# Patient Record
Sex: Female | Born: 1955 | Race: White | Hispanic: No | Marital: Married | Smoking: Current some day smoker
Health system: Southern US, Community
[De-identification: ages and names within clinical notes are randomized; demographics above are authoritative.]

## PROBLEM LIST (undated history)

## (undated) DIAGNOSIS — I10 Essential (primary) hypertension: Secondary | ICD-10-CM

## (undated) DIAGNOSIS — F32A Depression, unspecified: Secondary | ICD-10-CM

## (undated) DIAGNOSIS — E785 Hyperlipidemia, unspecified: Secondary | ICD-10-CM

## (undated) DIAGNOSIS — F419 Anxiety disorder, unspecified: Secondary | ICD-10-CM

## (undated) DIAGNOSIS — E079 Disorder of thyroid, unspecified: Secondary | ICD-10-CM

## (undated) DIAGNOSIS — N6092 Unspecified benign mammary dysplasia of left breast: Secondary | ICD-10-CM

## (undated) DIAGNOSIS — M8580 Other specified disorders of bone density and structure, unspecified site: Secondary | ICD-10-CM

## (undated) DIAGNOSIS — F329 Major depressive disorder, single episode, unspecified: Secondary | ICD-10-CM

## (undated) DIAGNOSIS — C4491 Basal cell carcinoma of skin, unspecified: Secondary | ICD-10-CM

## (undated) DIAGNOSIS — K219 Gastro-esophageal reflux disease without esophagitis: Secondary | ICD-10-CM

## (undated) HISTORY — DX: Essential (primary) hypertension: I10

## (undated) HISTORY — PX: COLONOSCOPY: SHX174

## (undated) HISTORY — DX: Other specified disorders of bone density and structure, unspecified site: M85.80

## (undated) HISTORY — DX: Anxiety disorder, unspecified: F41.9

## (undated) HISTORY — DX: Unspecified benign mammary dysplasia of left breast: N60.92

## (undated) HISTORY — DX: Disorder of thyroid, unspecified: E07.9

## (undated) HISTORY — DX: Hyperlipidemia, unspecified: E78.5

## (undated) HISTORY — DX: Basal cell carcinoma of skin, unspecified: C44.91

## (undated) HISTORY — DX: Depression, unspecified: F32.A

## (undated) HISTORY — DX: Gastro-esophageal reflux disease without esophagitis: K21.9

## (undated) HISTORY — PX: BUNIONECTOMY: SHX129

## (undated) HISTORY — DX: Major depressive disorder, single episode, unspecified: F32.9

---

## 1980-09-23 HISTORY — PX: NASAL SEPTUM SURGERY: SHX37

## 1998-01-31 ENCOUNTER — Other Ambulatory Visit: Admission: RE | Admit: 1998-01-31 | Discharge: 1998-01-31 | Payer: Self-pay | Admitting: *Deleted

## 1999-09-14 ENCOUNTER — Other Ambulatory Visit: Admission: RE | Admit: 1999-09-14 | Discharge: 1999-09-14 | Payer: Self-pay | Admitting: Gynecology

## 2000-11-10 ENCOUNTER — Other Ambulatory Visit: Admission: RE | Admit: 2000-11-10 | Discharge: 2000-11-10 | Payer: Self-pay | Admitting: Gynecology

## 2002-04-20 ENCOUNTER — Encounter: Admission: RE | Admit: 2002-04-20 | Discharge: 2002-04-20 | Payer: Self-pay | Admitting: Orthopedic Surgery

## 2002-04-20 ENCOUNTER — Encounter: Payer: Self-pay | Admitting: Orthopedic Surgery

## 2002-08-09 ENCOUNTER — Other Ambulatory Visit: Admission: RE | Admit: 2002-08-09 | Discharge: 2002-08-09 | Payer: Self-pay | Admitting: Obstetrics and Gynecology

## 2002-11-14 ENCOUNTER — Emergency Department (HOSPITAL_COMMUNITY): Admission: EM | Admit: 2002-11-14 | Discharge: 2002-11-14 | Payer: Self-pay | Admitting: Emergency Medicine

## 2003-08-11 ENCOUNTER — Ambulatory Visit (HOSPITAL_COMMUNITY): Admission: RE | Admit: 2003-08-11 | Discharge: 2003-08-11 | Payer: Self-pay | Admitting: Family Medicine

## 2003-08-22 ENCOUNTER — Encounter: Admission: RE | Admit: 2003-08-22 | Discharge: 2003-08-22 | Payer: Self-pay | Admitting: Family Medicine

## 2003-09-08 ENCOUNTER — Ambulatory Visit (HOSPITAL_COMMUNITY): Admission: RE | Admit: 2003-09-08 | Discharge: 2003-09-08 | Payer: Self-pay | Admitting: Family Medicine

## 2003-09-08 ENCOUNTER — Encounter (INDEPENDENT_AMBULATORY_CARE_PROVIDER_SITE_OTHER): Payer: Self-pay | Admitting: Specialist

## 2004-07-02 ENCOUNTER — Other Ambulatory Visit: Admission: RE | Admit: 2004-07-02 | Discharge: 2004-07-02 | Payer: Self-pay | Admitting: Obstetrics and Gynecology

## 2004-08-02 ENCOUNTER — Encounter: Admission: RE | Admit: 2004-08-02 | Discharge: 2004-08-02 | Payer: Self-pay | Admitting: Otolaryngology

## 2004-12-28 ENCOUNTER — Encounter: Admission: RE | Admit: 2004-12-28 | Discharge: 2004-12-28 | Payer: Self-pay | Admitting: Family Medicine

## 2005-09-04 ENCOUNTER — Other Ambulatory Visit: Admission: RE | Admit: 2005-09-04 | Discharge: 2005-09-04 | Payer: Self-pay | Admitting: Obstetrics and Gynecology

## 2005-09-19 ENCOUNTER — Ambulatory Visit: Payer: Self-pay | Admitting: Internal Medicine

## 2005-09-30 ENCOUNTER — Ambulatory Visit: Payer: Self-pay | Admitting: Internal Medicine

## 2008-09-23 DIAGNOSIS — E079 Disorder of thyroid, unspecified: Secondary | ICD-10-CM

## 2008-09-23 HISTORY — DX: Disorder of thyroid, unspecified: E07.9

## 2009-05-04 ENCOUNTER — Ambulatory Visit: Payer: Self-pay | Admitting: Internal Medicine

## 2009-10-16 ENCOUNTER — Ambulatory Visit: Payer: Self-pay | Admitting: Internal Medicine

## 2009-11-02 ENCOUNTER — Ambulatory Visit: Payer: Self-pay | Admitting: Internal Medicine

## 2010-02-23 ENCOUNTER — Telehealth: Payer: Self-pay | Admitting: Internal Medicine

## 2010-04-02 ENCOUNTER — Ambulatory Visit: Payer: Self-pay | Admitting: Internal Medicine

## 2010-09-19 ENCOUNTER — Encounter (INDEPENDENT_AMBULATORY_CARE_PROVIDER_SITE_OTHER): Payer: Self-pay | Admitting: *Deleted

## 2010-09-28 ENCOUNTER — Encounter (INDEPENDENT_AMBULATORY_CARE_PROVIDER_SITE_OTHER): Payer: Self-pay | Admitting: *Deleted

## 2010-09-28 ENCOUNTER — Ambulatory Visit
Admission: RE | Admit: 2010-09-28 | Discharge: 2010-09-28 | Payer: Self-pay | Source: Home / Self Care | Attending: Internal Medicine | Admitting: Internal Medicine

## 2010-10-04 ENCOUNTER — Ambulatory Visit
Admission: RE | Admit: 2010-10-04 | Discharge: 2010-10-04 | Payer: Self-pay | Source: Home / Self Care | Attending: Internal Medicine | Admitting: Internal Medicine

## 2010-10-11 ENCOUNTER — Encounter: Payer: Self-pay | Admitting: Internal Medicine

## 2010-10-11 ENCOUNTER — Ambulatory Visit
Admission: RE | Admit: 2010-10-11 | Discharge: 2010-10-11 | Payer: Self-pay | Source: Home / Self Care | Attending: Internal Medicine | Admitting: Internal Medicine

## 2010-10-12 LAB — HM COLONOSCOPY

## 2010-10-23 NOTE — Progress Notes (Signed)
Summary: change recall col?  Phone Note Call from Patient Call back at [phone]   Caller: Patient Call For: Dr. Juanda Chance Reason for Call: Talk to Nurse Summary of Call: mother passed from colon cancer in 2009 and pt wants to know if her COL recall date should be moved up from q 5 yrs to q 1-3 yrs Initial call taken by: Vallarie Mare,  February 23, 2010 4:14 PM  Follow-up for Phone Call        Last Colon 09/2005.  Pt. denies any GI c/o at this time.   DR. PLEASE ADVISE  Follow-up by: Laureen Ochs LPN,  February 24, 3243 4:27 PM  Additional Follow-up for Phone Call Additional follow up Details #1::        Pt's last colonoscopy was in 09/2005, if her mother was diagnosed before age 38 then O.K. to do colonoscpy now, but if she was over 60 at the time of diagnosis then pt. needs to wait for 5 years as per guidelines ( the insurance would not cover that) DB    Additional Follow-up for Phone Call Additional follow up Details #2::    Above MD orders reviewed with patient. Pt's mother was in her 81's at time of diagnosis. Pt. will wait until 09/2010 for her recall Colonoscopy. Pt. instructed to call back as needed. (Recall is in IDX for  09/2010)  Follow-up by: Laureen Ochs LPN,  February 26, 101 8:30 AM

## 2010-10-23 NOTE — Procedures (Signed)
Summary: COLONOSCOPY   Colonoscopy  Procedure date:  09/30/2005  Findings:      Location:  [facility].    Patient Name: Robin, Barton MRN:  Procedure Procedures: Colonoscopy CPT: [phone].  Personnel: Endoscopist:  L. Juanda Chance, MD.  Referred By: Mosetta Putt, M.D.  Exam Location: Exam performed in Outpatient Clinic. Outpatient  Patient Consent: Procedure, Alternatives, Risks and Benefits discussed, consent obtained, from patient. Consent was obtained by the RN.  Indications Symptoms: Constipation Patient's stools are infrequent. Change in bowel habits.  Comments: s/p hemorrhoidectomy History  Current Medications: Patient is not currently taking Coumadin.  Pre-Exam Physical: Performed Sep 30, 2005. Entire physical exam was normal.  Comments: Pt. history reviewed/updated, physical exam performed prior to initiation of sedation? Exam Exam: Extent of exam reached: Cecum, extent intended: Cecum.  The cecum was identified by appendiceal orifice and IC valve. Colon retroflexion performed. Images taken. ASA Classification: I. Tolerance: good.  Monitoring: Pulse and BP monitoring, Oximetry used. Supplemental O2 given.  Colon Prep Used Miralax for colon prep. Prep results: good.  Sedation Meds: Patient assessed and found to be appropriate for moderate (conscious) sedation. Fentanyl 75 mcg. given IV. Versed 8 mg. given IV.  Findings - NORMAL EXAM: Cecum.  - NORMAL EXAM: Rectum. Comments: s/p hemorrhoidectomy.   Assessment Normal examination.  Comments: no polyps Events  Unplanned Interventions: No intervention was required.  Unplanned Events: There were no complications. Plans Patient Education: Patient given standard instructions for: Yearly hemoccult testing recommended. Patient instructed to get routine colonoscopy every 10 years.  Disposition: After procedure patient sent to recovery. After recovery patient sent home.    CC: Robin Barton  This report was created from the original endoscopy report, which was reviewed and signed by the above listed endoscopist.

## 2010-10-25 NOTE — Procedures (Signed)
Summary: Colonoscopy  Patient: Robin Barton Note: All result statuses are Final unless otherwise noted.  Tests: (1) Colonoscopy (COL)   COL Colonoscopy           DONE     [facility]     [address]     [place], [place]  [address]           COLONOSCOPY PROCEDURE REPORT           PATIENT:  Robin Barton, Robin Barton  MR#:  [id number]     BIRTHDATE:  [birth year], 54 yrs. old  GENDER:  female     ENDOSCOPIST:  Hedwig Morton. Juanda Chance, MD     REF. BY:  Sharlet Salina, M.D.     PROCEDURE DATE:  10/11/2010     PROCEDURE:  Colonoscopy 19147     ASA CLASS:  Class I     INDICATIONS:  family history of colon cancer mother with colon     cancer     last colon 09/2005     MEDICATIONS:   Versed 10 mg, Fentanyl 100 mcg           DESCRIPTION OF PROCEDURE:   After the risks benefits and     alternatives of the procedure were thoroughly explained, informed     consent was obtained.  Digital rectal exam was performed and     revealed no rectal masses.   The LB [id number]-[id number] endoscope     was introduced through the anus and advanced to the cecum, which     was identified by both the appendix and ileocecal valve, without     limitations.  The quality of the prep was good, using MiraLax.     The instrument was then slowly withdrawn as the colon was fully     examined.     <<PROCEDUREIMAGES>>           FINDINGS:  No polyps or cancers were seen (see image1, image2,     image3, image4, and image5).   Retroflexed views in the rectum     revealed no abnormalities.    The scope was then withdrawn from     the patient and the procedure completed.           COMPLICATIONS:  None     ENDOSCOPIC IMPRESSION:     1) No polyps or cancers     2) Normal colonoscopy     RECOMMENDATIONS:     1) high fiber diet     REPEAT EXAM:  In 5 year(s) for.           ______________________________     Hedwig Morton. Juanda Chance, MD           CC:  Sharlet Salina, M.D.           n.     eSIGNED:   Hedwig Morton.  at  10/11/2010 11:01 AM           Marcelino Duster, [id number]  Note: An exclamation mark (!) indicates a result that was not dispersed into the flowsheet. Document Creation Date: 10/11/2010 11:01 AM _______________________________________________________________________  (1) Order result status: Final Collection or observation date-time: 10/11/2010 10:54 Requested date-time:  Receipt date-time:  Reported date-time:  Referring Physician:   Ordering Physician: Lina Sar [phone]) Specimen Source:  Source: Launa Grill Order Number: [id number] Lab site:   Appended Document: Colonoscopy    Clinical Lists Changes  Observations: Added new observation of COLONNXTDUE: 09/2015 (10/11/2010 13:53)

## 2010-10-25 NOTE — Letter (Signed)
Summary: [facility] Instructions  [facility]  [address] [place], [place] [address]   Phone: [phone]  Fax: [phone]       Robin Barton    [birth year]    MRN: [id number]       Procedure Day /Date: Thursday 10-11-10     Arrival Time:  9:00 am     Procedure Time: 10:00 am     Location of Procedure:                    _ x_  [facility] (4th Floor)    PREPARATION FOR COLONOSCOPY WITH MIRALAX  Starting 5 days prior to your procedure  10-06-10 do not eat nuts, seeds, popcorn, corn, beans, peas,  salads, or any raw vegetables.  Do not take any fiber supplements (e.g. Metamucil, Citrucel, and Benefiber). ____________________________________________________________________________________________________   THE DAY BEFORE YOUR PROCEDURE         DATE:  10-10-10  DAY:  Wednesday  1   Drink clear liquids the entire day-NO SOLID FOOD  2   Do not drink anything colored red or purple.  Avoid juices with pulp.  No orange juice.  3   Drink at least 64 oz. (8 glasses) of fluid/clear liquids during the day to prevent dehydration and help the prep work efficiently.  CLEAR LIQUIDS INCLUDE: Water Jello Ice Popsicles Tea (sugar ok, no milk/cream) Powdered fruit flavored drinks Coffee (sugar ok, no milk/cream) Gatorade Juice: apple, white grape, white cranberry  Lemonade Clear bullion, consomm, broth Carbonated beverages (any kind) Strained chicken noodle soup Hard Candy  4   Mix the entire bottle of Miralax with 64 oz. of Gatorade/Powerade in the morning and put in the refrigerator to chill.  5   At 3:00 pm take 2 Dulcolax/Bisacodyl tablets.  6   At 4:30 pm take one Reglan/Metoclopramide tablet.  7  Starting at 5:00 pm drink one 8 oz glass of the Miralax mixture every 15-20 minutes until you have finished drinking the entire 64 oz.  You should finish drinking prep around 7:30 or 8:00 pm.  8   If you are nauseated, you may take the 2nd  Reglan/Metoclopramide tablet at 6:30 pm.        9    At 8:00 pm take 2 more DULCOLAX/Bisacodyl tablets.     THE DAY OF YOUR PROCEDURE      DATE:  10-11-10  DAY:  Thursday  You may drink clear liquids until  8:00 a.m.  (2 HOURS BEFORE PROCEDURE).   MEDICATION INSTRUCTIONS  Unless otherwise instructed, you should take regular prescription medications with a small sip of water as early as possible the morning of your procedure.  Hold Chlorthalidone morning of procedure only.         OTHER INSTRUCTIONS  You will need a responsible adult at least 57 years of age to accompany you and drive you home.   This person must remain in the waiting room during your procedure.  Wear loose fitting clothing that is easily removed.  Leave jewelry and other valuables at home.  However, you may wish to bring a book to read or an iPod/MP3 player to listen to music as you wait for your procedure to start.  Remove all body piercing jewelry and leave at home.  Total time from sign-in until discharge is approximately 2-3 hours.  You should go home directly after your procedure and rest.  You can resume normal activities the day after your procedure.  The day of your procedure you should not:   Drive   Make legal decisions   Operate machinery   Drink alcohol   Return to work  You will receive specific instructions about eating, activities and medications before you leave.   The above instructions have been reviewed and explained to me by   Sherren Kerns RN  September 28, 2010 11:31 AM    I fully understand and can verbalize these instructions _____________________________ Date _______

## 2010-10-25 NOTE — Miscellaneous (Signed)
Summary: previsit prep/rm  Clinical Lists Changes  Medications: Added new medication of MIRALAX   POWD (POLYETHYLENE GLYCOL 3350) As per prep  instructions. - Signed Added new medication of DULCOLAX 5 MG  TBEC (BISACODYL) Day before procedure take 2 at 3pm and 2 at 8pm. - Signed Added new medication of REGLAN 10 MG  TABS (METOCLOPRAMIDE HCL) As per prep instructions. - Signed Rx of MIRALAX   POWD (POLYETHYLENE GLYCOL 3350) As per prep  instructions.;  #255gm x 0;  Signed;  Entered by: Sherren Kerns RN;  Authorized by: Hart Carwin MD;  Method used: Electronically to [facility] [address] # [id number]*, [address] [place], [place], [place]  [address], Ph: [phone] or [phone], Fax: [phone] Rx of DULCOLAX 5 MG  TBEC (BISACODYL) Day before procedure take 2 at 3pm and 2 at 8pm.;  #4 x 0;  Signed;  Entered by: Sherren Kerns RN;  Authorized by: Hart Carwin MD;  Method used: Electronically to [facility] [address] # [id number]*, [address] [place], [place], [place]  [address], Ph: [phone] or [phone], Fax: [phone] Rx of REGLAN 10 MG  TABS (METOCLOPRAMIDE HCL) As per prep instructions.;  #2 x 0;  Signed;  Entered by: Sherren Kerns RN;  Authorized by: Hart Carwin MD;  Method used: Electronically to [facility] [address] # [id number]*, [address] [place], [place], [place]  [address], Ph: [phone] or [phone], Fax: [phone] Allergies: Added new allergy or adverse reaction of SULFA Observations: Added new observation of ALLERGY REV: Done (09/28/2010 10:51) Added new observation of NKA: F (09/28/2010 10:51)    Prescriptions: REGLAN 10 MG  TABS (METOCLOPRAMIDE HCL) As per prep instructions.  #2 x 0   Entered by:   Sherren Kerns RN   Authorized by:   Hart Carwin MD   Signed by:   Sherren Kerns RN on 09/28/2010   Method used:   Electronically to        [facility] [address] [id number]* (retail)       [address]       [place],  [place]  [address]       Ph: [phone] or [phone]       Fax: [phone]   RxID:   [id number] DULCOLAX 5 MG  TBEC (BISACODYL) Day before procedure take 2 at 3pm and 2 at 8pm.  #4 x 0   Entered by:   Sherren Kerns RN   Authorized by:   Hart Carwin MD   Signed by:   Sherren Kerns RN on 09/28/2010   Method used:   Electronically to        [facility] [address] [id number]* (retail)       [address]       [place], [place]  [address]       Ph: [phone] or [phone]       Fax: [phone]   RxID:   [id number] MIRALAX   POWD (POLYETHYLENE GLYCOL 3350) As per prep  instructions.  #255gm x 0   Entered by:   Sherren Kerns RN   Authorized by:   Hart Carwin MD   Signed by:   Sherren Kerns RN on 09/28/2010   Method used:   Electronically to        [facility] [address] # [id number]* (retail)  [address]       [place], [place]  [address]       Ph: [phone] or [phone]       Fax: [phone]   RxID:   [id number]

## 2010-10-25 NOTE — Letter (Signed)
Summary: Pre Visit Letter Revised  [facility]  [address] [place], [place] [address]   Phone: [phone]  Fax: [phone]        09/19/2010 MRN: [id number] Robin Barton [address] [place], [place]  [address]             Procedure Date:  10/11/2010 @ 10:00   Recall colon-Dr. Juanda Chance   Welcome to the Gastroenterology Division at [facility].    You are scheduled to see a nurse for your pre-procedure visit on 09/28/2010 at 11:00 am on the 3rd floor at [facility] [place], [address]  We ask that you try to arrive at our office 15 minutes prior to your appointment time to allow for check-in.  Please take a minute to review the attached form.  If you answer "Yes" to one or more of the questions on the first page, we ask that you call the person listed at your earliest opportunity.  If you answer "No" to all of the questions, please complete the rest of the form and bring it to your appointment.    Your nurse visit will consist of discussing your medical and surgical history, your immediate family medical history, and your medications.   If you are unable to list all of your medications on the form, please bring the medication bottles to your appointment and we will list them.  We will need to be aware of both prescribed and over the counter drugs.  We will need to know exact dosage information as well.    Please be prepared to read and sign documents such as consent forms, a financial agreement, and acknowledgement forms.  If necessary, and with your consent, a friend or relative is welcome to sit-in on the nurse visit with you.  Please bring your insurance card so that we may make a copy of it.  If your insurance requires a referral to see a specialist, please bring your referral form from your primary care physician.  No co-pay is required for this nurse visit.     If you cannot keep your appointment, please call [phone] to cancel or  reschedule prior to your appointment date.  This allows [place] the opportunity to schedule an appointment for another patient in need of care.    Thank you for choosing [facility] for your medical needs.  We appreciate the opportunity to care for you.  Please visit [place] at our website  to learn more about our practice.  Sincerely, The Gastroenterology Division

## 2010-11-30 ENCOUNTER — Ambulatory Visit (INDEPENDENT_AMBULATORY_CARE_PROVIDER_SITE_OTHER): Payer: PRIVATE HEALTH INSURANCE | Admitting: Internal Medicine

## 2010-11-30 DIAGNOSIS — J029 Acute pharyngitis, unspecified: Secondary | ICD-10-CM

## 2011-04-05 ENCOUNTER — Other Ambulatory Visit: Payer: PRIVATE HEALTH INSURANCE | Admitting: Internal Medicine

## 2011-04-05 DIAGNOSIS — E039 Hypothyroidism, unspecified: Secondary | ICD-10-CM

## 2011-04-05 DIAGNOSIS — Z0000 Encounter for general adult medical examination without abnormal findings: Secondary | ICD-10-CM

## 2011-04-05 DIAGNOSIS — E782 Mixed hyperlipidemia: Secondary | ICD-10-CM

## 2011-04-05 LAB — LIPID PANEL
Cholesterol: 198 mg/dL (ref 0–200)
HDL: 106 mg/dL (ref >39)
LDL Cholesterol: 83 mg/dL (ref 0–99)
Total CHOL/HDL Ratio: 1.9 Ratio
Triglycerides: 47 mg/dL (ref <150)
VLDL: 9 mg/dL (ref 0–40)

## 2011-04-05 LAB — CBC WITH DIFFERENTIAL/PLATELET
Basophils Absolute: 0 10*3/uL (ref 0.0–0.1)
Basophils Relative: 1 % (ref 0–1)
Eosinophils Absolute: 0.1 10*3/uL (ref 0.0–0.7)
Eosinophils Relative: 1 % (ref 0–5)
HCT: 38.9 % (ref 36.0–46.0)
Hemoglobin: 12.9 g/dL (ref 12.0–15.0)
Lymphocytes Relative: 33 % (ref 12–46)
Lymphs Abs: 1.8 10*3/uL (ref 0.7–4.0)
MCH: 31.9 pg (ref 26.0–34.0)
MCHC: 33.2 g/dL (ref 30.0–36.0)
MCV: 96 fL (ref 78.0–100.0)
Monocytes Absolute: 0.7 10*3/uL (ref 0.1–1.0)
Monocytes Relative: 14 % — ABNORMAL HIGH (ref 3–12)
Neutro Abs: 2.7 10*3/uL (ref 1.7–7.7)
Neutrophils Relative %: 51 % (ref 43–77)
Platelets: 375 10*3/uL (ref 150–400)
RBC: 4.05 MIL/uL (ref 3.87–5.11)
RDW: 14.3 % (ref 11.5–15.5)
WBC: 5.3 10*3/uL (ref 4.0–10.5)

## 2011-04-05 LAB — COMPREHENSIVE METABOLIC PANEL
ALT: 18 U/L (ref 0–35)
AST: 23 U/L (ref 0–37)
Albumin: 4.8 g/dL (ref 3.5–5.2)
Alkaline Phosphatase: 48 U/L (ref 39–117)
BUN: 26 mg/dL — ABNORMAL HIGH (ref 6–23)
CO2: 28 mEq/L (ref 19–32)
Calcium: 10.1 mg/dL (ref 8.4–10.5)
Chloride: 101 mEq/L (ref 96–112)
Creat: 0.84 mg/dL (ref 0.50–1.10)
Glucose, Bld: 89 mg/dL (ref 70–99)
Potassium: 4.8 mEq/L (ref 3.5–5.3)
Sodium: 138 mEq/L (ref 135–145)
Total Bilirubin: 0.7 mg/dL (ref 0.3–1.2)
Total Protein: 7.5 g/dL (ref 6.0–8.3)

## 2011-04-05 LAB — VITAMIN D 25 HYDROXY (VIT D DEFICIENCY, FRACTURES): Vit D, 25-Hydroxy: 60 ng/mL (ref 30–89)

## 2011-04-05 LAB — TSH: TSH: 0.847 u[IU]/mL (ref 0.350–4.500)

## 2011-04-08 ENCOUNTER — Ambulatory Visit (INDEPENDENT_AMBULATORY_CARE_PROVIDER_SITE_OTHER): Payer: PRIVATE HEALTH INSURANCE | Admitting: Internal Medicine

## 2011-04-08 ENCOUNTER — Encounter: Payer: Self-pay | Admitting: Internal Medicine

## 2011-04-08 VITALS — BP 118/62 | HR 62 | Temp 97.4°F | Ht 61.0 in | Wt 124.0 lb

## 2011-04-08 DIAGNOSIS — E785 Hyperlipidemia, unspecified: Secondary | ICD-10-CM

## 2011-04-08 DIAGNOSIS — Z800 Family history of malignant neoplasm of digestive organs: Secondary | ICD-10-CM

## 2011-04-08 DIAGNOSIS — M8580 Other specified disorders of bone density and structure, unspecified site: Secondary | ICD-10-CM

## 2011-04-08 DIAGNOSIS — I10 Essential (primary) hypertension: Secondary | ICD-10-CM

## 2011-04-08 DIAGNOSIS — M899 Disorder of bone, unspecified: Secondary | ICD-10-CM

## 2011-04-08 DIAGNOSIS — F329 Major depressive disorder, single episode, unspecified: Secondary | ICD-10-CM

## 2011-04-08 DIAGNOSIS — E041 Nontoxic single thyroid nodule: Secondary | ICD-10-CM

## 2011-04-08 DIAGNOSIS — Z0000 Encounter for general adult medical examination without abnormal findings: Secondary | ICD-10-CM

## 2011-04-08 LAB — POCT URINALYSIS DIPSTICK
Bilirubin, UA: NEGATIVE
Blood, UA: NEGATIVE
Glucose, UA: NEGATIVE
Ketones, UA: NEGATIVE
Leukocytes, UA: NEGATIVE
Nitrite, UA: NEGATIVE
Protein, UA: NEGATIVE
Spec Grav, UA: 1.025
Urobilinogen, UA: NEGATIVE
pH, UA: 5

## 2011-04-15 ENCOUNTER — Other Ambulatory Visit: Payer: PRIVATE HEALTH INSURANCE

## 2011-04-22 ENCOUNTER — Encounter: Payer: Self-pay | Admitting: Internal Medicine

## 2011-04-22 ENCOUNTER — Ambulatory Visit
Admission: RE | Admit: 2011-04-22 | Discharge: 2011-04-22 | Disposition: A | Discharge status: Home / Self Care | Payer: PRIVATE HEALTH INSURANCE | Source: Ambulatory Visit | Attending: Internal Medicine | Admitting: Internal Medicine

## 2011-04-22 DIAGNOSIS — Z800 Family history of malignant neoplasm of digestive organs: Secondary | ICD-10-CM | POA: Insufficient documentation

## 2011-04-22 DIAGNOSIS — E041 Nontoxic single thyroid nodule: Secondary | ICD-10-CM | POA: Insufficient documentation

## 2011-04-22 DIAGNOSIS — Z0000 Encounter for general adult medical examination without abnormal findings: Secondary | ICD-10-CM

## 2011-04-22 DIAGNOSIS — F329 Major depressive disorder, single episode, unspecified: Secondary | ICD-10-CM | POA: Insufficient documentation

## 2011-04-22 DIAGNOSIS — I10 Essential (primary) hypertension: Secondary | ICD-10-CM | POA: Insufficient documentation

## 2011-04-22 DIAGNOSIS — E785 Hyperlipidemia, unspecified: Secondary | ICD-10-CM | POA: Insufficient documentation

## 2011-04-22 DIAGNOSIS — M8580 Other specified disorders of bone density and structure, unspecified site: Secondary | ICD-10-CM | POA: Insufficient documentation

## 2011-04-22 NOTE — Progress Notes (Signed)
  Subjective:    Patient ID: Robin Barton, female    DOB: July 14, 1956, 55 y.o.   MRN: [id number]  HPI 55 year old white female in today for evaluation of medical problems. History of hypertension, was diagnosed in 2005 with a right thyroid nodule thought to be an adenoma. History of hyperlipidemia. History of depression. Patient had colonoscopy by Dr. Lina Sar 2012.Tdap 8/10. Dr. Garrel Ridgel he is GYN physician. Had bone density study March 2011 was seen in March 2012 with sore throat and congestion treated with Zithromax and Hycodan cough syrup. She is allergic to sulfa causes times. No history of serious illnesses accidents or operation. Formerly seen by Dr. Duaine Dredge. Patient works for [employer] here in town.  She is married completed 4 years of college. Does not smoke. Social alcohol consumption consisting of wine. Exercises by walking and working with weights 3-5 times weekly.  Family history father died in a drowning accident. Mother died at age 20 of colon cancer. One sister late 36s with paraplegia. A son and a daughter in their early 67s in good health. Daughter has had some issues with depression.    Review of Systems noncontributory except as above     Objective:   Physical Exam  Vitals reviewed. Constitutional: She is oriented to person, place, and time. She appears well-nourished.  HENT:  Head: Normocephalic and atraumatic.  Right Ear: External ear normal.  Left Ear: External ear normal.  Mouth/Throat: No oropharyngeal exudate.  Eyes: Conjunctivae and EOM are normal. Pupils are equal, round, and reactive to light.  Neck: Neck supple. No JVD present. No thyromegaly present.  Cardiovascular: Normal rate, regular rhythm and normal heart sounds.   Pulmonary/Chest: Effort normal and breath sounds normal. No respiratory distress. She has no wheezes. She has no rales.  Abdominal: Soft. Bowel sounds are normal. She exhibits no distension and no mass. There is no  tenderness. There is no rebound and no guarding.  Musculoskeletal: She exhibits no edema.  Lymphadenopathy:    She has no cervical adenopathy.  Neurological: She is alert and oriented to person, place, and time. She has normal reflexes.  Skin: No rash noted. She is not diaphoretic.  Psychiatric: She has a normal mood and affect. Her behavior is normal. Judgment and thought content normal.          Assessment & Plan:   Hypertension  Hyperlipidemia  Depression  Family history of colon cancer  Osteopenia-patient will continue with calcium 1200 mg daily and vitamin D supplement. Bone density study 2011 showed T score in her left hip -1.3 and an LS spine -1.9  Right thyroid nodule 1.3 cm diagnosed 200 2004 and recheck in 2005. 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 Will recheck thyroid ultrasound.  Patient is to return in 6 months for fasting lipid panel liver functions blood pressure check and office visit

## 2011-05-14 ENCOUNTER — Other Ambulatory Visit: Payer: Self-pay | Admitting: *Deleted

## 2011-05-14 MED ORDER — TRAZODONE HCL 50 MG PO TABS: 50.0000 mg | ORAL_TABLET | Freq: Every day | ORAL | Status: DC

## 2011-05-14 MED ORDER — CHLORTHALIDONE 25 MG PO TABS: 25.0000 mg | ORAL_TABLET | Freq: Every day | ORAL | Status: DC

## 2011-05-14 MED ORDER — PAROXETINE HCL 20 MG PO TABS: 20.0000 mg | ORAL_TABLET | ORAL | Status: DC

## 2011-06-20 ENCOUNTER — Other Ambulatory Visit: Payer: Self-pay

## 2011-06-20 MED ORDER — SIMVASTATIN 80 MG PO TABS: 80.0000 mg | ORAL_TABLET | Freq: Every day | ORAL | Status: DC

## 2011-07-22 ENCOUNTER — Telehealth: Payer: Self-pay

## 2011-07-22 MED ORDER — LISINOPRIL 40 MG PO TABS: 40.0000 mg | ORAL_TABLET | Freq: Every day | ORAL | Status: DC

## 2011-07-22 MED ORDER — ATENOLOL 25 MG PO TABS: 25.0000 mg | ORAL_TABLET | Freq: Every day | ORAL | Status: DC

## 2011-07-22 NOTE — Telephone Encounter (Signed)
rx refilled for 3 months

## 2011-08-05 ENCOUNTER — Encounter: Payer: Self-pay | Admitting: Internal Medicine

## 2011-10-07 ENCOUNTER — Other Ambulatory Visit: Payer: PRIVATE HEALTH INSURANCE | Admitting: Internal Medicine

## 2011-10-08 ENCOUNTER — Other Ambulatory Visit: Payer: PRIVATE HEALTH INSURANCE | Admitting: Internal Medicine

## 2011-10-08 ENCOUNTER — Ambulatory Visit: Payer: PRIVATE HEALTH INSURANCE | Admitting: Internal Medicine

## 2011-10-08 DIAGNOSIS — E785 Hyperlipidemia, unspecified: Secondary | ICD-10-CM

## 2011-10-08 DIAGNOSIS — Z79899 Other long term (current) drug therapy: Secondary | ICD-10-CM

## 2011-10-09 LAB — LIPID PANEL
Cholesterol: 184 mg/dL (ref 0–200)
HDL: 111 mg/dL (ref >39)
LDL Cholesterol: 60 mg/dL (ref 0–99)
Total CHOL/HDL Ratio: 1.7 Ratio
Triglycerides: 66 mg/dL (ref <150)
VLDL: 13 mg/dL (ref 0–40)

## 2011-10-09 LAB — HEPATIC FUNCTION PANEL
ALT: 26 U/L (ref 0–35)
AST: 28 U/L (ref 0–37)
Albumin: 4.8 g/dL (ref 3.5–5.2)
Alkaline Phosphatase: 46 U/L (ref 39–117)
Bilirubin, Direct: 0.1 mg/dL (ref 0.0–0.3)
Indirect Bilirubin: 0.3 mg/dL (ref 0.0–0.9)
Total Bilirubin: 0.4 mg/dL (ref 0.3–1.2)
Total Protein: 7.1 g/dL (ref 6.0–8.3)

## 2011-10-10 ENCOUNTER — Ambulatory Visit: Payer: PRIVATE HEALTH INSURANCE | Admitting: Internal Medicine

## 2011-10-24 ENCOUNTER — Ambulatory Visit (INDEPENDENT_AMBULATORY_CARE_PROVIDER_SITE_OTHER): Payer: PRIVATE HEALTH INSURANCE | Admitting: Internal Medicine

## 2011-10-24 ENCOUNTER — Encounter: Payer: Self-pay | Admitting: Internal Medicine

## 2011-10-24 VITALS — BP 158/94 | HR 80 | Temp 98.9°F | Wt 121.0 lb

## 2011-10-24 DIAGNOSIS — F39 Unspecified mood [affective] disorder: Secondary | ICD-10-CM

## 2011-10-24 DIAGNOSIS — R4586 Emotional lability: Secondary | ICD-10-CM

## 2011-10-24 DIAGNOSIS — E785 Hyperlipidemia, unspecified: Secondary | ICD-10-CM

## 2011-11-06 ENCOUNTER — Encounter: Payer: Self-pay | Admitting: Internal Medicine

## 2011-11-24 ENCOUNTER — Encounter: Payer: Self-pay | Admitting: Internal Medicine

## 2011-11-24 NOTE — Patient Instructions (Signed)
Please give some consideration to psychiatric evaluation. Addendum: Patient called back a few days later and said she would be finding another physician.

## 2011-11-24 NOTE — Progress Notes (Addendum)
  Subjective:    Patient ID: Robin Barton, female    DOB: May 04, 1956, 56 y.o.   MRN: [id number]  HPI 56 year old white female with history of hyperlipidemia in today saying that she is planning to have foot surgery within the next 10 days or so. Received call from patient's friend that she had been acting erratically. Received call from patient's husband about the same sort of behavior. They're quite concerned that she's been showing some mood swings. Patient tells me today that she is unhappy in her marriage and would like to get a divorce. Feels that she has finally made that decision and is pleased with that. I had asked her husband to come in so we could have a discussion but upon learning how she felt about her husband decided a discussion between the 2 of them with myself would not be a good idea. She is unwilling to seek psychiatric treatment. Feels there is nothing really wrong with her. Seems upset with me that I would ask her to postpone foot surgery until we could get her evaluated by a psychiatrist. She doesn't want to do that. I have told her I thought it would be a good idea to postpone that foot surgery and she is rather insistent about it. I attempted to call her podiatrist but could not get in touch with him. She says she has made up her mind about the foot surgery and is adamant about it. Says that I am not going to change her mind. Patient seems a bit hostile. She seems to have ideas that she is going to open the florist. Husband says she has had an idea that she is going to sell art.    Review of Systems     Objective:   Physical Exam not examined. Spent 25 minutes talking with patient. Spent 15 minutes talking with her husband.        Assessment & Plan:  History of hyperlipidemia  Recent onset of mood swings and erratic behavior-? Hypomania  Plan: Patient indicates she is displeased with me may be looking for another doctor.  Addendum: Patient called back a few  days later and said she would be finding another doctor.

## 2011-12-19 ENCOUNTER — Other Ambulatory Visit: Payer: Self-pay | Admitting: Orthopedic Surgery

## 2011-12-26 ENCOUNTER — Encounter (HOSPITAL_BASED_OUTPATIENT_CLINIC_OR_DEPARTMENT_OTHER): Admission: RE | Payer: Self-pay | Source: Ambulatory Visit

## 2011-12-26 ENCOUNTER — Ambulatory Visit (HOSPITAL_BASED_OUTPATIENT_CLINIC_OR_DEPARTMENT_OTHER)
Admission: RE | Admit: 2011-12-26 | Payer: PRIVATE HEALTH INSURANCE | Source: Ambulatory Visit | Admitting: Orthopedic Surgery

## 2011-12-26 SURGERY — CARPAL TUNNEL RELEASE
Anesthesia: General | Laterality: Left

## 2012-01-08 ENCOUNTER — Other Ambulatory Visit: Payer: Self-pay | Admitting: Internal Medicine

## 2012-01-08 NOTE — Telephone Encounter (Signed)
Patient indicated at [facility] Visit January 31st and subsequent follow up phone call a few days later she would be finding another physician. Today received 3 refill requests from pharmacy for statin and 2 antihypertensive medications. These have been denied until we hear from patient.

## 2012-04-01 IMAGING — US US SOFT TISSUE HEAD/NECK
1 series · 13 of 25 positions shown · non-contrast
Comparison: Right thyroid ultrasound dated 08/22/2003 and biopsy
procedure on 09/08/2003.

CLINICAL DATA: Follow-up of multinodular goiter.  Status post prior
ultrasound guided needle aspirate biopsy of a dominant right
thyroid nodule in 2117.  This showed benign cytologic features.

THYROID ULTRASOUND
TECHNIQUE: Ultrasound examination of the thyroid gland and adjacent
soft tissues was performed.

[Series 1: us soft tissue head/neck · 0.07mm/px · 13 of 72 slices shown]
[im 1/72]
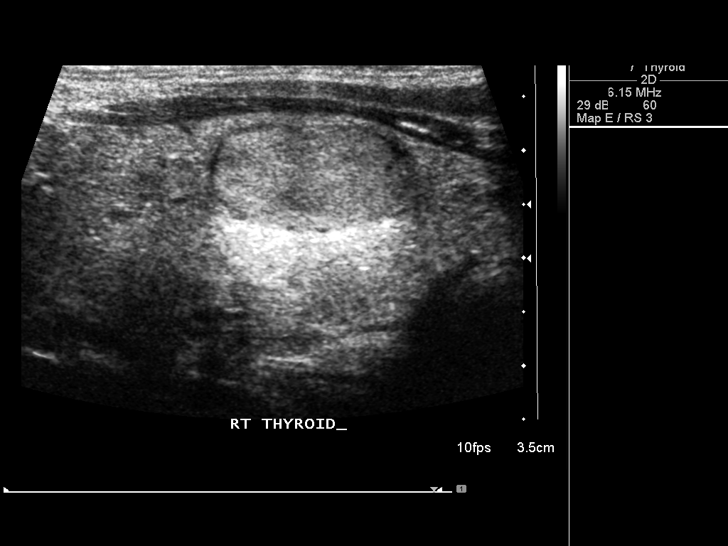
[im 6/72]
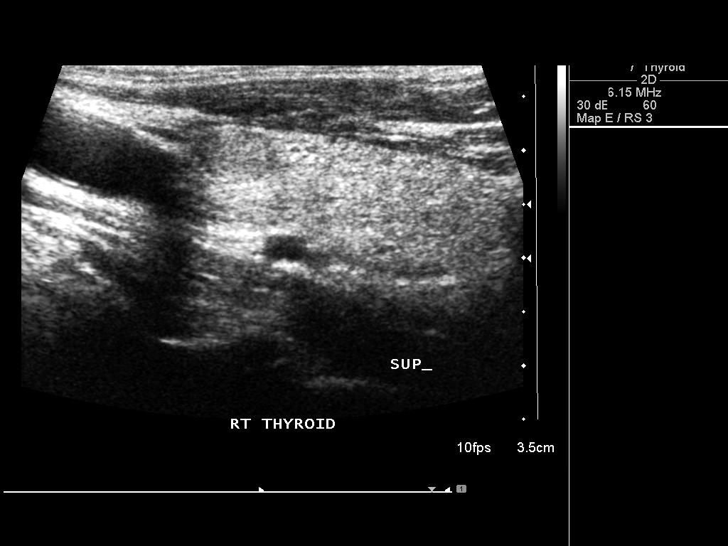
[im 12/72]
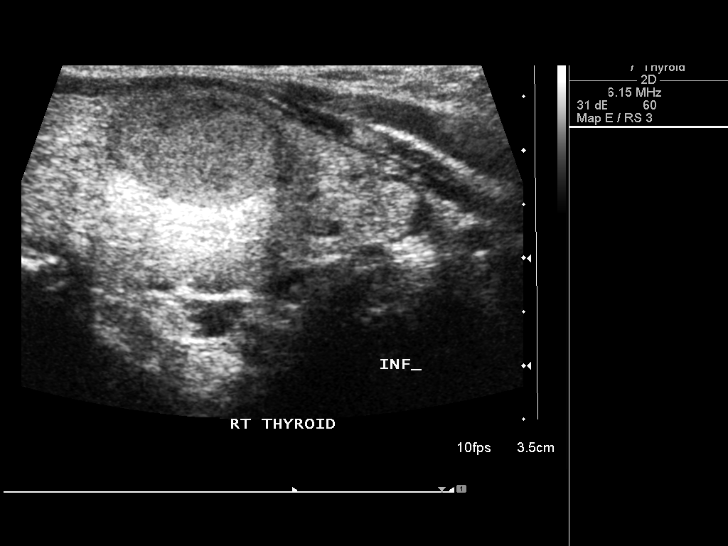
[im 18/72]
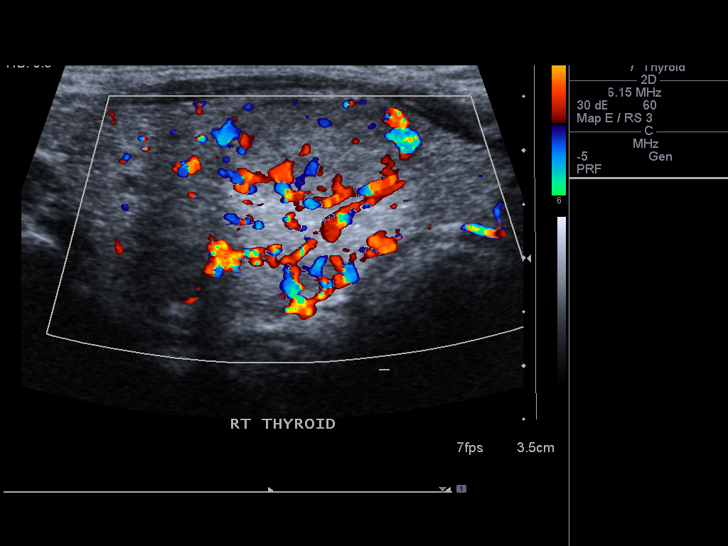
[im 24/72]
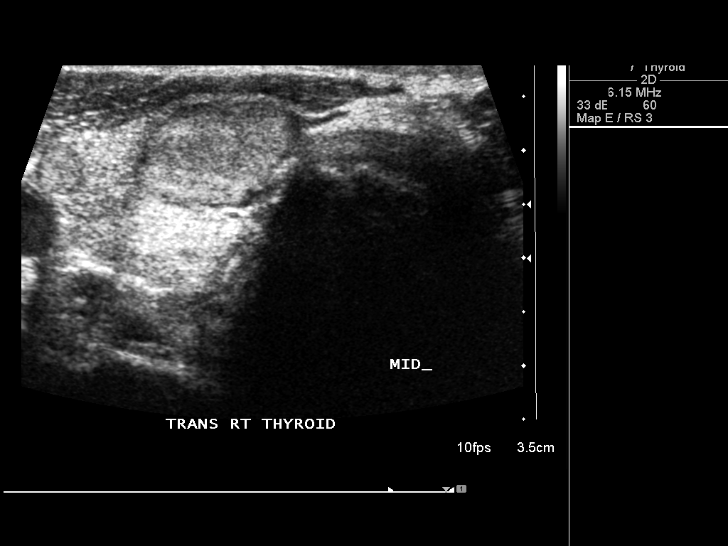
[im 30/72]
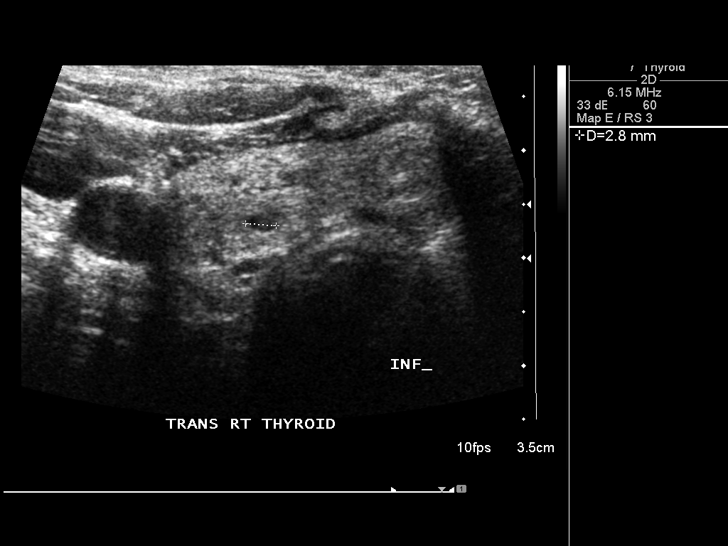
[im 36/72]
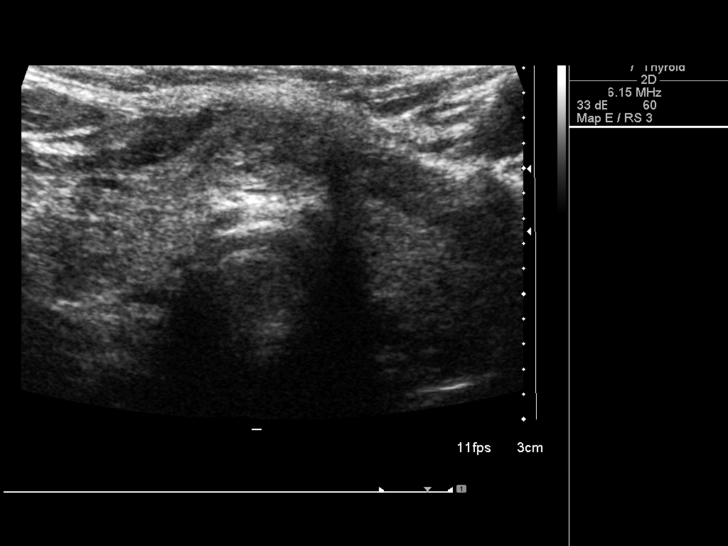
[im 42/72]
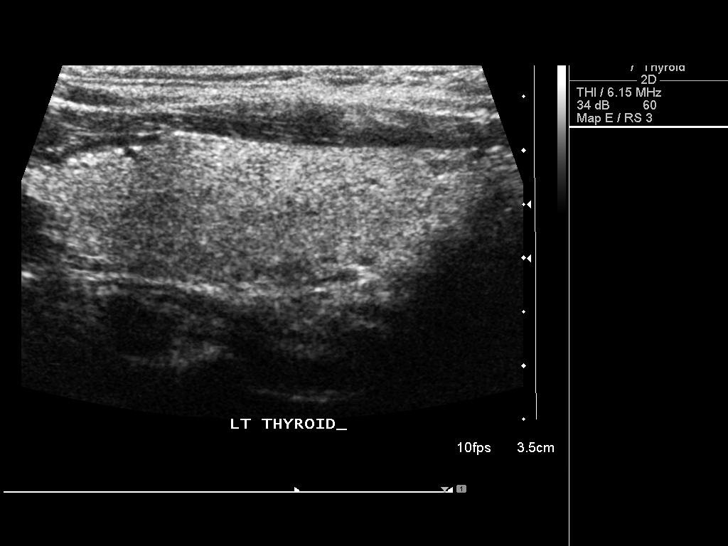
[im 48/72]
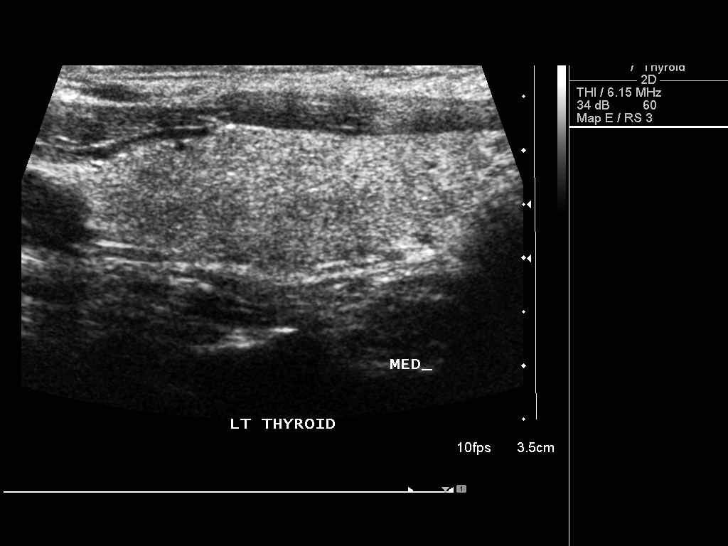
[im 54/72]
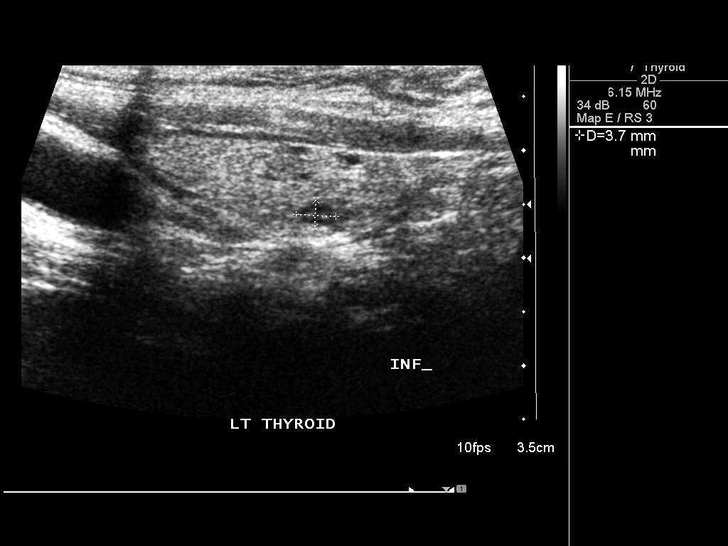
[im 60/72]
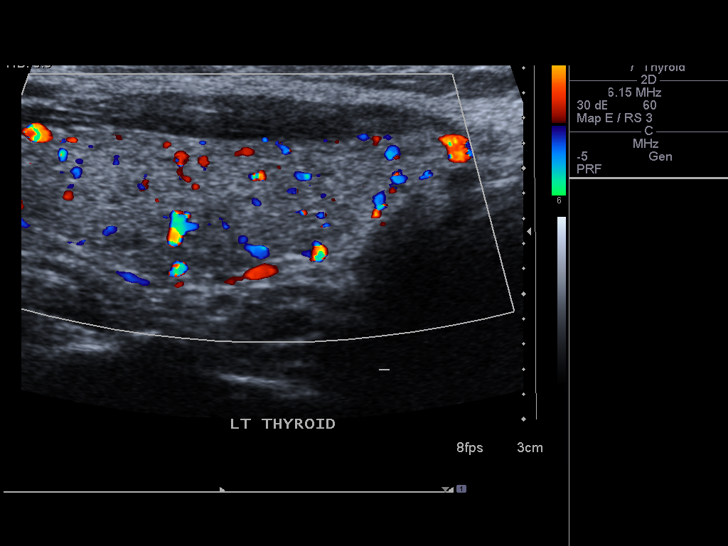
[im 66/72]
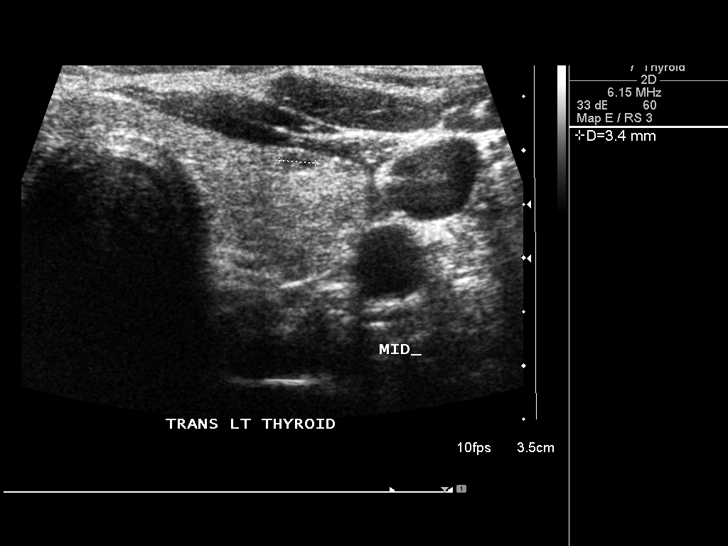
[im 72/72]
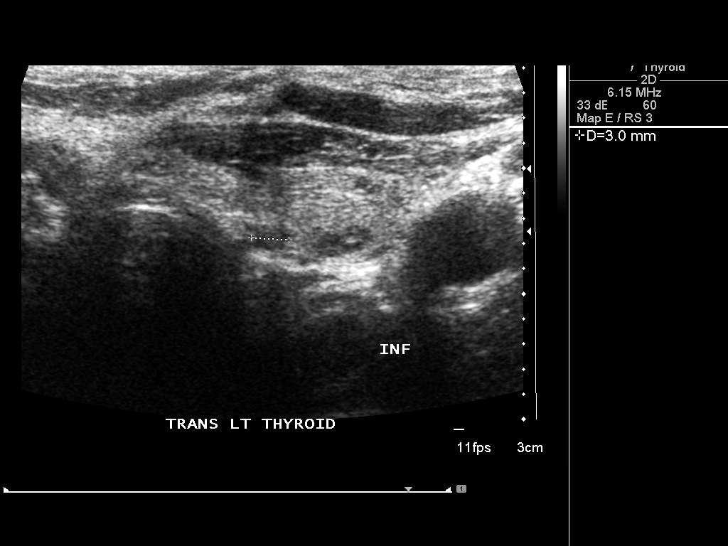

[13 of 25 positions shown; findings below may reference images not displayed]

FINDINGS: Right thyroid lobe:  4.8 x 2.3 x 2.5 cm
Left thyroid lobe:  4.1 x 1.5 x 1.8 cm
Isthmus:  0.6 cm

Overall size of the thyroid gland has reduced slightly in volume
since prior imaging.

Focal nodules:  Multiple bilateral thyroid nodules and cysts are
present.  Dominant ovoid solid nodule in the midportion of the
right lobe measures approximately 1.8 cm in greatest diameter.
This was the same lesion sampled previously measuring 1.3 cm in
2117.  This nodule does not demonstrate internal calcifications or
significant increased vascularity.

Second largest nodule is in the midportion of the isthmus the
measures approximately 1.1 cm in greatest diameter. This nodule
does not contain internal calcifications or increased vascularity.
A multitude of other tiny nodules and cysts measure under 1 cm in
diameter bilaterally.

Lymphadenopathy:  None visualized.
IMPRESSION: Slight growth of the dominant right thyroid nodule previously
sampled.  Multiple other nodules and cysts present consistent with
multinodular goiter.  Given prior benign cytology and lack of other
suspicious features, the dominant nodule can likely be followed by
ultrasound.

## 2012-06-01 ENCOUNTER — Other Ambulatory Visit: Payer: Self-pay | Admitting: Internal Medicine

## 2012-06-04 ENCOUNTER — Ambulatory Visit: Payer: PRIVATE HEALTH INSURANCE | Attending: Obstetrics & Gynecology | Admitting: Physical Therapy

## 2012-06-04 DIAGNOSIS — M545 Low back pain, unspecified: Secondary | ICD-10-CM | POA: Insufficient documentation

## 2012-06-04 DIAGNOSIS — IMO0001 Reserved for inherently not codable concepts without codable children: Secondary | ICD-10-CM | POA: Insufficient documentation

## 2012-06-04 DIAGNOSIS — M2560 Stiffness of unspecified joint, not elsewhere classified: Secondary | ICD-10-CM | POA: Insufficient documentation

## 2012-06-05 ENCOUNTER — Ambulatory Visit: Payer: PRIVATE HEALTH INSURANCE | Admitting: Physical Therapy

## 2012-06-09 ENCOUNTER — Encounter: Payer: PRIVATE HEALTH INSURANCE | Admitting: Physical Therapy

## 2012-06-11 ENCOUNTER — Ambulatory Visit: Payer: PRIVATE HEALTH INSURANCE | Admitting: Physical Therapy

## 2012-06-16 ENCOUNTER — Ambulatory Visit: Payer: PRIVATE HEALTH INSURANCE | Admitting: Physical Therapy

## 2012-06-18 ENCOUNTER — Ambulatory Visit: Payer: PRIVATE HEALTH INSURANCE | Admitting: Physical Therapy

## 2012-06-23 ENCOUNTER — Ambulatory Visit: Payer: No Typology Code available for payment source | Attending: Obstetrics & Gynecology | Admitting: Physical Therapy

## 2012-06-23 DIAGNOSIS — M545 Low back pain, unspecified: Secondary | ICD-10-CM | POA: Insufficient documentation

## 2012-06-23 DIAGNOSIS — M2560 Stiffness of unspecified joint, not elsewhere classified: Secondary | ICD-10-CM | POA: Insufficient documentation

## 2012-06-23 DIAGNOSIS — IMO0001 Reserved for inherently not codable concepts without codable children: Secondary | ICD-10-CM | POA: Insufficient documentation

## 2012-06-25 ENCOUNTER — Ambulatory Visit: Payer: No Typology Code available for payment source | Admitting: Physical Therapy

## 2012-06-30 ENCOUNTER — Ambulatory Visit: Payer: No Typology Code available for payment source | Admitting: Physical Therapy

## 2012-07-02 ENCOUNTER — Ambulatory Visit: Payer: No Typology Code available for payment source | Admitting: Physical Therapy

## 2012-07-07 ENCOUNTER — Ambulatory Visit: Payer: No Typology Code available for payment source | Admitting: Physical Therapy

## 2012-07-09 ENCOUNTER — Ambulatory Visit: Payer: No Typology Code available for payment source | Admitting: Physical Therapy

## 2012-07-14 ENCOUNTER — Ambulatory Visit: Payer: No Typology Code available for payment source | Admitting: Physical Therapy

## 2012-07-16 ENCOUNTER — Ambulatory Visit: Payer: No Typology Code available for payment source | Admitting: Physical Therapy

## 2012-07-21 ENCOUNTER — Ambulatory Visit: Payer: No Typology Code available for payment source | Admitting: Physical Therapy

## 2012-07-23 ENCOUNTER — Ambulatory Visit: Payer: No Typology Code available for payment source | Admitting: Physical Therapy

## 2012-07-28 ENCOUNTER — Ambulatory Visit: Payer: No Typology Code available for payment source | Attending: Obstetrics & Gynecology | Admitting: Physical Therapy

## 2012-07-28 DIAGNOSIS — M545 Low back pain, unspecified: Secondary | ICD-10-CM | POA: Insufficient documentation

## 2012-07-28 DIAGNOSIS — M2560 Stiffness of unspecified joint, not elsewhere classified: Secondary | ICD-10-CM | POA: Insufficient documentation

## 2012-07-28 DIAGNOSIS — IMO0001 Reserved for inherently not codable concepts without codable children: Secondary | ICD-10-CM | POA: Insufficient documentation

## 2012-07-30 ENCOUNTER — Ambulatory Visit: Payer: No Typology Code available for payment source | Admitting: Physical Therapy

## 2012-08-04 ENCOUNTER — Ambulatory Visit: Payer: No Typology Code available for payment source | Admitting: Physical Therapy

## 2012-08-06 ENCOUNTER — Encounter: Payer: PRIVATE HEALTH INSURANCE | Admitting: Physical Therapy

## 2012-08-07 ENCOUNTER — Ambulatory Visit: Payer: No Typology Code available for payment source | Admitting: Physical Therapy

## 2012-08-11 ENCOUNTER — Ambulatory Visit: Payer: No Typology Code available for payment source | Admitting: Physical Therapy

## 2013-06-10 ENCOUNTER — Encounter: Payer: Self-pay | Admitting: Obstetrics & Gynecology

## 2013-06-11 ENCOUNTER — Encounter: Payer: Self-pay | Admitting: Obstetrics & Gynecology

## 2013-06-11 ENCOUNTER — Ambulatory Visit (INDEPENDENT_AMBULATORY_CARE_PROVIDER_SITE_OTHER): Payer: BC Managed Care – PPO | Admitting: Obstetrics & Gynecology

## 2013-06-11 VITALS — BP 120/68 | HR 56 | Resp 16 | Ht 61.25 in | Wt 118.0 lb

## 2013-06-11 DIAGNOSIS — Z01419 Encounter for gynecological examination (general) (routine) without abnormal findings: Secondary | ICD-10-CM

## 2013-06-11 MED ORDER — PAROXETINE HCL 20 MG PO TABS: 20.0000 mg | ORAL_TABLET | ORAL | Status: DC

## 2013-06-11 MED ORDER — IBUPROFEN 800 MG PO TABS: 800.0000 mg | ORAL_TABLET | Freq: Three times a day (TID) | ORAL | Status: DC | PRN

## 2013-06-11 NOTE — Progress Notes (Signed)
Patient ID: JAMELLE NOY, female   DOB: Aug 13, 1956, 57 y.o.   MRN: [id number]  57 y.o. G2P2 MarriedCaucasianF here for annual exam.  Doing well.  No vaginal bleeding.  Did go to urgent care during last year due to spider bite.  Has new PCP.   Lab work done last week.  Seeing her dermatologist every six months.     Patient's last menstrual period was 09/24/1999.          Sexually active: yes  The current method of family planning is none.    Exercising: yes  Home exercise routine includes walking and gardening. Smoker:  yes  Health Maintenance: Pap:  05/20/12, WNL, neg HR HPV History of abnormal Pap:  no MMG:  11/03/12, BI-Rads 1: negative Colonoscopy:  1/12, Dr. Juanda Chance, repeat in 5 years BMD:   06/12/12, normal TDaP:  UTD Screening Labs: PCP, Hb today: PCP, Urine today: PCP   reports that she has been smoking Cigarettes.  She has been smoking about 0.00 packs per day. She has never used smokeless tobacco. She reports that she drinks about 2.5 ounces of alcohol per week. She reports that she does not use illicit drugs.  Past Medical History  Diagnosis Date  . Hypertension   . Thyroid disease     rt thyroid nodule  . Hyperlipidemia   . Osteopenia   . BCC (basal cell carcinoma)   . Acid reflux   . Depression     Past Surgical History  Procedure Laterality Date  . Bunionectomy  2/13 left foot and toe straightened  . Bunionectomy  3/13 right foot and toe straightened  . Nasal septum surgery  1982    Current Outpatient Prescriptions  Medication Sig Dispense Refill  . atenolol (TENORMIN) 25 MG tablet Take 1 tablet (25 mg total) by mouth daily.  30 tablet  2  . calcium carbonate (OS-CAL - DOSED IN MG OF ELEMENTAL CALCIUM) 1250 MG tablet Take 1 tablet by mouth daily.        . Cholecalciferol (VITAMIN D) 1000 UNITS capsule Take 1,000 Units by mouth daily.        Marland Kitchen ibuprofen (ADVIL,MOTRIN) 800 MG tablet Take 800 mg by mouth every 8 (eight) hours as needed for pain.      Marland Kitchen  lisinopril (PRINIVIL,ZESTRIL) 40 MG tablet Take 1 tablet (40 mg total) by mouth daily.  30 tablet  2  . PARoxetine (PAXIL) 20 MG tablet Take 1 tablet (20 mg total) by mouth every morning.  30 tablet  PRN  . simvastatin (ZOCOR) 40 MG tablet Take 40 mg by mouth every evening.      . traZODone (DESYREL) 50 MG tablet Take 1 tablet (50 mg total) by mouth at bedtime.  30 tablet  PRN   No current facility-administered medications for this visit.    Family History  Problem Relation Age of Onset  . Cancer Mother     colon-stage 4  . Diabetes Sister     borderline  . Breast cancer Sister 63    double mastectomy  . Depression Sister   . Hypertension Mother   . Hypertension Sister   . Angina Maternal Grandfather   . Osteoporosis Sister     ROS:  Pertinent items are noted in HPI.  Otherwise, a comprehensive ROS was negative.  Exam:   BP 120/68  Pulse 56  Resp 16  Ht 5' 1.25" (1.556 m)  Wt 118 lb (53.524 kg)  BMI 22.11 kg/m2  LMP 09/24/1999  Weight change:-1lbs  Height: 5' 1.25" (155.6 cm)  Ht Readings from Last 3 Encounters:  06/11/13 5' 1.25" (1.556 m)  04/08/11 5\' 1"  (1.549 m)    General appearance: alert, cooperative and appears stated age Head: Normocephalic, without obvious abnormality, atraumatic Neck: no adenopathy, supple, symmetrical, trachea midline and thyroid normal to inspection and palpation Lungs: clear to auscultation bilaterally Breasts: normal appearance, no masses or tenderness Heart: regular rate and rhythm Abdomen: soft, non-tender; bowel sounds normal; no masses,  no organomegaly Extremities: extremities normal, atraumatic, no cyanosis or edema Skin: Skin color, texture, turgor normal. No rashes or lesions Lymph nodes: Cervical, supraclavicular, and axillary nodes normal. No abnormal inguinal nodes palpated Neurologic: Grossly normal   Pelvic: External genitalia:  no lesions              Urethra:  normal appearing urethra with no masses, tenderness or  lesions              Bartholins and Skenes: normal                 Vagina: normal appearing vagina with normal color and discharge, no lesions              Cervix: no lesions              Pap taken: no Bimanual Exam:  Uterus:  normal size, contour, position, consistency, mobility, non-tender              Adnexa: normal adnexa and no mass, fullness, tenderness               Rectovaginal: Confirms               Anus:  normal sphincter tone, no lesions  A:  Well Woman with normal exam PMP, no HRT Hypertension H/O BCC of skin  P:   Mammogram yearly.  Did 3D 2/14. Paxil rx to pharmacy for year Ibuprofen 800 mg every 8 hrs prn #30 RF pap smear with neg HR HPV 8/13.  No Pap today. Labs with PCP Sees dermatology every six months Will call PT for TENS unit and have request faxed there return annually or prn  An After Visit Summary was printed and given to the patient.

## 2013-06-11 NOTE — Patient Instructions (Addendum)

## 2013-11-25 ENCOUNTER — Other Ambulatory Visit: Payer: Self-pay | Admitting: Radiology

## 2013-11-25 DIAGNOSIS — N6092 Unspecified benign mammary dysplasia of left breast: Secondary | ICD-10-CM

## 2013-11-25 HISTORY — DX: Unspecified benign mammary dysplasia of left breast: N60.92

## 2013-12-06 ENCOUNTER — Encounter (INDEPENDENT_AMBULATORY_CARE_PROVIDER_SITE_OTHER): Payer: Self-pay | Admitting: General Surgery

## 2013-12-06 ENCOUNTER — Ambulatory Visit (INDEPENDENT_AMBULATORY_CARE_PROVIDER_SITE_OTHER): Payer: BC Managed Care – PPO | Admitting: General Surgery

## 2013-12-06 VITALS — BP 118/62 | HR 66 | Temp 98.3°F | Resp 12 | Ht 62.0 in | Wt 122.4 lb

## 2013-12-06 DIAGNOSIS — R928 Other abnormal and inconclusive findings on diagnostic imaging of breast: Secondary | ICD-10-CM

## 2013-12-06 NOTE — Patient Instructions (Signed)
The biopsy of the density in her left breast at the 1:00 position shows lobular neoplasm, also known as atypical lobular neoplasia and some calcifications. This is probably not cancer but he is a high-risk finding. There is a small risk that there could be non-invasive cancer present.  You will be scheduled for a left lumpectomy with radioactive seed localization, as we discussed.     Lumpectomy A lumpectomy is a form of "breast conserving" or "breast preservation" surgery. It may also be referred to as a partial mastectomy. During a lumpectomy, the portion of the breast that contains the cancerous tumor or breast mass (the lump) is removed. Some normal tissue around the lump may also be removed to make sure all the tumor has been removed. This surgery should take 40 minutes or less. LET [facility] CARE PROVIDER KNOW ABOUT:  Any allergies you have.  All medicines you are taking, including vitamins, herbs, eye drops, creams, and over-the-counter medicines.  Previous problems you or members of your family have had with the use of anesthetics.  Any blood disorders you have.  Previous surgeries you have had.  Medical conditions you have. RISKS AND COMPLICATIONS Generally, this is a safe procedure. However, as with any procedure, complications can occur. Possible complications include:  Bleeding.  Infection.  Pain.  Temporary swelling.  Change in the shape of the breast, particularly if a large portion is removed. BEFORE THE PROCEDURE  Ask your health care provider about changing or stopping your regular medicines.  Do not eat or drink anything for 7 8 hours before the surgery or as directed by your health care provider. Ask your health care provider if you can take a sip of water with any approved medicines.  On the day of surgery, your healthcare provider will use a mammogram or ultrasound to locate and mark the tumor in your breast. These markings on your breast will show where  the cut (incision) will be made. PROCEDURE   An IV tube will be put into one of your veins.  You may be given medicine to help you relax before the surgery (sedative). You will be given one of the following:  A medicine that numbs the area (local anesthesia).  A medicine that makes you go to sleep (general anesthesia).  Your health care provider will use a kind of electric scalpel that uses heat to minimize bleeding (electrocautery knife).  A curved incision (like a smile or frown) that follows the natural curve of your breast is made, to allow for minimal scarring and better healing.  The tumor will be removed with some of the surrounding tissue. This will be sent to the lab for analysis. Your health care provider may also remove your lymph nodes at this time if needed.  Sometimes, but not always, a rubber tube called a drain will be surgically inserted into your breast area or armpit to collect excess fluid that may accumulate in the space where the tumor was. This drain is connected to a plastic bulb on the outside of your body. This drain creates suction to help remove the fluid.  The incisions will be closed with stitches (sutures).  A bandage may be placed over the incisions. AFTER THE PROCEDURE  You will be taken to the recovery area.  You will be given medicine for pain.  A small rubber drain may be placed in the breast for 2 3 days to prevent a collection of blood (hematoma) from developing in the breast. You will  be given instructions on caring for the drain before you go home.  A pressure bandage (dressing) will be applied for 1 2 days to prevent bleeding. Ask your health care provider how to care for your bandage at home. Document Released: 10/21/2006 Document Revised: 05/12/2013 Document Reviewed: 02/12/2013 [facility] Patient Information 2014 [place].

## 2013-12-06 NOTE — Progress Notes (Signed)
Patient ID: Robin Barton, female   DOB: 1956/04/17, 58 y.o.   MRN: [id number]  No chief complaint on file.   HPI Robin Barton is a 58 y.o. female.  She is referred by Dr. Gabriel Rainwater a solus mammography or valuation surgical management of abnormal mammogram of the left breast with a biopsy that shows atypical lobular neoplasia. Her PCP is Dr. Velna Hatchet at [place].  The patient has no prior history of breast problems. She simply went for screening mammography and also had a diagnostic left breast mammogram and left ultrasound. This revealed a 5 mm density left breast at the 10:00 position anteriorly and they saw a second density, 9 mm in diameter left breast 1:00 position, posteriorly 5 cm from the nipple. The axilla was negative.  They biopsied both areas. The 10:00 biopsy was benign breast tissue, she states they told her it was a cyst which resolved. The 1:00 position biopsy, however, shows lobular neoplasm, or atypical lobular neoplasia with calcifications. She was referred for surgical management.  Comorbidities include hypertension, hyperlipidemia, minimal depression, thyroid nodule.  History reveals breast cancer in a sister, early diagnosis. Colon cancer in the mother. No history of ovarian cancer in the family. HPI  Past Medical History  Diagnosis Date  . Hypertension   . Thyroid disease     rt thyroid nodule  . Hyperlipidemia   . Osteopenia   . BCC (basal cell carcinoma)   . Acid reflux   . Depression     Past Surgical History  Procedure Laterality Date  . Bunionectomy  2/13 left foot and toe straightened  . Bunionectomy  3/13 right foot and toe straightened  . Nasal septum surgery  1982    Family History  Problem Relation Age of Onset  . Cancer Mother     colon-stage 4  . Diabetes Sister     borderline  . Breast cancer Sister 57    double mastectomy  . Depression Sister   . Hypertension Mother   . Hypertension Sister   . Angina  Maternal Grandfather   . Osteoporosis Sister     Social History History  Substance Use Topics  . Smoking status: Current Some Day Smoker    Types: Cigarettes    Last Attempt to Quit: 09/23/2002  . Smokeless tobacco: Never Used  . Alcohol Use: 2.5 - 3 oz/week    5-6 drink(s) per week    Allergies  Allergen Reactions  . Sulfonamide Derivatives     REACTION: hives    Current Outpatient Prescriptions  Medication Sig Dispense Refill  . atenolol (TENORMIN) 25 MG tablet Take 1 tablet (25 mg total) by mouth daily.  30 tablet  2  . calcium carbonate (OS-CAL - DOSED IN MG OF ELEMENTAL CALCIUM) 1250 MG tablet Take 1 tablet by mouth daily.        Marland Kitchen ibuprofen (ADVIL,MOTRIN) 800 MG tablet Take 1 tablet (800 mg total) by mouth every 8 (eight) hours as needed for pain.  30 tablet  5  . lisinopril (PRINIVIL,ZESTRIL) 40 MG tablet Take 1 tablet (40 mg total) by mouth daily.  30 tablet  2  . PARoxetine (PAXIL) 20 MG tablet Take 1 tablet (20 mg total) by mouth every morning.  30 tablet  13  . simvastatin (ZOCOR) 40 MG tablet Take 40 mg by mouth every evening.      . traZODone (DESYREL) 50 MG tablet Take 1 tablet (50 mg total) by mouth at bedtime.  [place]  tablet  PRN  . Cholecalciferol (VITAMIN D) 1000 UNITS capsule Take 1,000 Units by mouth daily.         No current facility-administered medications for this visit.    Review of Systems Review of Systems  Constitutional: Negative for fever, chills and unexpected weight change.  HENT: Negative for congestion, hearing loss, sore throat, trouble swallowing and voice change.   Eyes: Negative for visual disturbance.  Respiratory: Negative for cough and wheezing.   Cardiovascular: Negative for chest pain, palpitations and leg swelling.  Gastrointestinal: Negative for nausea, vomiting, abdominal pain, diarrhea, constipation, blood in stool, abdominal distention and anal bleeding.  Genitourinary: Negative for hematuria, vaginal bleeding and difficulty  urinating.  Musculoskeletal: Negative for arthralgias.  Skin: Negative for rash and wound.  Neurological: Negative for seizures, syncope and headaches.  Hematological: Negative for adenopathy. Does not bruise/bleed easily.  Psychiatric/Behavioral: Negative for confusion.    Blood pressure 118/62, pulse 66, temperature 98.3 F (36.8 C), temperature source Oral, resp. rate 12, height 5\' 2"  (1.575 m), weight 122 lb 6.4 oz (55.52 kg), last menstrual period 09/24/1999.  Physical Exam Physical Exam  Constitutional: She is oriented to person, place, and time. She appears well-developed and well-nourished. No distress.  HENT:  Head: Normocephalic and atraumatic.  Nose: Nose normal.  Mouth/Throat: No oropharyngeal exudate.  Eyes: Conjunctivae and EOM are normal. Pupils are equal, round, and reactive to light. Left eye exhibits no discharge. No scleral icterus.  Neck: Neck supple. No JVD present. No tracheal deviation present. No thyromegaly present.  Cardiovascular: Normal rate, regular rhythm, normal heart sounds and intact distal pulses.   No murmur heard. Pulmonary/Chest: Effort normal and breath sounds normal. No respiratory distress. She has no wheezes. She has no rales. She exhibits no tenderness.  Breasts are fairly small. Bruise and palpable density left breast, upper outer quadrant. Suspect hematoma. 2 biopsy sites.no other mass or skin change in either breast. No axillary adenopathy.  Abdominal: Soft. Bowel sounds are normal. She exhibits no distension and no mass. There is no tenderness. There is no rebound and no guarding.  Musculoskeletal: She exhibits no edema and no tenderness.  Lymphadenopathy:    She has no cervical adenopathy.  Neurological: She is alert and oriented to person, place, and time. She exhibits normal muscle tone. Coordination normal.  Skin: Skin is warm. No rash noted. She is not diaphoretic. No erythema. No pallor.  Psychiatric: She has a normal mood and affect.  Her behavior is normal. Judgment and thought content normal.    Data Reviewed Numerous imaging studies and reports. Pathology report.  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  Mild depression, on Paxil and trazodone  Hypertension  Hyperlipidemia  Family history breast cancer in her sister     Plan    I spent a long time discussing this with the patient and her husband. I told her that this was most likely a high-risk finding but that there was a small chance that there could be noninvasive cancer in this area of the left breast. I offered to conservatively excise this with a radioactive seed localization technique. She very much wants to do that.  We talked about what we would do if there was no cancer, with specific reference to referral to a high risk clinic here in [place].  I told her there was a small chance that this might be a cancer and she might require further surgery or further treatments. Hopefully that will  not be the case.  She'll be scheduled for left lumpectomy with margin assessment and radioactive seed localization  I discussed the indications,  details, techniques, and numerous risks of the surgery with the patient and her husband. She is aware of the risk of bleeding, infection, cosmetic deformity, reoperation for positive margins if cancer, nerve damage, cardiac, pulmonary, and thromboembolic problems. She understands all these issues. All of her questions are answered. She agrees with this plan.        Edsel Petrin. Dalbert Batman, M.D., [facility] General and Minimally invasive Surgery Breast and Colorectal Surgery Office:   [phone] Pager:   [phone]  12/06/2013, 5:02 PM

## 2013-12-07 ENCOUNTER — Encounter (HOSPITAL_BASED_OUTPATIENT_CLINIC_OR_DEPARTMENT_OTHER): Payer: Self-pay | Admitting: *Deleted

## 2013-12-07 NOTE — Progress Notes (Signed)
To come in for [id number]-no cardiac or resp problems

## 2013-12-08 ENCOUNTER — Other Ambulatory Visit: Payer: Self-pay

## 2013-12-08 ENCOUNTER — Encounter (HOSPITAL_BASED_OUTPATIENT_CLINIC_OR_DEPARTMENT_OTHER)
Admission: RE | Admit: 2013-12-08 | Discharge: 2013-12-08 | Disposition: A | Discharge status: Home / Self Care | Payer: BC Managed Care – PPO | Source: Ambulatory Visit | Attending: General Surgery | Admitting: General Surgery

## 2013-12-08 ENCOUNTER — Telehealth (INDEPENDENT_AMBULATORY_CARE_PROVIDER_SITE_OTHER): Payer: Self-pay | Admitting: *Deleted

## 2013-12-08 LAB — BASIC METABOLIC PANEL
BUN: 17 mg/dL (ref 6–23)
CO2: 27 meq/L (ref 19–32)
Calcium: 9.8 mg/dL (ref 8.4–10.5)
Chloride: 102 mEq/L (ref 96–112)
Creatinine, Ser: 0.67 mg/dL (ref 0.50–1.10)
GFR calc Af Amer: >90 mL/min (ref >90)
GLUCOSE: 104 mg/dL — AB (ref 70–99)
POTASSIUM: 4.7 meq/L (ref 3.7–5.3)
Sodium: 140 mEq/L (ref 137–147)

## 2013-12-08 NOTE — Telephone Encounter (Signed)
Patient called back and was given below message.  Patient agreeable at this time.

## 2013-12-08 NOTE — Telephone Encounter (Signed)
Left message for pt to return call.  Please advise pt that her p/o appt is scheduled with Dr. Dalbert Batman for 12/27/13 and to arrive at 3:00 for a 3:15 appt.Robin Barton

## 2013-12-09 NOTE — H&P (Signed)
Robin Barton   MRN:  [id number]   Description: 58 year old female  Provider: [name], MD  Department: [facility]           Diagnoses      Abnormal mammogram    -  Primary      793.80                Current Vitals - Last Recorded      BP Pulse Temp(Src) Resp Ht Wt      118/62 66 98.3 F (36.8 C) (Oral) 12 5\' 2"  (1.575 m) 122 lb 6.4 oz (55.52 kg)       BMI 22.38 kg/m2 09/24/1999                   History and Physical   [name], MD    Status: Signed            Patient ID: Robin Barton, female   DOB: [birth year], 58 y.o.   MRN: [id number]            HPI Robin Barton is a 58 y.o. female.  She is referred by Dr. Gabriel Rainwater a solus mammography or valuation surgical management of abnormal mammogram of the left breast with a biopsy that shows atypical lobular neoplasia. Her PCP is Dr. Velna Hatchet at [facility].   The patient has no prior history of breast problems. She simply went for screening mammography and also had a diagnostic left breast mammogram and left ultrasound. This revealed a 5 mm density left breast at the 10:00 position anteriorly and they saw a second density, 9 mm in diameter left breast 1:00 position, posteriorly 5 cm from the nipple. The axilla was negative.   They biopsied both areas. The 10:00 biopsy was benign breast tissue, she states they told her it was a cyst which resolved. The 1:00 position biopsy, however, shows lobular neoplasm, or atypical lobular neoplasia with calcifications. She was referred for surgical management.   Comorbidities include hypertension, hyperlipidemia, minimal depression, thyroid nodule.   History reveals breast cancer in a sister, early diagnosis. Colon cancer in the mother. No history of ovarian cancer in the family.         Past Medical History   Diagnosis  Date   .  Hypertension     .  Thyroid disease         rt thyroid nodule   .   Hyperlipidemia     .  Osteopenia     .  BCC (basal cell carcinoma)     .  Acid reflux     .  Depression           Past Surgical History   Procedure  Laterality  Date   .  Bunionectomy    2/13 left foot and toe straightened   .  Bunionectomy    3/13 right foot and toe straightened   .  Nasal septum surgery    1982         Family History   Problem  Relation  Age of Onset   .  Cancer  Mother         colon-stage 4   .  Diabetes  Sister         borderline   .  Breast cancer  Sister  53       double mastectomy   .  Depression  Sister     .  Hypertension  Mother     .  Hypertension  Sister     .  Angina  Maternal Grandfather     .  Osteoporosis  Sister          Social History History   Substance Use Topics   .  Smoking status:  Current Some Day Smoker       Types:  Cigarettes       Last Attempt to Quit:  09/23/2002   .  Smokeless tobacco:  Never Used   .  Alcohol Use:  2.5 - 3 oz/week       5-6 drink(s) per week         Allergies   Allergen  Reactions   .  Sulfonamide Derivatives         REACTION: hives         Current Outpatient Prescriptions   Medication  Sig  Dispense  Refill   .  atenolol (TENORMIN) 25 MG tablet  Take 1 tablet (25 mg total) by mouth daily.   30 tablet   2   .  calcium carbonate (OS-CAL - DOSED IN MG OF ELEMENTAL CALCIUM) 1250 MG tablet  Take 1 tablet by mouth daily.           Marland Kitchen  ibuprofen (ADVIL,MOTRIN) 800 MG tablet  Take 1 tablet (800 mg total) by mouth every 8 (eight) hours as needed for pain.   30 tablet   5   .  lisinopril (PRINIVIL,ZESTRIL) 40 MG tablet  Take 1 tablet (40 mg total) by mouth daily.   30 tablet   2   .  PARoxetine (PAXIL) 20 MG tablet  Take 1 tablet (20 mg total) by mouth every morning.   30 tablet   13   .  simvastatin (ZOCOR) 40 MG tablet  Take 40 mg by mouth every evening.         .  traZODone (DESYREL) 50 MG tablet  Take 1 tablet (50 mg total) by mouth at bedtime.   30 tablet   PRN   .  Cholecalciferol (VITAMIN  D) 1000 UNITS capsule  Take 1,000 Units by mouth daily.                   Review of Systems  Constitutional: Negative for fever, chills and unexpected weight change.  HENT: Negative for congestion, hearing loss, sore throat, trouble swallowing and voice change.   Eyes: Negative for visual disturbance.  Respiratory: Negative for cough and wheezing.   Cardiovascular: Negative for chest pain, palpitations and leg swelling.  Gastrointestinal: Negative for nausea, vomiting, abdominal pain, diarrhea, constipation, blood in stool, abdominal distention and anal bleeding.  Genitourinary: Negative for hematuria, vaginal bleeding and difficulty urinating.  Musculoskeletal: Negative for arthralgias.  Skin: Negative for rash and wound.  Neurological: Negative for seizures, syncope and headaches.  Hematological: Negative for adenopathy. Does not bruise/bleed easily.  Psychiatric/Behavioral: Negative for confusion.      Blood pressure 118/62, pulse 66, temperature 98.3 F (36.8 C), temperature source Oral, resp. rate 12, height 5\' 2"  (1.575 m), weight 122 lb 6.4 oz (55.52 kg), last menstrual period 09/24/1999.   Physical Exam  Constitutional: She is oriented to person, place, and time. She appears well-developed and well-nourished. No distress.  HENT:   Head: Normocephalic and atraumatic.   Nose: Nose normal.   Mouth/Throat: No oropharyngeal exudate.  Eyes: Conjunctivae and EOM are normal. Pupils are equal, round, and reactive to light. Left eye exhibits no  discharge. No scleral icterus.  Neck: Neck supple. No JVD present. No tracheal deviation present. No thyromegaly present.  Cardiovascular: Normal rate, regular rhythm, normal heart sounds and intact distal pulses.    No murmur heard. Pulmonary/Chest: Effort normal and breath sounds normal. No respiratory distress. She has no wheezes. She has no rales. She exhibits no tenderness.  Breasts are fairly small. Bruise and palpable density left  breast, upper outer quadrant. Suspect hematoma. 2 biopsy sites.no other mass or skin change in either breast. No axillary adenopathy.  Abdominal: Soft. Bowel sounds are normal. She exhibits no distension and no mass. There is no tenderness. There is no rebound and no guarding.  Musculoskeletal: She exhibits no edema and no tenderness.  Lymphadenopathy:    She has no cervical adenopathy.  Neurological: She is alert and oriented to person, place, and time. She exhibits normal muscle tone. Coordination normal.  Skin: Skin is warm. No rash noted. She is not diaphoretic. No erythema. No pallor.  Psychiatric: She has a normal mood and affect. Her behavior is normal. Judgment and thought content normal.     Data Reviewed Numerous imaging studies and reports. Pathology report.    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   Mild depression, on Paxil and trazodone   Hypertension   Hyperlipidemia   Family history breast cancer in her sister      Plan    I spent a long time discussing this with the patient and her husband. I told her that this was most likely a high-risk finding but that there was a small chance that there could be noninvasive cancer in this area of the left breast. I offered to conservatively excise this with a radioactive seed localization technique. She very much wants to do that.   We talked about what we would do if there was no cancer, with specific reference to referral to a high risk clinic here in [place].   I told her there was a small chance that this might be a cancer and she might require further surgery or further treatments. Hopefully that will not be the case.   She'll be scheduled for left lumpectomy with margin assessment and radioactive seed localization   I discussed the indications,  details, techniques, and numerous risks of the surgery with the patient and her husband. She is aware of the risk  of bleeding, infection, cosmetic deformity, reoperation for positive margins if cancer, nerve damage, cardiac, pulmonary, and thromboembolic problems. She understands all these issues. All of her questions are answered. She agrees with this plan.          Edsel Petrin. Dalbert Batman, M.D., [facility] General and Minimally invasive Surgery Breast and Colorectal Surgery Office:   [phone] Pager:   [phone]

## 2013-12-10 ENCOUNTER — Encounter (HOSPITAL_BASED_OUTPATIENT_CLINIC_OR_DEPARTMENT_OTHER)
Admission: RE | Disposition: A | Discharge status: Home / Self Care | Payer: Self-pay | Source: Ambulatory Visit | Attending: General Surgery

## 2013-12-10 ENCOUNTER — Encounter (HOSPITAL_BASED_OUTPATIENT_CLINIC_OR_DEPARTMENT_OTHER): Payer: Self-pay | Admitting: *Deleted

## 2013-12-10 ENCOUNTER — Encounter (HOSPITAL_BASED_OUTPATIENT_CLINIC_OR_DEPARTMENT_OTHER): Payer: BC Managed Care – PPO | Admitting: Anesthesiology

## 2013-12-10 ENCOUNTER — Ambulatory Visit (HOSPITAL_BASED_OUTPATIENT_CLINIC_OR_DEPARTMENT_OTHER)
Admission: RE | Admit: 2013-12-10 | Discharge: 2013-12-10 | Disposition: A | Discharge status: Home / Self Care | Payer: BC Managed Care – PPO | Source: Ambulatory Visit | Attending: General Surgery | Admitting: General Surgery

## 2013-12-10 ENCOUNTER — Ambulatory Visit (HOSPITAL_BASED_OUTPATIENT_CLINIC_OR_DEPARTMENT_OTHER): Payer: BC Managed Care – PPO | Admitting: Anesthesiology

## 2013-12-10 DIAGNOSIS — Z803 Family history of malignant neoplasm of breast: Secondary | ICD-10-CM | POA: Insufficient documentation

## 2013-12-10 DIAGNOSIS — E041 Nontoxic single thyroid nodule: Secondary | ICD-10-CM | POA: Insufficient documentation

## 2013-12-10 DIAGNOSIS — Z800 Family history of malignant neoplasm of digestive organs: Secondary | ICD-10-CM | POA: Insufficient documentation

## 2013-12-10 DIAGNOSIS — F17200 Nicotine dependence, unspecified, uncomplicated: Secondary | ICD-10-CM | POA: Insufficient documentation

## 2013-12-10 DIAGNOSIS — Z01812 Encounter for preprocedural laboratory examination: Secondary | ICD-10-CM | POA: Insufficient documentation

## 2013-12-10 DIAGNOSIS — D4860 Neoplasm of uncertain behavior of unspecified breast: Secondary | ICD-10-CM

## 2013-12-10 DIAGNOSIS — E785 Hyperlipidemia, unspecified: Secondary | ICD-10-CM | POA: Insufficient documentation

## 2013-12-10 DIAGNOSIS — F3289 Other specified depressive episodes: Secondary | ICD-10-CM | POA: Insufficient documentation

## 2013-12-10 DIAGNOSIS — F329 Major depressive disorder, single episode, unspecified: Secondary | ICD-10-CM | POA: Insufficient documentation

## 2013-12-10 DIAGNOSIS — R928 Other abnormal and inconclusive findings on diagnostic imaging of breast: Secondary | ICD-10-CM

## 2013-12-10 DIAGNOSIS — D0590 Unspecified type of carcinoma in situ of unspecified breast: Secondary | ICD-10-CM | POA: Insufficient documentation

## 2013-12-10 DIAGNOSIS — J4489 Other specified chronic obstructive pulmonary disease: Secondary | ICD-10-CM | POA: Insufficient documentation

## 2013-12-10 DIAGNOSIS — K219 Gastro-esophageal reflux disease without esophagitis: Secondary | ICD-10-CM | POA: Insufficient documentation

## 2013-12-10 DIAGNOSIS — I10 Essential (primary) hypertension: Secondary | ICD-10-CM | POA: Insufficient documentation

## 2013-12-10 DIAGNOSIS — J449 Chronic obstructive pulmonary disease, unspecified: Secondary | ICD-10-CM | POA: Insufficient documentation

## 2013-12-10 DIAGNOSIS — Z01810 Encounter for preprocedural cardiovascular examination: Secondary | ICD-10-CM | POA: Insufficient documentation

## 2013-12-10 HISTORY — PX: BREAST LUMPECTOMY WITH RADIOACTIVE SEED LOCALIZATION: SHX6424

## 2013-12-10 LAB — POCT HEMOGLOBIN-HEMACUE: HEMOGLOBIN: 13.8 g/dL (ref 12.0–15.0)

## 2013-12-10 SURGERY — BREAST LUMPECTOMY WITH RADIOACTIVE SEED LOCALIZATION
Anesthesia: General | Site: Breast | Laterality: Left

## 2013-12-10 MED ORDER — LIDOCAINE HCL (CARDIAC) 20 MG/ML IV SOLN
INTRAVENOUS | Status: DC | PRN
Start: 2013-12-10 — End: 2013-12-10
  Administered 2013-12-10: 50 mg via INTRAVENOUS

## 2013-12-10 MED ORDER — PROPOFOL 10 MG/ML IV BOLUS
INTRAVENOUS | Status: DC | PRN
  Administered 2013-12-10: 200 mg via INTRAVENOUS

## 2013-12-10 MED ORDER — OXYCODONE HCL 5 MG/5ML PO SOLN: 5.0000 mg | Freq: Once | ORAL | Status: DC | PRN

## 2013-12-10 MED ORDER — SODIUM CHLORIDE 0.9 % IV SOLN: INTRAVENOUS | Status: DC

## 2013-12-10 MED ORDER — ACETAMINOPHEN 325 MG PO TABS: 650.0000 mg | ORAL_TABLET | ORAL | Status: DC | PRN

## 2013-12-10 MED ORDER — FENTANYL CITRATE 0.05 MG/ML IJ SOLN: 50.0000 ug | INTRAMUSCULAR | Status: DC | PRN

## 2013-12-10 MED ORDER — GLYCOPYRROLATE 0.2 MG/ML IJ SOLN
INTRAMUSCULAR | Status: DC | PRN
  Administered 2013-12-10: 0.2 mg via INTRAVENOUS

## 2013-12-10 MED ORDER — HYDROCODONE-ACETAMINOPHEN 5-325 MG PO TABS: 1.0000 | ORAL_TABLET | ORAL | Status: DC | PRN

## 2013-12-10 MED ORDER — SODIUM CHLORIDE 0.9 % IJ SOLN: 3.0000 mL | INTRAMUSCULAR | Status: DC | PRN

## 2013-12-10 MED ORDER — MIDAZOLAM HCL 2 MG/2ML IJ SOLN: 1.0000 mg | INTRAMUSCULAR | Status: DC | PRN

## 2013-12-10 MED ORDER — SODIUM CHLORIDE 0.9 % IV SOLN: 250.0000 mL | INTRAVENOUS | Status: DC | PRN

## 2013-12-10 MED ORDER — FENTANYL CITRATE 0.05 MG/ML IJ SOLN: 50.0000 ug | Freq: Once | INTRAMUSCULAR | Status: DC

## 2013-12-10 MED ORDER — BUPIVACAINE-EPINEPHRINE 0.5-1:200000 % IJ SOLN
INTRAMUSCULAR | Status: DC | PRN
  Administered 2013-12-10: 11 mL via INTRAPLEURAL

## 2013-12-10 MED ORDER — OXYCODONE HCL 5 MG PO TABS: 5.0000 mg | ORAL_TABLET | Freq: Once | ORAL | Status: DC | PRN

## 2013-12-10 MED ORDER — CEFAZOLIN SODIUM-DEXTROSE 2-3 GM-% IV SOLR
2.0000 g | INTRAVENOUS | Status: AC
  Administered 2013-12-10: 2 g via INTRAVENOUS

## 2013-12-10 MED ORDER — SODIUM CHLORIDE 0.9 % IJ SOLN: 3.0000 mL | Freq: Two times a day (BID) | INTRAMUSCULAR | Status: DC

## 2013-12-10 MED ORDER — CHLORHEXIDINE GLUCONATE 4 % EX LIQD: 1.0000 "application " | Freq: Once | CUTANEOUS | Status: DC

## 2013-12-10 MED ORDER — DEXAMETHASONE SODIUM PHOSPHATE 4 MG/ML IJ SOLN
INTRAMUSCULAR | Status: DC | PRN
  Administered 2013-12-10: 10 mg via INTRAVENOUS

## 2013-12-10 MED ORDER — MIDAZOLAM HCL 5 MG/5ML IJ SOLN
INTRAMUSCULAR | Status: DC | PRN
  Administered 2013-12-10: 2 mg via INTRAVENOUS

## 2013-12-10 MED ORDER — ONDANSETRON HCL 4 MG/2ML IJ SOLN
INTRAMUSCULAR | Status: DC | PRN
  Administered 2013-12-10: 4 mg via INTRAVENOUS

## 2013-12-10 MED ORDER — MIDAZOLAM HCL 2 MG/2ML IJ SOLN
INTRAMUSCULAR | Status: AC
  Filled 2013-12-10: qty 2

## 2013-12-10 MED ORDER — OXYCODONE HCL 5 MG PO TABS: 5.0000 mg | ORAL_TABLET | ORAL | Status: DC | PRN

## 2013-12-10 MED ORDER — FENTANYL CITRATE 0.05 MG/ML IJ SOLN
INTRAMUSCULAR | Status: AC
  Filled 2013-12-10: qty 6

## 2013-12-10 MED ORDER — LACTATED RINGERS IV SOLN
INTRAVENOUS | Status: DC
  Administered 2013-12-10 (×2): via INTRAVENOUS

## 2013-12-10 MED ORDER — ACETAMINOPHEN 650 MG RE SUPP: 650.0000 mg | RECTAL | Status: DC | PRN

## 2013-12-10 MED ORDER — FENTANYL CITRATE 0.05 MG/ML IJ SOLN
INTRAMUSCULAR | Status: DC | PRN
  Administered 2013-12-10 (×2): 50 ug via INTRAVENOUS

## 2013-12-10 MED ORDER — BUPIVACAINE-EPINEPHRINE PF 0.5-1:200000 % IJ SOLN
INTRAMUSCULAR | Status: AC
  Filled 2013-12-10: qty 30

## 2013-12-10 MED ORDER — FENTANYL CITRATE 0.05 MG/ML IJ SOLN: 25.0000 ug | INTRAMUSCULAR | Status: DC | PRN

## 2013-12-10 MED ORDER — CEFAZOLIN SODIUM-DEXTROSE 2-3 GM-% IV SOLR
INTRAVENOUS | Status: AC
  Filled 2013-12-10: qty 50

## 2013-12-10 MED ORDER — PROMETHAZINE HCL 25 MG/ML IJ SOLN: 6.2500 mg | INTRAMUSCULAR | Status: DC | PRN

## 2013-12-10 MED ORDER — HYDROMORPHONE HCL PF 1 MG/ML IJ SOLN: 0.2500 mg | INTRAMUSCULAR | Status: DC | PRN

## 2013-12-10 SURGICAL SUPPLY — 65 items
ADH SKN CLS APL DERMABOND .7 (GAUZE/BANDAGES/DRESSINGS) ×1
APL SKNCLS STERI-STRIP NONHPOA (GAUZE/BANDAGES/DRESSINGS)
APPLIER CLIP 9.375 MED OPEN (MISCELLANEOUS)
APR CLP MED 9.3 20 MLT OPN (MISCELLANEOUS)
BENZOIN TINCTURE PRP APPL 2/3 (GAUZE/BANDAGES/DRESSINGS) IMPLANT
BINDER BREAST LRG (GAUZE/BANDAGES/DRESSINGS) IMPLANT
BINDER BREAST MEDIUM (GAUZE/BANDAGES/DRESSINGS) ×2 IMPLANT
BINDER BREAST XLRG (GAUZE/BANDAGES/DRESSINGS) IMPLANT
BINDER BREAST XXLRG (GAUZE/BANDAGES/DRESSINGS) IMPLANT
BLADE HEX COATED 2.75 (ELECTRODE) ×3 IMPLANT
BLADE SURG 10 STRL SS (BLADE) IMPLANT
BLADE SURG 15 STRL LF DISP TIS (BLADE) ×1 IMPLANT
BLADE SURG 15 STRL SS (BLADE) ×3
CANISTER SUC SOCK COL 7IN (MISCELLANEOUS) ×1 IMPLANT
CANISTER SUCT 1200ML W/VALVE (MISCELLANEOUS) ×3 IMPLANT
CHLORAPREP W/TINT 26ML (MISCELLANEOUS) ×3 IMPLANT
CLIP APPLIE 9.375 MED OPEN (MISCELLANEOUS) ×1 IMPLANT
CLOSURE WOUND 1/2 X4 (GAUZE/BANDAGES/DRESSINGS)
COVER MAYO STAND STRL (DRAPES) ×3 IMPLANT
COVER PROBE W GEL 5X96 (DRAPES) ×3 IMPLANT
COVER TABLE BACK 60X90 (DRAPES) ×3 IMPLANT
DECANTER SPIKE VIAL GLASS SM (MISCELLANEOUS) IMPLANT
DERMABOND ADVANCED (GAUZE/BANDAGES/DRESSINGS) ×2
DERMABOND ADVANCED .7 DNX12 (GAUZE/BANDAGES/DRESSINGS) ×1 IMPLANT
DEVICE DUBIN W/COMP PLATE 8390 (MISCELLANEOUS) ×3 IMPLANT
DRAIN CHANNEL 19F RND (DRAIN) IMPLANT
DRAPE LAPAROSCOPIC ABDOMINAL (DRAPES) ×3 IMPLANT
DRAPE UTILITY XL STRL (DRAPES) ×3 IMPLANT
DRSG PAD ABDOMINAL 8X10 ST (GAUZE/BANDAGES/DRESSINGS) ×2 IMPLANT
ELECT REM PT RETURN 9FT ADLT (ELECTROSURGICAL) ×3
ELECTRODE REM PT RTRN 9FT ADLT (ELECTROSURGICAL) ×1 IMPLANT
EVACUATOR SILICONE 100CC (DRAIN) IMPLANT
GLOVE EUDERMIC 7 POWDERFREE (GLOVE) ×3 IMPLANT
GLOVE SURG SS PI 7.0 STRL IVOR (GLOVE) ×2 IMPLANT
GOWN STRL REUS W/ TWL LRG LVL3 (GOWN DISPOSABLE) ×1 IMPLANT
GOWN STRL REUS W/ TWL XL LVL3 (GOWN DISPOSABLE) ×1 IMPLANT
GOWN STRL REUS W/TWL LRG LVL3 (GOWN DISPOSABLE) ×3
GOWN STRL REUS W/TWL XL LVL3 (GOWN DISPOSABLE) ×3
KIT MARKER MARGIN INK (KITS) ×5 IMPLANT
NDL HYPO 25X1 1.5 SAFETY (NEEDLE) IMPLANT
NEEDLE HYPO 25X1 1.5 SAFETY (NEEDLE) ×3 IMPLANT
NS IRRIG 1000ML POUR BTL (IV SOLUTION) ×3 IMPLANT
PACK BASIN DAY SURGERY FS (CUSTOM PROCEDURE TRAY) ×3 IMPLANT
PENCIL BUTTON HOLSTER BLD 10FT (ELECTRODE) ×3 IMPLANT
PIN SAFETY STERILE (MISCELLANEOUS) ×1 IMPLANT
SHEET MEDIUM DRAPE 40X70 STRL (DRAPES) IMPLANT
SLEEVE SCD COMPRESS KNEE MED (MISCELLANEOUS) ×3 IMPLANT
SPONGE GAUZE 4X4 12PLY STER LF (GAUZE/BANDAGES/DRESSINGS) ×2 IMPLANT
SPONGE LAP 18X18 X RAY DECT (DISPOSABLE) IMPLANT
SPONGE LAP 4X18 X RAY DECT (DISPOSABLE) ×3 IMPLANT
STRIP CLOSURE SKIN 1/2X4 (GAUZE/BANDAGES/DRESSINGS) IMPLANT
SUT ETHILON 3 0 FSL (SUTURE) IMPLANT
SUT MNCRL AB 4-0 PS2 18 (SUTURE) ×3 IMPLANT
SUT SILK 2 0 SH (SUTURE) ×3 IMPLANT
SUT VIC AB 2-0 CT1 27 (SUTURE)
SUT VIC AB 2-0 CT1 TAPERPNT 27 (SUTURE) IMPLANT
SUT VIC AB 3-0 SH 27 (SUTURE)
SUT VIC AB 3-0 SH 27X BRD (SUTURE) IMPLANT
SUT VICRYL 3-0 CR8 SH (SUTURE) ×3 IMPLANT
SYR CONTROL 10ML LL (SYRINGE) ×2 IMPLANT
TOWEL OR 17X24 6PK STRL BLUE (TOWEL DISPOSABLE) ×3 IMPLANT
TOWEL OR NON WOVEN STRL DISP B (DISPOSABLE) ×1 IMPLANT
TUBE CONNECTING 20'X1/4 (TUBING) ×1
TUBE CONNECTING 20X1/4 (TUBING) ×2 IMPLANT
YANKAUER SUCT BULB TIP NO VENT (SUCTIONS) ×3 IMPLANT

## 2013-12-10 NOTE — Op Note (Signed)
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 Dalbert Batman, M.D., FACS  Assistant:                      None  Operative Indications:   Robin Barton is a 58 y.o. female. She is referred by Dr. Ileana Roup mammography for evaluation and surgical management of abnormal mammogram of the left breast with a biopsy that shows atypical lobular neoplasia. Her PCP is Dr. Velna Hatchet at [facility].  The patient has no prior history of breast problems. She simply went for screening mammography and also had a diagnostic left breast mammogram and left ultrasound. This revealed a 5 mm density left breast at the 10:00 position anteriorly and they saw a second density, 9 mm in diameter left breast 1:00 position, posteriorly 5 cm from the nipple. The axilla was negative.  They biopsied both areas. The 10:00 biopsy was benign breast tissue, she states they told her it was a cyst which resolved. The 1:00 position biopsy, however, shows lobular neoplasm, or atypical lobular neoplasia with calcifications. She was referred for surgical management. Examination reveals only a small area of ecchymoses laterally. History reveals breast cancer in a sister, early diagnosis. Colon cancer in the mother. No history of ovarian cancer in the family.   Operative Findings:       The radioactive seed and the marker to work immediately adjacent to each other, and both were present within the center of the lumpectomy specimen. The radioactivity was confined to the lumpectomy specimen. At the completion of the case there was no radioactivity remaining in the breast. There was a single seed.  Procedure in Detail:       The patient  underwent insertion of radioactive seed in her left breast laterally at [facility]  recently.   In the holding area, preoperatively, we used the neoprobe confirming radioactivity in the left breast. Patient to the operating room and underwent general LMA anesthesia. The left breast was prepped and draped in a sterile fashion. 0.5% Marcaine with epinephrine was used as local infiltration anesthetic. Using the neoprobe I localized the area of highest radioactivity which was in the left lateral breast at about the 2:30 position. A curvilinear incision was made at this site and dissection was carried down into the breast tissue and around the radioactive seed. I used the neoprobe to guide my dissection in numerous steps. The specimen was removed and marked with silk sutures and the 6 color ink kit  to orient the pathologist. There was intense radioactivity within the specimen. Specimen mammogram showed the clip and the seed within the specimen. Specimen was then marked and sent to pathology. Hemostasis was excellent and achieved with electrocautery. The breast tissues were closed with interrupted sutures of 3-0 Vicryl and the skin closed with a running subcuticular suture of 4-0 Monocryl and Dermabond. The breast binder was placed. Patient was taken to recovery room in  stable condition. EBL 10 cc. Counts correct. Complications none.     Edsel Petrin. Dalbert Batman, M.D., FACS General and Minimally Invasive Surgery Breast and Colorectal Surgery  12/10/2013 2:02 PM

## 2013-12-10 NOTE — Transfer of Care (Signed)
Immediate Anesthesia Transfer of Care Note  Patient: Robin Barton  Procedure(s) Performed: Procedure(s): BREAST LUMPECTOMY WITH RADIOACTIVE SEED LOCALIZATION (Left)  Patient Location: PACU  Anesthesia Type:General  Level of Consciousness: awake and alert   Airway & Oxygen Therapy: Patient Spontanous Breathing and Patient connected to face mask oxygen  Post-op Assessment: Report given to PACU RN and Post -op Vital signs reviewed and stable  Post vital signs: Reviewed and stable  Complications: No apparent anesthesia complications

## 2013-12-10 NOTE — Anesthesia Preprocedure Evaluation (Signed)
Anesthesia Evaluation  Patient identified by MRN, date of birth, ID band Patient awake    Reviewed: Allergy & Precautions, H&P , NPO status , Patient's Chart, lab work & pertinent test results  Airway Mallampati: I TM Distance: >3 FB Neck ROM: Full    Dental   Pulmonary COPDCurrent Smoker,  + rhonchi         Cardiovascular hypertension, Rhythm:Regular Rate:Normal     Neuro/Psych Depression    GI/Hepatic   Endo/Other    Renal/GU      Musculoskeletal   Abdominal   Peds  Hematology   Anesthesia Other Findings   Reproductive/Obstetrics                           Anesthesia Physical Anesthesia Plan  ASA: II  Anesthesia Plan: General   Post-op Pain Management:    Induction: Intravenous  Airway Management Planned: LMA  Additional Equipment:   Intra-op Plan:   Post-operative Plan: Extubation in OR  Informed Consent:   Plan Discussed with: CRNA and Surgeon  Anesthesia Plan Comments:         Anesthesia Quick Evaluation

## 2013-12-10 NOTE — Interval H&P Note (Signed)
History and Physical Interval Note:  12/10/2013 11:32 AM  Robin Barton  has presented today for surgery, with the diagnosis of Abnormal mammogram left breast  The various methods of treatment have been discussed with the patient and family. After consideration of risks, benefits and other options for treatment, the patient has consented to  Procedure(s): BREAST LUMPECTOMY WITH RADIOACTIVE SEED LOCALIZATION (Left) as a surgical intervention .  The patient's history has been reviewed, patient examined, no change in status, stable for surgery.  I have reviewed the patient's chart and labs.  Questions were answered to the patient's satisfaction.     [name]

## 2013-12-10 NOTE — Anesthesia Procedure Notes (Signed)
Procedure Name: LMA Insertion Date/Time: 12/10/2013 1:17 PM Performed by: Melynda Ripple D Pre-anesthesia Checklist: Patient identified, Emergency Drugs available, Suction available and Patient being monitored Patient Re-evaluated:Patient Re-evaluated prior to inductionOxygen Delivery Method: Circle System Utilized Preoxygenation: Pre-oxygenation with 100% oxygen Intubation Type: IV induction Ventilation: Mask ventilation without difficulty LMA: LMA inserted LMA Size: 4.0 Number of attempts: 1 Airway Equipment and Method: bite block Placement Confirmation: positive ETCO2 Tube secured with: Tape Dental Injury: Teeth and Oropharynx as per pre-operative assessment

## 2013-12-10 NOTE — Discharge Instructions (Signed)
[facility] °Office Phone Number [phone] ° °BREAST BIOPSY/ PARTIAL MASTECTOMY: POST OP INSTRUCTIONS ° °Always review your discharge instruction sheet given to you by the facility where your surgery was performed. ° °IF YOU HAVE DISABILITY OR FAMILY LEAVE FORMS, YOU MUST BRING THEM TO THE OFFICE FOR PROCESSING.  DO NOT GIVE THEM TO YOUR DOCTOR. ° °1. A prescription for pain medication may be given to you upon discharge.  Take your pain medication as prescribed, if needed.  If narcotic pain medicine is not needed, then you may take acetaminophen (Tylenol) or ibuprofen (Advil) as needed. °2. Take your usually prescribed medications unless otherwise directed °3. If you need a refill on your pain medication, please contact your pharmacy.  They will contact our office to request authorization.  Prescriptions will not be filled after 5pm or on week-ends. °4. You should eat very light the first 24 hours after surgery, such as soup, crackers, pudding, etc.  Resume your normal diet the day after surgery. °5. Most patients will experience some swelling and bruising in the breast.  Ice packs and a good support bra will help.  Swelling and bruising can take several days to resolve.  °6. It is common to experience some constipation if taking pain medication after surgery.  Increasing fluid intake and taking a stool softener will usually help or prevent this problem from occurring.  A mild laxative (Milk of Magnesia or Miralax) should be taken according to package directions if there are no bowel movements after 48 hours. °7. Unless discharge instructions indicate otherwise, you may remove your bandages 24-48 hours after surgery, and you may shower at that time.  You may have steri-strips (small skin tapes) in place directly over the incision.  These strips should be left on the skin for 7-10 days.  If your surgeon used skin glue on the incision, you may shower in 24 hours.  The glue will flake off over the  next 2-3 weeks.  Any sutures or staples will be removed at the office during your follow-up visit. °8. ACTIVITIES:  You may resume regular daily activities (gradually increasing) beginning the next day.  Wearing a good support bra or sports bra minimizes pain and swelling.  You may have sexual intercourse when it is comfortable. °a. You may drive when you no longer are taking prescription pain medication, you can comfortably wear a seatbelt, and you can safely maneuver your car and apply brakes. °b. RETURN TO WORK:  ______________________________________________________________________________________ °9. You should see your doctor in the office for a follow-up appointment approximately two weeks after your surgery.  Your doctor’s nurse will typically make your follow-up appointment when she calls you with your pathology report.  Expect your pathology report 2-3 business days after your surgery.  You may call to check if you do not hear from us after three days. °10. OTHER INSTRUCTIONS: _______________________________________________________________________________________________ _____________________________________________________________________________________________________________________________________ °_____________________________________________________________________________________________________________________________________ °_____________________________________________________________________________________________________________________________________ ° °WHEN TO CALL YOUR DOCTOR: °1. Fever over 101.0 °2. Nausea and/or vomiting. °3. Extreme swelling or bruising. °4. Continued bleeding from incision. °5. Increased pain, redness, or drainage from the incision. ° °The clinic staff is available to answer your questions during regular business hours.  Please don’t hesitate to call and ask to speak to one of the nurses for clinical concerns.  If you have a medical emergency, go to the nearest  emergency room or call 911.  A surgeon from [facility] is always on call at the hospital. ° °For further questions, please visit [email or web address]  ° ° °  Post Anesthesia Home Care Instructions ° °Activity: °Get plenty of rest for the remainder of the day. A responsible adult should stay with you for 24 hours following the procedure.  °For the next 24 hours, DO NOT: °-Drive a car °-Operate machinery °-Drink alcoholic beverages °-Take any medication unless instructed by your physician °-Make any legal decisions or sign important papers. ° °Meals: °Start with liquid foods such as gelatin or soup. Progress to regular foods as tolerated. Avoid greasy, spicy, heavy foods. If nausea and/or vomiting occur, drink only clear liquids until the nausea and/or vomiting subsides. Call your physician if vomiting continues. ° °Special Instructions/Symptoms: °Your throat may feel dry or sore from the anesthesia or the breathing tube placed in your throat during surgery. If this causes discomfort, gargle with warm salt water. The discomfort should disappear within 24 hours. ° °

## 2013-12-13 ENCOUNTER — Telehealth (INDEPENDENT_AMBULATORY_CARE_PROVIDER_SITE_OTHER): Payer: Self-pay | Admitting: General Surgery

## 2013-12-13 ENCOUNTER — Encounter (HOSPITAL_BASED_OUTPATIENT_CLINIC_OR_DEPARTMENT_OTHER): Payer: Self-pay | Admitting: General Surgery

## 2013-12-13 NOTE — Telephone Encounter (Signed)
I called to discuss her pathology report with her. There was no answer but I left a message on the answering machine to give me a call. We will try to contact her again tomorrow.  Edsel Petrin. Dalbert Batman, M.D., [facility] General and Minimally invasive Surgery Breast and Colorectal Surgery Office:   [phone] Pager:   [phone]

## 2013-12-14 ENCOUNTER — Telehealth (INDEPENDENT_AMBULATORY_CARE_PROVIDER_SITE_OTHER): Payer: Self-pay | Admitting: General Surgery

## 2013-12-14 NOTE — Telephone Encounter (Signed)
Dr. Dalbert Batman called from the OR to give me instructions to call the patient to make her aware no cancer was found and no further surgery was needed.  He said he is unsure when he will be able to call but will try to later this afternoon or tomorrow.  I called the patient to make her aware and she states understanding at this time.

## 2013-12-14 NOTE — Telephone Encounter (Signed)
Robin Barton called back at this time.  I informed her that Dr. Dalbert Batman will try to call her again today between cases.  Robin Barton states understanding and agreeable at this time.

## 2013-12-14 NOTE — Telephone Encounter (Signed)
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 I called the patient and discussed this with her. I told her that she did not have cancer that would require any further treatment, but that LCIS is a marker lesion and a risk factor for future cancer. I told her that we should consider referring her to the high risk breast clinic, and she seems somewhat interested in that. I will see her on April 6 we will discuss this further.   Edsel Petrin. Dalbert Batman, M.D., [facility] General and Minimally invasive Surgery Breast and Colorectal Surgery Office:   [phone] Pager:   [phone]

## 2013-12-15 ENCOUNTER — Encounter (INDEPENDENT_AMBULATORY_CARE_PROVIDER_SITE_OTHER): Payer: Self-pay

## 2013-12-17 ENCOUNTER — Encounter (INDEPENDENT_AMBULATORY_CARE_PROVIDER_SITE_OTHER): Payer: Self-pay

## 2013-12-20 NOTE — Anesthesia Postprocedure Evaluation (Signed)
  Anesthesia Post-op Note  Patient: Robin Barton  Procedure(s) Performed: Procedure(s): BREAST LUMPECTOMY WITH RADIOACTIVE SEED LOCALIZATION (Left)  Patient Location: PACU  Anesthesia Type:General  Level of Consciousness: awake and alert   Airway and Oxygen Therapy: Patient Spontanous Breathing  Post-op Pain: mild  Post-op Assessment: Post-op Vital signs reviewed, Patient's Cardiovascular Status Stable, Respiratory Function Stable, Patent Airway, No signs of Nausea or vomiting and Pain level controlled  Post-op Vital Signs: Reviewed and stable  Complications: No apparent anesthesia complications

## 2013-12-27 ENCOUNTER — Encounter (INDEPENDENT_AMBULATORY_CARE_PROVIDER_SITE_OTHER): Payer: Self-pay | Admitting: General Surgery

## 2013-12-27 ENCOUNTER — Ambulatory Visit (INDEPENDENT_AMBULATORY_CARE_PROVIDER_SITE_OTHER): Payer: BC Managed Care – PPO | Admitting: General Surgery

## 2013-12-27 ENCOUNTER — Other Ambulatory Visit (INDEPENDENT_AMBULATORY_CARE_PROVIDER_SITE_OTHER): Payer: Self-pay | Admitting: General Surgery

## 2013-12-27 VITALS — BP 111/66 | HR 88 | Temp 99.1°F | Resp 16 | Ht 62.0 in | Wt 116.8 lb

## 2013-12-27 DIAGNOSIS — C50912 Malignant neoplasm of unspecified site of left female breast: Secondary | ICD-10-CM

## 2013-12-27 DIAGNOSIS — R928 Other abnormal and inconclusive findings on diagnostic imaging of breast: Secondary | ICD-10-CM

## 2013-12-27 NOTE — Progress Notes (Signed)
Patient ID: Robin Barton, female   DOB: 06-22-1956, 58 y.o.   MRN: [id number] History: This woman underwent left partial mastectomy with radioactive seed localization on 12/10/2013. She is healing and recovering uneventfully with minimal pain. Pathology shows lobular neoplasia, lobular carcinoma in situ, and fibroadenoma. Recall that her sister was diagnosed with breast cancer, apparently early diagnosis, and she opted for bilateral mastectomy, and is otherwise doing well.  Exam:  Patient looks well. No distress. Left breast is examined. Incision laterally is healing normally. No hematoma. No significant volume defect. A little lumpy. No infection.  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  I spent a long time discussing the implications of her histology with her, she seems to understand. I told her that it would be a good idea to be assessed in the breast high-risk breast clinic because her family history and her histology, and she agrees. She will be referred to Dr. Marcy Panning in the [facility] high-risk breast clinic Return to see me as needed.  Edsel Petrin. Dalbert Batman, M.D., [facility] General and Minimally invasive Surgery Breast and Colorectal Surgery Office:   [phone] Pager:   [phone]

## 2013-12-27 NOTE — Patient Instructions (Signed)
Your left lumpectomy surgery site is healing without any obvious surgical complications. You may resume light exercise nail, and you may resume full activities without restriction in 7-10 days.  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 This is not a breast cancer that requires treatment, but is a indicator, or marker lesion for increased risk.  Considering this pathology report and the fact that your sister had breast cancer, we have recommended that you be referred to the high risk breast clinic at the [place] [facility].  We will schedule that appointment for you.  Return to see Dr. Dalbert Batman as needed.

## 2013-12-29 ENCOUNTER — Telehealth: Payer: Self-pay | Admitting: Oncology

## 2013-12-29 NOTE — Telephone Encounter (Signed)
LEFT MESSAGE FOR PATIENT TO RETURN CALL TO SCHEDULE HIGH RISK APPT.  °

## 2013-12-29 NOTE — Telephone Encounter (Signed)
C/D 12/29/13 for appt. 04/26/14

## 2014-01-05 ENCOUNTER — Telehealth: Payer: Self-pay | Admitting: Oncology

## 2014-01-05 NOTE — Telephone Encounter (Signed)
PATIENT CALLED TO R/S NEW PATIENT APPT 04/23 @ 1:30 W/DR. KHAN.

## 2014-01-14 ENCOUNTER — Encounter (INDEPENDENT_AMBULATORY_CARE_PROVIDER_SITE_OTHER): Payer: Self-pay

## 2014-03-10 ENCOUNTER — Telehealth: Payer: Self-pay | Admitting: *Deleted

## 2014-03-10 NOTE — Telephone Encounter (Signed)
Called pt and she was already aware of Dr. Humphrey Rolls.  Cancelled that appt.  Confirmed 03/15/14 High Risk appt w/ pt.

## 2014-03-14 ENCOUNTER — Other Ambulatory Visit: Payer: Self-pay

## 2014-03-14 ENCOUNTER — Telehealth: Payer: Self-pay | Admitting: *Deleted

## 2014-03-14 DIAGNOSIS — R928 Other abnormal and inconclusive findings on diagnostic imaging of breast: Secondary | ICD-10-CM

## 2014-03-14 NOTE — Telephone Encounter (Signed)
Called pt and informed her about Dr. Grayland Ormond and confirmed 04/05/14 appt w/ her.

## 2014-03-15 ENCOUNTER — Ambulatory Visit: Payer: BC Managed Care – PPO | Admitting: Oncology

## 2014-03-15 ENCOUNTER — Other Ambulatory Visit: Payer: BC Managed Care – PPO

## 2014-03-30 ENCOUNTER — Telehealth: Payer: Self-pay | Admitting: *Deleted

## 2014-03-30 NOTE — Telephone Encounter (Signed)
Received message from Webb Silversmith that pt needed to speak to me about her HR appt.  Called pt and she has another appt on 7/14 and needs to reschedule her appt w/ [place].  Confirmed 04/06/14 appt w/ pt.

## 2014-04-05 ENCOUNTER — Other Ambulatory Visit: Payer: BC Managed Care – PPO

## 2014-04-05 ENCOUNTER — Other Ambulatory Visit: Payer: Self-pay | Admitting: *Deleted

## 2014-04-05 DIAGNOSIS — E785 Hyperlipidemia, unspecified: Secondary | ICD-10-CM

## 2014-04-05 DIAGNOSIS — Z800 Family history of malignant neoplasm of digestive organs: Secondary | ICD-10-CM

## 2014-04-05 DIAGNOSIS — R928 Other abnormal and inconclusive findings on diagnostic imaging of breast: Secondary | ICD-10-CM

## 2014-04-06 ENCOUNTER — Encounter: Payer: Self-pay | Admitting: Hematology

## 2014-04-06 ENCOUNTER — Ambulatory Visit (HOSPITAL_BASED_OUTPATIENT_CLINIC_OR_DEPARTMENT_OTHER): Payer: BC Managed Care – PPO | Admitting: Hematology

## 2014-04-06 ENCOUNTER — Other Ambulatory Visit (HOSPITAL_BASED_OUTPATIENT_CLINIC_OR_DEPARTMENT_OTHER): Payer: BC Managed Care – PPO

## 2014-04-06 ENCOUNTER — Other Ambulatory Visit: Payer: Self-pay | Admitting: *Deleted

## 2014-04-06 VITALS — BP 149/82 | HR 45 | Temp 98.1°F | Resp 20 | Ht 62.0 in | Wt 118.8 lb

## 2014-04-06 DIAGNOSIS — E041 Nontoxic single thyroid nodule: Secondary | ICD-10-CM

## 2014-04-06 DIAGNOSIS — D0590 Unspecified type of carcinoma in situ of unspecified breast: Secondary | ICD-10-CM

## 2014-04-06 DIAGNOSIS — Z800 Family history of malignant neoplasm of digestive organs: Secondary | ICD-10-CM

## 2014-04-06 DIAGNOSIS — N959 Unspecified menopausal and perimenopausal disorder: Secondary | ICD-10-CM | POA: Insufficient documentation

## 2014-04-06 DIAGNOSIS — R928 Other abnormal and inconclusive findings on diagnostic imaging of breast: Secondary | ICD-10-CM

## 2014-04-06 DIAGNOSIS — D0502 Lobular carcinoma in situ of left breast: Secondary | ICD-10-CM

## 2014-04-06 DIAGNOSIS — Z803 Family history of malignant neoplasm of breast: Secondary | ICD-10-CM

## 2014-04-06 DIAGNOSIS — E785 Hyperlipidemia, unspecified: Secondary | ICD-10-CM

## 2014-04-06 DIAGNOSIS — Z79811 Long term (current) use of aromatase inhibitors: Secondary | ICD-10-CM

## 2014-04-06 LAB — CBC WITH DIFFERENTIAL/PLATELET
BASO%: 0.6 % (ref 0.0–2.0)
Basophils Absolute: 0.1 10*3/uL (ref 0.0–0.1)
EOS%: 0.2 % (ref 0.0–7.0)
Eosinophils Absolute: 0 10*3/uL (ref 0.0–0.5)
HCT: 40.8 % (ref 34.8–46.6)
HGB: 13.4 g/dL (ref 11.6–15.9)
LYMPH#: 1 10*3/uL (ref 0.9–3.3)
LYMPH%: 9.9 % — ABNORMAL LOW (ref 14.0–49.7)
MCH: 31.9 pg (ref 25.1–34.0)
MCHC: 32.8 g/dL (ref 31.5–36.0)
MCV: 97.4 fL (ref 79.5–101.0)
MONO#: 0.8 10*3/uL (ref 0.1–0.9)
MONO%: 8.3 % (ref 0.0–14.0)
NEUT%: 81 % — ABNORMAL HIGH (ref 38.4–76.8)
NEUTROS ABS: 7.8 10*3/uL — AB (ref 1.5–6.5)
Platelets: 362 10*3/uL (ref 145–400)
RBC: 4.19 10*6/uL (ref 3.70–5.45)
RDW: 13.6 % (ref 11.2–14.5)
WBC: 9.7 10*3/uL (ref 3.9–10.3)

## 2014-04-06 MED ORDER — EXEMESTANE 25 MG PO TABS: 25.0000 mg | ORAL_TABLET | Freq: Every day | ORAL | Status: DC

## 2014-04-06 NOTE — Progress Notes (Signed)
[place] [facility] CONSULT NOTE  Patient Care Team: Velna Hatchet, MD as PCP - General (Internal Medicine) Surgeon: Edsel Petrin. Dalbert Batman GYN: Edwinna Areola  CHIEF COMPLAINTS/PURPOSE OF CONSULTATION: Patient with LCIS left breast.   HISTORY OF PRESENTING ILLNESS:   Robin Barton 58 y.o. female is here because of recent diagnosis of leftbreast LCIS. This was detected by screening mammogram performed at [facility]. The cancer was not palpable prior to diagnosis. The patient underwent mammogram on 11/23/2013 and an oval nodular density was seen in left breast anterior depth central to the nipple seen on the craniocaudal view only and other significant masses, calcifications or other findings were seen. Additional evaluation was done on 11/24/2013 and ultrasound should 0.5 cm oval mass with circumscribed margin in the left breast at 10:00 o clock anterior depth 2 cm from the nipple. There was also a 0.9 cm oval mass with circumscribed margins in the left breast at 1:00 posterior depth 5 cm from the nipple. The axillary region was normal with no adenopathy. First they were felt to be complex cyst. Patient was seen by Dr. Fanny Skates. She did undergo a left breast needle core biopsy at 1:00 showing lobular neoplasia (atypical lobular hyperplasia ALH) the left breast biopsy at 10:00 was negative for atypia or malignancy.  Patient then underwent a left breast lumpectomy on 12/10/2013 by Dr. Dalbert Batman and pathology showed LCIS with calcifications. A fibroadenoma was noted. She is now being referred for breast high risk clinic as she has a family history of breast cancer. Sister had breast cancer at age 76 and underwent double mastectomy, no xrt or chemo. Mom developed stage 4 colon cancer in her 45's. No hx of ovarian cancer in family., No use of estrogen replacement, did use BCP, She did breast feeding, first live birth age 34. She has hx of skin basal cell cancer resected left arm.  I reviewed her  records extensively and collaborated the history with the patient.  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 She received birth control pills.  She was never exposed to fertility medications or hormone replacement therapy.  She has + family history of Breast/GYN/GI cancer  MEDICAL HISTORY:  Past Medical History  Diagnosis Date  . Hypertension   . Thyroid disease     rt thyroid nodule  . Hyperlipidemia   . Osteopenia   . BCC (basal cell carcinoma)   . Acid reflux   . Depression   . Wears glasses     SURGICAL HISTORY: Past Surgical History  Procedure Laterality Date  . Bunionectomy  2/13 left foot and toe straightened  . Bunionectomy  3/13 right foot and toe straightened  . Nasal septum surgery  1982  . Colonoscopy    . Breast lumpectomy with radioactive seed localization Left 12/10/2013    Procedure: BREAST LUMPECTOMY WITH RADIOACTIVE SEED LOCALIZATION;  Surgeon: [name], MD;  Location: [facility];  Service: General;  Laterality: Left;  . Breast surgery      SOCIAL HISTORY: History   Social History  . Marital Status: Married    Spouse Name: N/A    Number of Children: 2  . Years of Education: N/A   Occupational History  .  SALES    Social History Main Topics  . Smoking status: Current Some Day Smoker -- 0.50 packs/day    Types: Cigarettes    Last Attempt to Quit: 09/23/2002  . Smokeless tobacco: Never Used  . Alcohol Use: 2.5 - 3.0 oz/week    5-6 drink(s) per week     Comment: 2 wines most days  . Drug Use: No  . Sexual Activity: Yes    Partners: Male    Birth Control/ Protection: None   Other Topics Concern  . Not on file   Social History Narrative  . No narrative on file    FAMILY  HISTORY: Family History  Problem Relation Age of Onset  . Cancer Mother     colon-stage 4  . Diabetes Sister     borderline  . Breast cancer Sister 28    double mastectomy  . Depression Sister   . Hypertension Mother   . Hypertension Sister   . Angina Maternal Grandfather   . Osteoporosis Sister     ALLERGIES:  Sulfa drugs cause HIVES.  MEDICATIONS:  Current Outpatient Prescriptions  Medication Sig Dispense Refill  . atenolol (TENORMIN) 25 MG tablet Take 1 tablet (25 mg total) by mouth daily.  30 tablet  2  . calcium carbonate (OS-CAL - DOSED IN MG OF ELEMENTAL CALCIUM) 1250 MG tablet Take 1 tablet by mouth daily.        . Cholecalciferol (VITAMIN D) 1000 UNITS capsule Take 1,000 Units by mouth daily.        Marland Kitchen HYDROcodone-acetaminophen (NORCO/VICODIN) 5-325 MG per tablet Take 1-2 tablets by mouth every 4 (four) hours as needed.  30 tablet  0  . ibuprofen (ADVIL,MOTRIN) 800 MG tablet Take 1 tablet (800 mg total) by mouth every 8 (eight) hours as needed for pain.  30 tablet  5  . lisinopril (PRINIVIL,ZESTRIL) 40 MG tablet Take 1 tablet (40 mg total) by mouth daily.  30 tablet  2  . PARoxetine (PAXIL) 20 MG tablet Take 1 tablet (20 mg total) by mouth every morning.  30 tablet  13  . simvastatin (ZOCOR) 40 MG tablet Take 40 mg by mouth every evening.      . traZODone (DESYREL) 50 MG tablet Take 1 tablet (50 mg total) by mouth at bedtime.  30 tablet  PRN  . exemestane (AROMASIN) 25 MG tablet Take 1 tablet (25 mg total) by mouth daily after breakfast.  90 tablet  3   No current facility-administered medications for this visit.    REVIEW OF SYSTEMS:    Constitutional: Denies fevers, chills or abnormal night sweats Eyes: Denies blurriness of vision, double vision or watery eyes Ears, nose, mouth, throat, and face: Denies mucositis or sore throat Respiratory: Denies cough, dyspnea or wheezes Cardiovascular: Denies palpitation, chest discomfort or lower extremity  swelling Gastrointestinal:  Denies nausea, heartburn or change in bowel habits Skin: Denies abnormal skin rashes Lymphatics: Denies new lymphadenopathy or easy bruising Neurological:Denies numbness, tingling or new weaknesses Behavioral/Psych: Mood is stable, no new changes  All other systems were reviewed with the patient and are negative.  PHYSICAL EXAMINATION: ECOG PERFORMANCE STATUS: 0, KPS 100  Filed Vitals:   04/06/14 0957  BP: 149/82  Pulse: 45  Temp: 98.1 F (36.7 C)  Resp: 20   Filed Weights   04/06/14 0957  Weight: 118 lb 12.8 oz (53.887 kg)    GENERAL:alert, no distress and comfortable SKIN: skin color, texture, turgor are normal, no rashes or significant lesions EYES: normal,  conjunctiva are pink and non-injected, sclera clear OROPHARYNX:no exudate, no erythema and lips, buccal mucosa, and tongue normal  NECK: supple, thyroid normal size, non-tender, without nodularity LYMPH:  no palpable lymphadenopathy in the cervical, axillary or inguinal LUNGS: clear to auscultation and percussion with normal breathing effort HEART: regular rate & rhythm and no murmurs and no lower extremity edema ABDOMEN:abdomen soft, non-tender and normal bowel sounds Musculoskeletal:no cyanosis of digits and no clubbing  PSYCH: alert & oriented x 3 with fluent speech NEURO: no focal motor/sensory deficits Breast exam: There is no palpable or dominant mass. There no skin changes. There is no adenopathy. The surgical scar has healed.  LABORATORY DATA:  I have reviewed the data as listed Lab Results  Component Value Date   WBC 9.7 04/06/2014   HGB 13.4 04/06/2014   HCT 40.8 04/06/2014   MCV 97.4 04/06/2014   PLT 362 04/06/2014   Lab Results  Component Value Date   NA 140 12/08/2013   K 4.7 12/08/2013   CL 102 12/08/2013   CO2 27 12/08/2013    RADIOGRAPHIC STUDIES: I have personally reviewed the radiological images including mammogram, ultrasound results and pathology  data.    ASSESSMENT:   1. Patient is a 58 years old postmenopausal female with new diagnosis of left breast LCIS. First she had a biopsy done on 11/25/2013 followed by lumpectomy on 12/10/2013 by Dr. Dalbert Batman. She is here to discuss further treatment options.  2. LCIS is a noninvasive lesion that arises from the lobules and terminal duct of the breast. It is a risk factor for invasive carcinoma and may be a direct precursor lesion. Retrospective series report a high risk of developing ipsilateral and contralateral invasive breast cancer and a greater likelihood of developing invasive lobular carcinoma rather than invasive ductal carcinoma.  3. the relative risk of developing an invasive cancer in woman with LCIS is approximately twofold higher than for woman without LCIS. The absolute risk is approximately 1% per year and appears to be lifelong. In a NSABP study of 180 woman with LCIS, 5% developed an ipsilateral invasive carcinoma after 12 years of followup while similar fraction 5.6% developed a contralateral invasive tumor.  4. the NSABP tamoxifen prevention P1 trial had 826 patient with LCIS after 7 years of followup, he and your weight of development of invasive breast cancer in the placebo group was 1.1% and was twice that of tamoxifen group.  5. In a series of cases of lobular neoplasia reported to the [facility] between 1973 and 1998 7% developed invasive Carcinoma by 10 years.  6. patient with LCIS are candidates for breast cancer chemopreventive therapy surveillance alone without medical therapy is also an option. He options for treatment include selective estrogen receptor modulator is an aromatase inhibitors. Tamoxifen was the mainstay of therapy but now aromatase inhibitors such as Aromasin and Arimidex and also approved for this indication.  7. aromatase inhibitors offer a better safety profile and more efficacy  8. Phase-3 results from the MAP.3 trial showed that the aromatase  inhibitor exemestane reduced the risk for developing breast cancer by 65% in women at high-risk.  Additionally, researchers observed a 60% reduction in invasive breast cancer plus pre-invasive ductal carcinoma in situ, and fewer cases of cancer precursor lesions such as atypical ductal hyperplasia and atypical lobular hyperplasia in the exemestane group. Exemestane can be considered a new option for breast cancer prevention in post menopausal women. Women meeting the inclusion criteria of the MAP.3 trial, which would  include all women. These findings first announced at 2011  ASCO Annual Meeting. Patients assigned to exemestane had superior outcomes when assessed by Baker Janus score, age, BMI, prior LCIS and prior DCIS. The annual incidence rate of invasive breast cancer or DCIS was 0.35% with exemestane and 0.77% with placebo (HR=0.47; 95% CI 0.27-0.79). There were 20 diagnoses of invasive breast cancer or DCIS in the exemestane group vs. 44 in the placebo group. The study later got reported in [place] 2011; [id number] June 23,2011.        PLAN:   #1. Patient to start Aromasin 25 mg by mouth daily. The side effects were discussed and include but are not limited to hot flashes arthralgias resolved bone loss osteopenia, osteoporosis, hyperlipidemia. In contrast to tamoxifen it does not increase the risk for DVT or uterine cancer. I will get a baseline bone density test as according to patient about 2 years ago she did have osteopenia. #2 patient will continue taking calcium and vitamin D twice a day. #3 she will continue weightbearing exercises. #4 next diagnostic mammogram will be due in March 2016. #5 I will see her back in 3 months and before a visit we'll check her labs including CBC, chemistry panel and a lipid panel. Her last cholesterol was 203, LDL 111 and HDL  was 84 .  All questions were answered. The patient knows to call the clinic with any problems, questions or concerns. I spent 30  MINUTES counseling the patient face to face. The total time spent in the appointment was 20 MINS and more than 50% was on counseling.   Bernadene Bell, MD Medical Hematologist/Oncologist [place] Pager: [phone] Office No: [phone]    04/06/2014 11:13 AM

## 2014-04-07 ENCOUNTER — Telehealth: Payer: Self-pay | Admitting: Hematology

## 2014-04-07 NOTE — Telephone Encounter (Signed)
cld & spoke to pt in re to sch-sch BD-cld and gave pt time & date for appt-mailed sch

## 2014-04-26 ENCOUNTER — Encounter: Payer: BC Managed Care – PPO | Admitting: Oncology

## 2014-05-10 ENCOUNTER — Encounter: Payer: Self-pay | Admitting: Internal Medicine

## 2014-06-15 ENCOUNTER — Encounter: Payer: Self-pay | Admitting: Obstetrics & Gynecology

## 2014-06-15 ENCOUNTER — Ambulatory Visit (INDEPENDENT_AMBULATORY_CARE_PROVIDER_SITE_OTHER): Payer: BC Managed Care – PPO | Admitting: Obstetrics & Gynecology

## 2014-06-15 VITALS — BP 110/62 | HR 52 | Resp 16 | Ht 61.0 in | Wt 120.0 lb

## 2014-06-15 DIAGNOSIS — Z124 Encounter for screening for malignant neoplasm of cervix: Secondary | ICD-10-CM

## 2014-06-15 DIAGNOSIS — Z01419 Encounter for gynecological examination (general) (routine) without abnormal findings: Secondary | ICD-10-CM

## 2014-06-15 NOTE — Progress Notes (Signed)
Patient ID: Robin Barton, female   DOB: [birth year], 58 y.o.   MRN: [id number]  58 y.o. G2P2 MarriedCaucasianF here for annual exam.  Looking for part time job.  Diagnosed with lobular carcinoma in-situ.  Had lumpectomy.  Starting Aromasin.  Has questions about this and side effects.  BMD just done.  Reviewed with pt.  Dr. Nelda Severe.  Saw 4/15.  Labs done then.    Denies vaginal bleeding.    Patient's last menstrual period was 09/24/1999.          Sexually active: yes  The current method of family planning is vasectomy.  Exercising: yes Home exercise routine includes walking and gardening.  Smoker: yes, 1/2 ppd  Health Maintenance:  Pap: 05/20/12, WNL, neg HR HPV  History of abnormal Pap: no  MMG: 11/23/13, 3D, biopsy - ATYPICAL LOBULAR HYPERPLASIA, lumpectomy 12/10/13  Colonoscopy: 1/12, Dr. Olevia Perches, repeat in 5 years  BMD: 06/13/14, no results at time of visit  TDaP: 05/07/09 Screening Labs: PCP (in EPIC), Hb today: PCP, Urine today: PCP   reports that she has been smoking Cigarettes.  She has been smoking about 0.50 packs per day. She has never used smokeless tobacco. She reports that she drinks about 2.5 - 3 ounces of alcohol per week. She reports that she does not use illicit drugs.  Past Medical History  Diagnosis Date  . Hypertension   . Thyroid disease     rt thyroid nodule  . Hyperlipidemia   . Osteopenia   . BCC (basal cell carcinoma)   . Acid reflux   . Depression   . Wears glasses     Past Surgical History  Procedure Laterality Date  . Bunionectomy  2/13 left foot and toe straightened  . Bunionectomy  3/13 right foot and toe straightened  . Nasal septum surgery  1982  . Colonoscopy    . Breast lumpectomy with radioactive seed localization Left 12/10/2013    Procedure: BREAST LUMPECTOMY WITH RADIOACTIVE SEED LOCALIZATION;  Surgeon: [name], MD;  Location: [facility];  Service: General;  Laterality: Left;  . Breast surgery       Current Outpatient Prescriptions  Medication Sig Dispense Refill  . atenolol (TENORMIN) 25 MG tablet Take 1 tablet (25 mg total) by mouth daily.  30 tablet  2  . calcium carbonate (OS-CAL - DOSED IN MG OF ELEMENTAL CALCIUM) 1250 MG tablet Take 1 tablet by mouth daily.        . Cholecalciferol (VITAMIN D) 1000 UNITS capsule Take 1,000 Units by mouth daily.        Marland Kitchen exemestane (AROMASIN) 25 MG tablet Take 1 tablet (25 mg total) by mouth daily after breakfast.  90 tablet  3  . HYDROcodone-acetaminophen (NORCO/VICODIN) 5-325 MG per tablet Take 1-2 tablets by mouth every 4 (four) hours as needed.  30 tablet  0  . ibuprofen (ADVIL,MOTRIN) 800 MG tablet Take 1 tablet (800 mg total) by mouth every 8 (eight) hours as needed for pain.  30 tablet  5  . lisinopril (PRINIVIL,ZESTRIL) 40 MG tablet Take 1 tablet (40 mg total) by mouth daily.  30 tablet  2  . PARoxetine (PAXIL) 20 MG tablet Take 1 tablet (20 mg total) by mouth every morning.  30 tablet  13  . simvastatin (ZOCOR) 40 MG tablet Take 40 mg by mouth every evening.      . traZODone (DESYREL) 50 MG tablet Take 1 tablet (50 mg total) by mouth at bedtime.  30 tablet  PRN   No current facility-administered medications for this visit.    Family History  Problem Relation Age of Onset  . Cancer Mother     colon-stage 4  . Diabetes Sister     borderline  . Breast cancer Sister 1    double mastectomy  . Depression Sister   . Hypertension Mother   . Hypertension Sister   . Angina Maternal Grandfather   . Osteoporosis Sister     ROS:  Pertinent items are noted in HPI.  Otherwise, a comprehensive ROS was negative.  Exam:   LMP 09/24/1999  Weight change: @WEIGHTCHANGE @ Height:      Ht Readings from Last 3 Encounters:  04/06/14 5\' 2"  (1.575 m)  12/27/13 5\' 2"  (1.575 m)  12/10/13 5\' 2"  (1.575 m)    General appearance: alert, cooperative and appears stated age Head: Normocephalic, without obvious abnormality, atraumatic Neck: no  adenopathy, supple, symmetrical, trachea midline and thyroid normal to inspection and palpation Lungs: clear to auscultation bilaterally Breasts: normal appearance, no masses or tenderness Heart: regular rate and rhythm Abdomen: soft, non-tender; bowel sounds normal; no masses,  no organomegaly Extremities: extremities normal, atraumatic, no cyanosis or edema Skin: Skin color, texture, turgor normal. No rashes or lesions Lymph nodes: Cervical, supraclavicular, and axillary nodes normal. No abnormal inguinal nodes palpated Neurologic: Grossly normal   Pelvic: External genitalia:  no lesions              Urethra:  normal appearing urethra with no masses, tenderness or lesions              Bartholins and Skenes: normal                 Vagina: normal appearing vagina with normal color and discharge, no lesions              Cervix: no lesions              Pap taken: Yes.   Bimanual Exam:  Uterus:  normal size, contour, position, consistency, mobility, non-tender              Adnexa: normal adnexa and no mass, fullness, tenderness               Rectovaginal: Confirms               Anus:  normal sphincter tone, no lesions  A:  Well Woman with normal exam  PMP, no HRT  Hypertension  H/O BCC of skin   P: Mammogram yearly. Did 3D 2/14.  Does not needed rx for Paxil or ibuprofen this year. pap smear with neg HR HPV 8/13.  Pap today.  Pt may want yearly paps.  Will discuss yearly with her.   Labs with PCP  Sees dermatology every six months  return annually or prn  An After Visit Summary was printed and given to the patient.

## 2014-06-17 LAB — IPS PAP TEST WITH REFLEX TO HPV

## 2014-06-24 ENCOUNTER — Ambulatory Visit: Payer: BC Managed Care – PPO | Admitting: Obstetrics & Gynecology

## 2014-06-27 ENCOUNTER — Telehealth: Payer: Self-pay

## 2014-06-27 NOTE — Telephone Encounter (Signed)
Bone density results rcvd from [facility] dtd 06/13/14 Dr Marcelo Baldy.  Copy to Dr Lindi Adie, Original to scan.

## 2014-07-01 ENCOUNTER — Telehealth: Payer: Self-pay | Admitting: Hematology and Oncology

## 2014-07-01 NOTE — Telephone Encounter (Signed)
returned pt call and s.w pt  r/s appt per pt request..Marland KitchenMarland KitchenMarland Kitchenpt ok adn aware

## 2014-07-04 ENCOUNTER — Other Ambulatory Visit: Payer: BC Managed Care – PPO

## 2014-07-04 ENCOUNTER — Ambulatory Visit: Payer: BC Managed Care – PPO | Admitting: Hematology and Oncology

## 2014-07-06 ENCOUNTER — Other Ambulatory Visit: Payer: Self-pay

## 2014-07-06 MED ORDER — PAROXETINE HCL 20 MG PO TABS: 20.0000 mg | ORAL_TABLET | ORAL | Status: DC

## 2014-07-06 NOTE — Telephone Encounter (Signed)
Last AEX: 06/15/14 Last refill:06/11/13 #30 X 13 Current AEX:08/03/15  Please advise

## 2014-07-07 ENCOUNTER — Encounter: Payer: Self-pay | Admitting: Internal Medicine

## 2014-07-11 ENCOUNTER — Ambulatory Visit: Payer: BC Managed Care – PPO | Admitting: Hematology and Oncology

## 2014-07-11 ENCOUNTER — Other Ambulatory Visit: Payer: BC Managed Care – PPO

## 2014-07-18 ENCOUNTER — Other Ambulatory Visit: Payer: Self-pay

## 2014-07-18 NOTE — Telephone Encounter (Signed)
Incoming Refill Request from [facility] KW:[id number] 800mg   Last AEX:06/15/14 Last Refill:06/11/13 #30 X 5 Next AEX:08/03/15   Please Advise

## 2014-07-19 ENCOUNTER — Other Ambulatory Visit (HOSPITAL_BASED_OUTPATIENT_CLINIC_OR_DEPARTMENT_OTHER): Payer: BC Managed Care – PPO

## 2014-07-19 ENCOUNTER — Telehealth: Payer: Self-pay | Admitting: Hematology and Oncology

## 2014-07-19 ENCOUNTER — Ambulatory Visit (HOSPITAL_BASED_OUTPATIENT_CLINIC_OR_DEPARTMENT_OTHER): Payer: BC Managed Care – PPO | Admitting: Hematology and Oncology

## 2014-07-19 VITALS — BP 131/72 | HR 57 | Temp 98.4°F | Resp 18 | Ht 61.0 in | Wt 121.0 lb

## 2014-07-19 DIAGNOSIS — D0502 Lobular carcinoma in situ of left breast: Secondary | ICD-10-CM

## 2014-07-19 DIAGNOSIS — Z79811 Long term (current) use of aromatase inhibitors: Secondary | ICD-10-CM

## 2014-07-19 LAB — COMPREHENSIVE METABOLIC PANEL (CC13)
ALBUMIN: 4.1 g/dL (ref 3.5–5.0)
ALT: 21 U/L (ref 0–55)
ANION GAP: 8 meq/L (ref 3–11)
AST: 23 U/L (ref 5–34)
Alkaline Phosphatase: 54 U/L (ref 40–150)
BUN: 22.8 mg/dL (ref 7.0–26.0)
CALCIUM: 9.6 mg/dL (ref 8.4–10.4)
CHLORIDE: 107 meq/L (ref 98–109)
CO2: 24 meq/L (ref 22–29)
Creatinine: 0.8 mg/dL (ref 0.6–1.1)
Glucose: 95 mg/dl (ref 70–140)
POTASSIUM: 4.7 meq/L (ref 3.5–5.1)
Sodium: 139 mEq/L (ref 136–145)
Total Bilirubin: 0.49 mg/dL (ref 0.20–1.20)
Total Protein: 7.3 g/dL (ref 6.4–8.3)

## 2014-07-19 LAB — CBC WITH DIFFERENTIAL/PLATELET
BASO%: 0.9 % (ref 0.0–2.0)
Basophils Absolute: 0 10*3/uL (ref 0.0–0.1)
EOS ABS: 0.1 10*3/uL (ref 0.0–0.5)
EOS%: 1.2 % (ref 0.0–7.0)
HEMATOCRIT: 37.8 % (ref 34.8–46.6)
HEMOGLOBIN: 12.5 g/dL (ref 11.6–15.9)
LYMPH%: 31.1 % (ref 14.0–49.7)
MCH: 31.8 pg (ref 25.1–34.0)
MCHC: 33.1 g/dL (ref 31.5–36.0)
MCV: 96 fL (ref 79.5–101.0)
MONO#: 0.6 10*3/uL (ref 0.1–0.9)
MONO%: 12.9 % (ref 0.0–14.0)
NEUT%: 53.9 % (ref 38.4–76.8)
NEUTROS ABS: 2.7 10*3/uL (ref 1.5–6.5)
Platelets: 370 10*3/uL (ref 145–400)
RBC: 3.94 10*6/uL (ref 3.70–5.45)
RDW: 12.8 % (ref 11.2–14.5)
WBC: 5 10*3/uL (ref 3.9–10.3)
lymph#: 1.5 10*3/uL (ref 0.9–3.3)

## 2014-07-19 LAB — LIPID PANEL
Cholesterol: 186 mg/dL (ref 0–200)
HDL: 98 mg/dL (ref >39)
LDL Cholesterol: 80 mg/dL (ref 0–99)
TRIGLYCERIDES: 40 mg/dL (ref <150)
Total CHOL/HDL Ratio: 1.9 Ratio
VLDL: 8 mg/dL (ref 0–40)

## 2014-07-19 MED ORDER — IBUPROFEN 800 MG PO TABS: 800.0000 mg | ORAL_TABLET | Freq: Three times a day (TID) | ORAL | Status: DC | PRN

## 2014-07-19 NOTE — Assessment & Plan Note (Signed)
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 Dalbert Batman Patient is currently on exemestane for breast cancer risk reduction. She is tolerating it fairly well except she forgets to take it quite often. She is not complaining of any side effects from treatment.  Osteopenia: I reviewed the bone density test which showed a bone density ranging from -1.4 to -1.8. I recommended continuing calcium and vitamin D on repeating bone density in 2 years.  Return to clinic in 6 months for followup and breast exam.

## 2014-07-19 NOTE — Progress Notes (Signed)
Patient Care Team: Velna Hatchet, MD as PCP - General (Internal Medicine)  DIAGNOSIS: LCIS. First she had a biopsy done on 11/25/2013 followed by lumpectomy on 12/10/2013 by Dr. Dalbert Batman.  CHIEF COMPLIANT: Followup on exemestane  INTERVAL HISTORY: Robin Barton is a 22 with above-mentioned history of LCIS removed with lumpectomy in March 2015. She was started on exemestane by Dr. Lona Kettle and she has been tolerating it fairly well except that she forgets to take it periodically. She denies any hot flashes or myalgias.  REVIEW OF SYSTEMS:   Constitutional: Denies fevers, chills or abnormal weight loss Eyes: Denies blurriness of vision Ears, nose, mouth, throat, and face: Denies mucositis or sore throat Respiratory: Denies cough, dyspnea or wheezes Cardiovascular: Denies palpitation, chest discomfort or lower extremity swelling Gastrointestinal:  Denies nausea, heartburn or change in bowel habits Skin: Denies abnormal skin rashes Lymphatics: Denies new lymphadenopathy or easy bruising Neurological:Denies numbness, tingling or new weaknesses Behavioral/Psych: Mood is stable, no new changes  Breast:  denies any pain or lumps or nodules in either breasts All other systems were reviewed with the patient and are negative.  I have reviewed the past medical history, past surgical history, social history and family history with the patient and they are unchanged from previous note.  ALLERGIES:  is allergic to sulfonamide derivatives.  MEDICATIONS:  Current Outpatient Prescriptions  Medication Sig Dispense Refill  . atenolol (TENORMIN) 25 MG tablet Take 1 tablet (25 mg total) by mouth daily.  30 tablet  2  . calcium carbonate (OS-CAL - DOSED IN MG OF ELEMENTAL CALCIUM) 1250 MG tablet Take 1 tablet by mouth daily.        . Cholecalciferol (VITAMIN D) 1000 UNITS capsule Take 1,000 Units by mouth daily.        Marland Kitchen exemestane (AROMASIN) 25 MG tablet Take 1 tablet (25 mg total) by mouth daily  after breakfast.  90 tablet  3  . ibuprofen (ADVIL,MOTRIN) 800 MG tablet Take 1 tablet (800 mg total) by mouth every 8 (eight) hours as needed for pain.  30 tablet  5  . lisinopril (PRINIVIL,ZESTRIL) 40 MG tablet Take 1 tablet (40 mg total) by mouth daily.  30 tablet  2  . PARoxetine (PAXIL) 20 MG tablet Take 1 tablet (20 mg total) by mouth every morning.  30 tablet  13  . simvastatin (ZOCOR) 40 MG tablet Take 40 mg by mouth every evening.      . traZODone (DESYREL) 50 MG tablet Take 1 tablet (50 mg total) by mouth at bedtime.  30 tablet  PRN   No current facility-administered medications for this visit.    PHYSICAL EXAMINATION: ECOG PERFORMANCE STATUS: 0 - Asymptomatic  Filed Vitals:   07/19/14 0918  BP: 131/72  Pulse: 57  Temp: 98.4 F (36.9 C)  Resp: 18   Filed Weights   07/19/14 0918  Weight: 121 lb (54.885 kg)    GENERAL:alert, no distress and comfortable SKIN: skin color, texture, turgor are normal, no rashes or significant lesions EYES: normal, Conjunctiva are pink and non-injected, sclera clear OROPHARYNX:no exudate, no erythema and lips, buccal mucosa, and tongue normal  NECK: supple, thyroid normal size, non-tender, without nodularity LYMPH:  no palpable lymphadenopathy in the cervical, axillary or inguinal LUNGS: clear to auscultation and percussion with normal breathing effort HEART: regular rate & rhythm and no murmurs and no lower extremity edema ABDOMEN:abdomen soft, non-tender and normal bowel sounds Musculoskeletal:no cyanosis of digits and no clubbing  NEURO: alert & oriented x 3  with fluent speech, no focal motor/sensory deficits  LABORATORY DATA:  I have reviewed the data as listed   Chemistry      Component Value Date/Time   NA 139 07/19/2014 0903   NA 140 12/08/2013 1120   K 4.7 07/19/2014 0903   K 4.7 12/08/2013 1120   CL 102 12/08/2013 1120   CO2 24 07/19/2014 0903   CO2 27 12/08/2013 1120   BUN 22.8 07/19/2014 0903   BUN 17 12/08/2013 1120    CREATININE 0.8 07/19/2014 0903   CREATININE 0.67 12/08/2013 1120   CREATININE 0.84 04/05/2011 0923      Component Value Date/Time   CALCIUM 9.6 07/19/2014 0903   CALCIUM 9.8 12/08/2013 1120   ALKPHOS 54 07/19/2014 0903   ALKPHOS 46 10/08/2011 0937   AST 23 07/19/2014 0903   AST 28 10/08/2011 0937   ALT 21 07/19/2014 0903   ALT 26 10/08/2011 0937   BILITOT 0.49 07/19/2014 0903   BILITOT 0.4 10/08/2011 0937       Lab Results  Component Value Date   WBC 5.0 07/19/2014   HGB 12.5 07/19/2014   HCT 37.8 07/19/2014   MCV 96.0 07/19/2014   PLT 370 07/19/2014   NEUTROABS 2.7 07/19/2014    ASSESSMENT & PLAN:  Neoplasm of left breast, 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 Dalbert Batman Patient is currently on exemestane for breast cancer risk reduction. She is tolerating it fairly well except she forgets to take it quite often. She is not complaining of any side effects from treatment.  Osteopenia: I reviewed the bone density test which showed a bone density ranging from -1.4 to -1.8. I recommended continuing calcium and vitamin D on repeating bone density in 2 years.  Return to clinic in 6 months for followup and breast exam.   No orders of the defined types were placed in this encounter.   The patient has a good understanding of the overall plan. she agrees with it. She will call with any problems that may develop before her next visit here.  I spent 15 minutes counseling the patient face to face. The total time spent in the appointment was 20 minutes and more than 50% was on counseling and review of test results    Rulon Eisenmenger, MD 07/19/2014 10:20 AM

## 2014-07-25 ENCOUNTER — Encounter: Payer: Self-pay | Admitting: Obstetrics & Gynecology

## 2014-08-12 ENCOUNTER — Encounter: Payer: Self-pay | Admitting: Internal Medicine

## 2014-12-27 ENCOUNTER — Telehealth: Payer: Self-pay

## 2014-12-27 NOTE — Telephone Encounter (Signed)
Mammogram report dtd 12/15/14 rcvd from [facility].  Reviewed by Dr. Lindi Adie.  Sent to scan.

## 2015-01-16 ENCOUNTER — Telehealth: Payer: Self-pay | Admitting: Hematology and Oncology

## 2015-01-16 NOTE — Telephone Encounter (Signed)
Patient called in to reschedule her appointment °

## 2015-01-17 ENCOUNTER — Ambulatory Visit: Payer: BC Managed Care – PPO | Admitting: Hematology and Oncology

## 2015-02-01 ENCOUNTER — Ambulatory Visit (HOSPITAL_BASED_OUTPATIENT_CLINIC_OR_DEPARTMENT_OTHER): Payer: BLUE CROSS/BLUE SHIELD | Admitting: Hematology and Oncology

## 2015-02-01 ENCOUNTER — Telehealth: Payer: Self-pay | Admitting: Hematology and Oncology

## 2015-02-01 VITALS — BP 129/61 | HR 60 | Temp 97.7°F | Resp 18 | Ht 61.0 in | Wt 124.8 lb

## 2015-02-01 DIAGNOSIS — D0502 Lobular carcinoma in situ of left breast: Secondary | ICD-10-CM | POA: Diagnosis not present

## 2015-02-01 NOTE — Progress Notes (Signed)
Patient Care Team: Velna Hatchet, MD as PCP - General (Internal Medicine)  DIAGNOSIS: LCIS. First she had a biopsy done on 11/25/2013 followed by lumpectomy on 12/10/2013 by Dr. Dalbert Batman.  CHIEF COMPLIANT: Followup on exemestane since April 2015  INTERVAL HISTORY: Robin Barton is a 59 year old lady with above-mentioned history of LCIS for which she is on exemestane for breast cancer risk reduction. She is tolerating exemestane fairly well except for hot flashes which seem to wake her up at night. Other than that she has no complaints or concerns. She had a recent mammogram end of March 2016 which was normal. She had a breast density category B. She denies any lumps or nodules in the breast. She stays very busy doing her gardening. She relates lots of roses.  REVIEW OF SYSTEMS:   Constitutional: Denies fevers, chills or abnormal weight loss Eyes: Denies blurriness of vision Ears, nose, mouth, throat, and face: Denies mucositis or sore throat Respiratory: Denies cough, dyspnea or wheezes Cardiovascular: Denies palpitation, chest discomfort or lower extremity swelling Gastrointestinal:  Denies nausea, heartburn or change in bowel habits Skin: Denies abnormal skin rashes Lymphatics: Denies new lymphadenopathy or easy bruising Neurological:Denies numbness, tingling or new weaknesses Behavioral/Psych: Mood is stable, no new changes  Breast:  denies any pain or lumps or nodules in either breasts All other systems were reviewed with the patient and are negative.  I have reviewed the past medical history, past surgical history, social history and family history with the patient and they are unchanged from previous note.  ALLERGIES:  is allergic to sulfonamide derivatives.  MEDICATIONS:  Current Outpatient Prescriptions  Medication Sig Dispense Refill  . atenolol (TENORMIN) 25 MG tablet Take 1 tablet (25 mg total) by mouth daily. 30 tablet 2  . calcium carbonate (OS-CAL - DOSED IN MG OF  ELEMENTAL CALCIUM) 1250 MG tablet Take 1 tablet by mouth daily.      . Cholecalciferol (VITAMIN D) 1000 UNITS capsule Take 1,000 Units by mouth daily.      Marland Kitchen exemestane (AROMASIN) 25 MG tablet Take 1 tablet (25 mg total) by mouth daily after breakfast. 90 tablet 3  . ibuprofen (ADVIL,MOTRIN) 800 MG tablet Take 1 tablet (800 mg total) by mouth every 8 (eight) hours as needed. 30 tablet 5  . lisinopril (PRINIVIL,ZESTRIL) 40 MG tablet Take 1 tablet (40 mg total) by mouth daily. 30 tablet 2  . PARoxetine (PAXIL) 20 MG tablet Take 1 tablet (20 mg total) by mouth every morning. 30 tablet 13  . simvastatin (ZOCOR) 40 MG tablet Take 40 mg by mouth every evening.    . traZODone (DESYREL) 50 MG tablet Take 1 tablet (50 mg total) by mouth at bedtime. 30 tablet PRN   No current facility-administered medications for this visit.    PHYSICAL EXAMINATION: ECOG PERFORMANCE STATUS: 0 - Asymptomatic  Filed Vitals:   02/01/15 1511  BP: 129/61  Pulse: 60  Temp: 97.7 F (36.5 C)  Resp: 18   Filed Weights   02/01/15 1511  Weight: 124 lb 12.8 oz (56.609 kg)    GENERAL:alert, no distress and comfortable SKIN: skin color, texture, turgor are normal, no rashes or significant lesions EYES: normal, Conjunctiva are pink and non-injected, sclera clear OROPHARYNX:no exudate, no erythema and lips, buccal mucosa, and tongue normal  NECK: supple, thyroid normal size, non-tender, without nodularity LYMPH:  no palpable lymphadenopathy in the cervical, axillary or inguinal LUNGS: clear to auscultation and percussion with normal breathing effort HEART: regular rate & rhythm and no  murmurs and no lower extremity edema ABDOMEN:abdomen soft, non-tender and normal bowel sounds Musculoskeletal:no cyanosis of digits and no clubbing  NEURO: alert & oriented x 3 with fluent speech, no focal motor/sensory deficits BREAST: No palpable masses or nodules in either right or left breasts. No palpable axillary supraclavicular or  infraclavicular adenopathy no breast tenderness or nipple discharge. (exam performed in the presence of a chaperone)  LABORATORY DATA:  I have reviewed the data as listed   Chemistry      Component Value Date/Time   NA 139 07/19/2014 0903   NA 140 12/08/2013 1120   K 4.7 07/19/2014 0903   K 4.7 12/08/2013 1120   CL 102 12/08/2013 1120   CO2 24 07/19/2014 0903   CO2 27 12/08/2013 1120   BUN 22.8 07/19/2014 0903   BUN 17 12/08/2013 1120   CREATININE 0.8 07/19/2014 0903   CREATININE 0.67 12/08/2013 1120   CREATININE 0.84 04/05/2011 0923      Component Value Date/Time   CALCIUM 9.6 07/19/2014 0903   CALCIUM 9.8 12/08/2013 1120   ALKPHOS 54 07/19/2014 0903   ALKPHOS 46 10/08/2011 0937   AST 23 07/19/2014 0903   AST 28 10/08/2011 0937   ALT 21 07/19/2014 0903   ALT 26 10/08/2011 0937   BILITOT 0.49 07/19/2014 0903   BILITOT 0.4 10/08/2011 0937       Lab Results  Component Value Date   WBC 5.0 07/19/2014   HGB 12.5 07/19/2014   HCT 37.8 07/19/2014   MCV 96.0 07/19/2014   PLT 370 07/19/2014   NEUTROABS 2.7 07/19/2014     RADIOGRAPHIC STUDIES: I have personally reviewed the radiology reports and agreed with their findings. 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 Dalbert Batman, exemestane started April 2015  Exemestane toxicities: Denies any hot flashes myalgias  Breast Cancer Surveillance: 1. Breast exam 02/01/2015: Normal 2. Mammogram 12/15/2014 No abnormalities. Postsurgical changes. Breast Density Category B. I recommended that she get 3-D mammograms for surveillance. Discussed the differences between different breast density categories.   Return to clinic in 6 months for follow-up      No orders of the defined types were placed in this encounter.   The patient has a good  understanding of the overall plan. she agrees with it. she will call with any problems that may develop before the next visit here.   Rulon Eisenmenger, MD

## 2015-02-01 NOTE — Telephone Encounter (Signed)
Appointments made and avs printed for patient °

## 2015-02-01 NOTE — Assessment & Plan Note (Addendum)
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 Dalbert Batman, exemestane started September 2015  Exemestane toxicities: Denies any hot flashes myalgias  Breast Cancer Surveillance: 1. Breast exam 02/01/2015: Normal 2. Mammogram 12/15/2014 No abnormalities. Postsurgical changes. Breast Density Category B. I recommended that she get 3-D mammograms for surveillance. Discussed the differences between different breast density categories.   Return to clinic in 6 months for follow-up

## 2015-05-05 ENCOUNTER — Other Ambulatory Visit: Payer: Self-pay

## 2015-05-05 MED ORDER — IBUPROFEN 800 MG PO TABS: 800.0000 mg | ORAL_TABLET | Freq: Three times a day (TID) | ORAL | Status: DC | PRN

## 2015-05-05 NOTE — Telephone Encounter (Signed)
Medication refill request: Ibuprofen 800  Last AEX:  06/15/14 SM Next AEX: 08/03/15 SM Last MMG (if hormonal medication request):  Refill authorized: Please advise.

## 2015-07-04 ENCOUNTER — Telehealth: Payer: Self-pay | Admitting: *Deleted

## 2015-07-04 DIAGNOSIS — D0502 Lobular carcinoma in situ of left breast: Secondary | ICD-10-CM

## 2015-07-04 MED ORDER — EXEMESTANE 25 MG PO TABS: 25.0000 mg | ORAL_TABLET | Freq: Every day | ORAL | Status: DC

## 2015-07-04 NOTE — Telephone Encounter (Signed)
Call from this patient reporting "out of exemestane for the past two weeks and thought I am to take this for five years.  Pharmacy has faxed refill request with no response.  I also need an appointment with him."    This nurse will re-order as current chart order under Dr. Lona Kettle.  Appointment scheduled on 08-01-2015 at 10:15 am.  Patient wrote down this appointment information provided.

## 2015-08-01 ENCOUNTER — Telehealth: Payer: Self-pay | Admitting: Hematology and Oncology

## 2015-08-01 ENCOUNTER — Ambulatory Visit (HOSPITAL_BASED_OUTPATIENT_CLINIC_OR_DEPARTMENT_OTHER): Payer: BLUE CROSS/BLUE SHIELD | Admitting: Hematology and Oncology

## 2015-08-01 ENCOUNTER — Encounter: Payer: Self-pay | Admitting: Hematology and Oncology

## 2015-08-01 VITALS — BP 169/74 | HR 50 | Temp 97.7°F | Resp 18 | Ht 61.0 in | Wt 124.5 lb

## 2015-08-01 DIAGNOSIS — Z86000 Personal history of in-situ neoplasm of breast: Secondary | ICD-10-CM | POA: Diagnosis not present

## 2015-08-01 DIAGNOSIS — D0502 Lobular carcinoma in situ of left breast: Secondary | ICD-10-CM

## 2015-08-01 NOTE — Assessment & Plan Note (Signed)
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 Dalbert Batman, exemestane started April 2015  Exemestane toxicities: Denies any hot flashes myalgias  Breast Cancer Surveillance: 1. Breast exam 08/01/2015: Normal 2. Mammogram 12/15/2014 No abnormalities. Postsurgical changes. Breast Density Category B. I recommended that she get 3-D mammograms for surveillance. Discussed the differences between different breast density categories.  Return to clinic in 1 year for follow-up

## 2015-08-01 NOTE — Progress Notes (Signed)
Patient Care Team: Velna Hatchet, MD as PCP - General (Internal Medicine)  DIAGNOSIS: LCIS.  biopsy done on 11/25/2013 followed by lumpectomy on 12/10/2013 by Dr. Dalbert Batman.  CHIEF COMPLIANT: Followup on exemestane since April 2015  INTERVAL HISTORY: Robin Barton is a 59 year old with above-mentioned history of LCIS currently on exemestane since April 2015 and she appears to be tolerating it very well. Occasional hot flashes and myalgias. Mammograms are scheduled to happen next week.  REVIEW OF SYSTEMS:   Constitutional: Denies fevers, chills or abnormal weight loss Eyes: Denies blurriness of vision Ears, nose, mouth, throat, and face: Denies mucositis or sore throat Respiratory: Denies cough, dyspnea or wheezes Cardiovascular: Denies palpitation, chest discomfort or lower extremity swelling Gastrointestinal:  Denies nausea, heartburn or change in bowel habits Skin: Denies abnormal skin rashes Lymphatics: Denies new lymphadenopathy or easy bruising Neurological:Denies numbness, tingling or new weaknesses Behavioral/Psych: Mood is stable, no new changes  Breast:  denies any pain or lumps or nodules in either breasts All other systems were reviewed with the patient and are negative.  I have reviewed the past medical history, past surgical history, social history and family history with the patient and they are unchanged from previous note.  ALLERGIES:  is allergic to sulfonamide derivatives.  MEDICATIONS:  Current Outpatient Prescriptions  Medication Sig Dispense Refill  . alendronate (FOSAMAX) 70 MG tablet   0  . atenolol (TENORMIN) 25 MG tablet Take 1 tablet (25 mg total) by mouth daily. 30 tablet 2  . calcium carbonate (OS-CAL - DOSED IN MG OF ELEMENTAL CALCIUM) 1250 MG tablet Take 1 tablet by mouth daily.      . Cholecalciferol (VITAMIN D) 1000 UNITS capsule Take 1,000 Units by mouth daily.      Marland Kitchen exemestane (AROMASIN) 25 MG tablet Take 1 tablet (25 mg total) by mouth  daily after breakfast. 60 tablet 1  . ibuprofen (ADVIL,MOTRIN) 800 MG tablet Take 1 tablet (800 mg total) by mouth every 8 (eight) hours as needed. 30 tablet 1  . lisinopril (PRINIVIL,ZESTRIL) 40 MG tablet Take 1 tablet (40 mg total) by mouth daily. 30 tablet 2  . PARoxetine (PAXIL) 20 MG tablet Take 1 tablet (20 mg total) by mouth every morning. 30 tablet 13  . simvastatin (ZOCOR) 40 MG tablet Take 40 mg by mouth every evening.    . traZODone (DESYREL) 50 MG tablet Take 1 tablet (50 mg total) by mouth at bedtime. 30 tablet PRN   No current facility-administered medications for this visit.    PHYSICAL EXAMINATION: ECOG PERFORMANCE STATUS: 0 - Asymptomatic  Filed Vitals:   08/01/15 1001  BP: 169/74  Pulse: 50  Temp: 97.7 F (36.5 C)  Resp: 18   Filed Weights   08/01/15 1001  Weight: 124 lb 8 oz (56.473 kg)    GENERAL:alert, no distress and comfortable SKIN: skin color, texture, turgor are normal, no rashes or significant lesions EYES: normal, Conjunctiva are pink and non-injected, sclera clear OROPHARYNX:no exudate, no erythema and lips, buccal mucosa, and tongue normal  NECK: supple, thyroid normal size, non-tender, without nodularity LYMPH:  no palpable lymphadenopathy in the cervical, axillary or inguinal LUNGS: clear to auscultation and percussion with normal breathing effort HEART: regular rate & rhythm and no murmurs and no lower extremity edema ABDOMEN:abdomen soft, non-tender and normal bowel sounds Musculoskeletal:no cyanosis of digits and no clubbing  NEURO: alert & oriented x 3 with fluent speech, no focal motor/sensory deficits BREAST: No palpable masses or nodules in either right or  left breasts. No palpable axillary supraclavicular or infraclavicular adenopathy no breast tenderness or nipple discharge. (exam performed in the presence of a chaperone)  LABORATORY DATA:  I have reviewed the data as listed   Chemistry      Component Value Date/Time   NA 139  07/19/2014 0903   NA 140 12/08/2013 1120   K 4.7 07/19/2014 0903   K 4.7 12/08/2013 1120   CL 102 12/08/2013 1120   CO2 24 07/19/2014 0903   CO2 27 12/08/2013 1120   BUN 22.8 07/19/2014 0903   BUN 17 12/08/2013 1120   CREATININE 0.8 07/19/2014 0903   CREATININE 0.67 12/08/2013 1120   CREATININE 0.84 04/05/2011 0923      Component Value Date/Time   CALCIUM 9.6 07/19/2014 0903   CALCIUM 9.8 12/08/2013 1120   ALKPHOS 54 07/19/2014 0903   ALKPHOS 46 10/08/2011 0937   AST 23 07/19/2014 0903   AST 28 10/08/2011 0937   ALT 21 07/19/2014 0903   ALT 26 10/08/2011 0937   BILITOT 0.49 07/19/2014 0903   BILITOT 0.4 10/08/2011 0937       Lab Results  Component Value Date   WBC 5.0 07/19/2014   HGB 12.5 07/19/2014   HCT 37.8 07/19/2014   MCV 96.0 07/19/2014   PLT 370 07/19/2014   NEUTROABS 2.7 07/19/2014   ASSESSMENT & PLAN:  Neoplasm of left breast, 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 Dalbert Batman, exemestane started April 2015  Exemestane toxicities:  1. Occasional hot flashes 2. Mild joint aches  Breast Cancer Surveillance: 1. Breast exam 08/01/2015: Normal 2. Mammogram 12/15/2014 No abnormalities. Postsurgical changes. Breast Density Category B. I recommended that she get 3-D mammograms for surveillance. Discussed the differences between different breast density categories.  Return to clinic in 6 months for follow-up and after that we will see her once a year    No orders of the defined types were placed in this encounter.   The patient has a good understanding of the overall plan. she agrees with it. she will call with any problems that may develop before the next visit here.   Rulon Eisenmenger, MD 08/01/2015     There

## 2015-08-01 NOTE — Telephone Encounter (Signed)
Appointments made and avs printed for patient °

## 2015-08-03 ENCOUNTER — Encounter: Payer: Self-pay | Admitting: Obstetrics & Gynecology

## 2015-08-03 ENCOUNTER — Ambulatory Visit (INDEPENDENT_AMBULATORY_CARE_PROVIDER_SITE_OTHER): Payer: BLUE CROSS/BLUE SHIELD | Admitting: Obstetrics & Gynecology

## 2015-08-03 VITALS — BP 138/78 | HR 60 | Resp 20 | Ht 61.25 in | Wt 121.0 lb

## 2015-08-03 DIAGNOSIS — Z01419 Encounter for gynecological examination (general) (routine) without abnormal findings: Secondary | ICD-10-CM | POA: Diagnosis not present

## 2015-08-03 DIAGNOSIS — M25552 Pain in left hip: Secondary | ICD-10-CM

## 2015-08-03 NOTE — Progress Notes (Signed)
59 y.o. [id number] MarriedCaucasianF here for annual exam.  Doing well. No vaginal bleeding.   On Aromasin.  Starting in September, 2015.  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    Pt has questions about BMD and starting fosamax.  Pt thinks she had another BMD in April.  Fosamax was recommended by Dr. Jackelyn Poling.  Pt would like my opinion regarding this.    Husband was diagnosed with prostate cancer.  Had radiation for this.  They are doing well from this point.  Dr. Alinda Money took care of him.  They were very please with care.  PCP:  Dr. Konrad Saha  Patient's last menstrual period was 09/24/1999.          Sexually active: Yes.    The current method of family planning is post menopausal status.   Exercising: Yes.    walking and gardening Smoker:  Yes-occ  Health Maintenance: Pap:  06/15/14, Negative, 05/20/12 neg pap with neg HR HPV History of abnormal Pap:  no MMG:  12/15/14, 3D DX BI-RADS 1, Negative Colonoscopy:  1/12, Dr. Olevia Perches, Repeat in 5 years BMD:   4/16 Dr Hoover Brunette office-osteopenia, wanted her to start Fosamax TDaP:  05/07/09 Screening Labs: 07/19/14, in epic, Urine today: PCP   reports that she has been smoking Cigarettes.  She has been smoking about 0.50 packs per day. She has never used smokeless tobacco. She reports that she drinks about 2.5 - 3.0 oz of alcohol per week. She reports that she does not use illicit drugs.  Past Medical History  Diagnosis Date  . Hypertension   . Thyroid disease     rt thyroid nodule  . Hyperlipidemia   . Osteopenia   . BCC (basal cell carcinoma)   . Acid reflux   . Depression   . Wears glasses   . Atypical lobular hyperplasia of left breast 11/25/13    lumpectomy, 12/10/13    Past Surgical History  Procedure Laterality Date  . Bunionectomy  2/13 left foot and toe straightened  . Bunionectomy  3/13 right foot and toe straightened  . Nasal septum surgery  1982  . Colonoscopy    . Breast lumpectomy with radioactive seed localization  Left 12/10/2013    Procedure: BREAST LUMPECTOMY WITH RADIOACTIVE SEED LOCALIZATION;  Surgeon: [name], MD;  Location: [facility];  Service: General;  Laterality: Left;  . Breast surgery      Current Outpatient Prescriptions  Medication Sig Dispense Refill  . alendronate (FOSAMAX) 70 MG tablet   0  . atenolol (TENORMIN) 25 MG tablet Take 1 tablet (25 mg total) by mouth daily. 30 tablet 2  . calcium carbonate (OS-CAL - DOSED IN MG OF ELEMENTAL CALCIUM) 1250 MG tablet Take 1 tablet by mouth daily.      . Cholecalciferol (VITAMIN D) 1000 UNITS capsule Take 1,000 Units by mouth daily.      Marland Kitchen exemestane (AROMASIN) 25 MG tablet Take 1 tablet (25 mg total) by mouth daily after breakfast. 60 tablet 1  . ibuprofen (ADVIL,MOTRIN) 800 MG tablet Take 1 tablet (800 mg total) by mouth every 8 (eight) hours as needed. 30 tablet 1  . lisinopril (PRINIVIL,ZESTRIL) 40 MG tablet Take 1 tablet (40 mg total) by mouth daily. 30 tablet 2  . PARoxetine (PAXIL) 20 MG tablet Take 1 tablet (20 mg total) by mouth every morning. 30 tablet 13  . simvastatin (ZOCOR) 40 MG tablet Take 40 mg by mouth every evening.    . traZODone (DESYREL) 50 MG  tablet Take 1 tablet (50 mg total) by mouth at bedtime. 30 tablet PRN   No current facility-administered medications for this visit.    Family History  Problem Relation Age of Onset  . Cancer Mother     colon-stage 4  . Diabetes Sister     borderline  . Breast cancer Sister 93    double mastectomy  . Depression Sister   . Hypertension Mother   . Hypertension Sister   . Angina Maternal Grandfather   . Osteoporosis Sister     ROS:  Pertinent items are noted in HPI.  Otherwise, a comprehensive ROS was negative.  Exam:   General appearance: alert, cooperative and appears stated age Head: Normocephalic, without obvious abnormality, atraumatic Neck: no adenopathy, supple, symmetrical, trachea midline and thyroid normal to inspection and  palpation Lungs: clear to auscultation bilaterally Breasts:  Heart: regular rate and rhythm Abdomen: soft, non-tender; bowel sounds normal; no masses,  no organomegaly Extremities: extremities normal, atraumatic, no cyanosis or edema Skin: Skin color, texture, turgor normal. No rashes or lesions Lymph nodes: Cervical, supraclavicular, and axillary nodes normal. No abnormal inguinal nodes palpated Neurologic: Grossly normal   Pelvic: External genitalia:  no lesions              Urethra:  normal appearing urethra with no masses, tenderness or lesions              Bartholins and Skenes: normal                 Vagina: normal appearing vagina with normal color and discharge, no lesions              Cervix: no lesions              Pap taken: No. Bimanual Exam:  Uterus:  normal size, contour, position, consistency, mobility, non-tender              Adnexa: normal adnexa and no mass, fullness, tenderness               Rectovaginal: Confirms               Anus:  normal sphincter tone, no lesions  Chaperone was present for exam.  A:  Well Woman with normal exam  PMP, no HRT  Hypertension  H/O BCC of skin removed 2011. Lobar carcinoma in-situ, s/p lumpectomy 12/10/13 with Dr. Dalbert Batman.  Now on Aromasin for five years.  Started around 9/15. Left hip pain.  Has not had an ortho eval Osteopenia Family hx of colon cancer in her mother in early 72's  P: Mammogram yearly. Did 3D 2/14.  Does not needed rx for Paxil or ibuprofen this year.  Referral to [facility] made for left hip issues.   pap smear with neg HR HPV 8/13. pap 2015.  No pap today. Labs/vaccines with Dr. Jackelyn Poling Release signed for BMD.  I have one from 9/15 but pt thinks she had another in April of this year. Sees dermatology every six months  return annually or prn

## 2015-08-10 ENCOUNTER — Telehealth: Payer: Self-pay

## 2015-08-10 NOTE — Telephone Encounter (Signed)
Patient notified of [place] BMD results. Is aware we recommend repeating in 4/17 at [facility]. Also, asking about an appointment we were going to make for her left hip pain. Please advise.//kn

## 2015-08-11 NOTE — Telephone Encounter (Signed)
Robin Barton has tried to call her several times and her voicemail is full.

## 2015-08-14 ENCOUNTER — Encounter: Payer: Self-pay | Admitting: Obstetrics & Gynecology

## 2015-08-14 NOTE — Telephone Encounter (Signed)
Spoke with patient-states is available to talk with [facility]. They will discuss ortho referral.//kn

## 2015-08-14 NOTE — Telephone Encounter (Signed)
Attempting to contact patient to schedule appointment with orthopaedic office by referral. Raliegh Ip and our referrals made several unsuccessful attempts. Per Dr Sabra Heck, letter sent to notify patient of attempts and information to schedule appointment. Letter in chart.

## 2015-08-14 NOTE — Telephone Encounter (Signed)
Spoke with patient. Gave information for [employer]. Patient will call to schedule. Letter not mailed.  Ok to close.

## 2015-08-15 ENCOUNTER — Other Ambulatory Visit: Payer: Self-pay

## 2015-08-15 MED ORDER — PAROXETINE HCL 20 MG PO TABS: 20.0000 mg | ORAL_TABLET | ORAL | Status: DC

## 2015-08-15 NOTE — Telephone Encounter (Signed)
RF for prescription completed.  Encounter closed.

## 2015-08-15 NOTE — Telephone Encounter (Signed)
Medication refill request: Paroxetine HCL 20 g Last AEX:  08/10/2015 SM Next AEX: 08/20/2016 Last MMG (if hormonal medication request): 12/15/2014 BiRADS 1 Refill authorized: Paroxetine # 30 tabs 13 Refills 10/14/ 2015 Today: #30 tabs 13 Refills ?

## 2015-11-24 ENCOUNTER — Encounter: Payer: Self-pay | Admitting: Gastroenterology

## 2015-11-30 ENCOUNTER — Other Ambulatory Visit: Payer: Self-pay | Admitting: *Deleted

## 2015-11-30 DIAGNOSIS — D0502 Lobular carcinoma in situ of left breast: Secondary | ICD-10-CM

## 2015-11-30 MED ORDER — EXEMESTANE 25 MG PO TABS: 25.0000 mg | ORAL_TABLET | Freq: Every day | ORAL | Status: DC

## 2015-12-04 ENCOUNTER — Encounter: Payer: Self-pay | Admitting: Gastroenterology

## 2015-12-18 ENCOUNTER — Ambulatory Visit (AMBULATORY_SURGERY_CENTER): Payer: Self-pay | Admitting: *Deleted

## 2015-12-18 VITALS — Ht 62.0 in | Wt 118.6 lb

## 2015-12-18 DIAGNOSIS — Z800 Family history of malignant neoplasm of digestive organs: Secondary | ICD-10-CM

## 2015-12-18 MED ORDER — NA SULFATE-K SULFATE-MG SULF 17.5-3.13-1.6 GM/177ML PO SOLN: 1.0000 | Freq: Once | ORAL | Status: DC

## 2015-12-18 NOTE — Progress Notes (Signed)
No egg or soy allergy known to patient  No issues with past sedation with any surgeries  or procedures, no intubation problems  No diet pills per patient No home 02 use per patient  No blood thinners per patient  Pt denies issues with constipation   

## 2016-01-08 ENCOUNTER — Other Ambulatory Visit: Payer: Self-pay | Admitting: Internal Medicine

## 2016-01-08 DIAGNOSIS — E041 Nontoxic single thyroid nodule: Secondary | ICD-10-CM

## 2016-01-17 ENCOUNTER — Telehealth: Payer: Self-pay

## 2016-01-17 NOTE — Telephone Encounter (Addendum)
-----   Message from Nunzio Cobbs, MD sent at 01/15/2016  8:13 PM EDT ----- Regarding: RE: BMD at [facility],   This is Dr. Quincy Simmonds reviewing Dr. Ammie Ferrier in basket.   Please let the patient know that the BMD is covered every 2 years and not yearly by her plan.  Dr. Sabra Heck wishes to do her BMD in another year due to this.   Thank you.   Robin Barton ----- Message -----    From: Jasmine Awe, RN    Sent: 01/15/2016  11:35 AM      To: Megan Salon, MD Subject: RE: BMD at [place],  I spoke with Eddie Dibbles at [facility] who states that under the patient's current plan a BMD is only covered at 100 % every 2 years. If she were to have this done early it would be subject to coinsurance and her deductible. Is there anything else I can do for this patient?   ----- Message -----    From: Megan Salon, MD    Sent: 01/13/2016   7:51 AM      To: Jasmine Awe, RN Subject: BMD at [facility]                                   Pt and I discussed repeating her BMD in one year since the last one (1 year ago) was done at [facility] and was really different than the one in 2015 at [facility].  Can you see if her insurance will cover this.  If yes, please order it and let the pt know it needs to be scheduled.  If not covered, I will do it in another year.  Ok to let me know about this via staff message or phone note.  Thanks.  MSM

## 2016-01-17 NOTE — Telephone Encounter (Signed)
Notified patient the BMD is covered every 2 years and not yearly under her insurance plan. Dr.Miller would like to repeat her BMD in another year so that insurance will provider coverage for the imaging. She is agreeable and verbalizes understanding.  Cc: Dr.Miller  Routing to provider for final review. Patient agreeable to disposition. Will close encounter.

## 2016-01-29 ENCOUNTER — Encounter: Payer: BLUE CROSS/BLUE SHIELD | Admitting: Gastroenterology

## 2016-01-30 ENCOUNTER — Ambulatory Visit (HOSPITAL_BASED_OUTPATIENT_CLINIC_OR_DEPARTMENT_OTHER): Payer: BLUE CROSS/BLUE SHIELD | Admitting: Hematology and Oncology

## 2016-01-30 ENCOUNTER — Encounter: Payer: Self-pay | Admitting: Hematology and Oncology

## 2016-01-30 ENCOUNTER — Telehealth: Payer: Self-pay | Admitting: Hematology and Oncology

## 2016-01-30 VITALS — BP 157/74 | HR 50 | Temp 97.8°F | Resp 18 | Ht 62.0 in | Wt 117.0 lb

## 2016-01-30 DIAGNOSIS — D0502 Lobular carcinoma in situ of left breast: Secondary | ICD-10-CM

## 2016-01-30 DIAGNOSIS — M859 Disorder of bone density and structure, unspecified: Secondary | ICD-10-CM

## 2016-01-30 DIAGNOSIS — M8580 Other specified disorders of bone density and structure, unspecified site: Secondary | ICD-10-CM

## 2016-01-30 MED ORDER — EXEMESTANE 25 MG PO TABS: 25.0000 mg | ORAL_TABLET | Freq: Every day | ORAL | Status: DC

## 2016-01-30 NOTE — Assessment & Plan Note (Signed)
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 Dalbert Batman, exemestane started April 2015  Exemestane toxicities:  1. Occasional hot flashes 2. Mild joint aches  Breast Cancer Surveillance: 1. Breast exam 01/30/2016: Normal 2. Mammogram 12/18/2015 No abnormalities. Postsurgical changes. Breast Density Category B. I recommended that she get 3-D mammograms for surveillance. Discussed the differences between different breast density categories.  Return to clinic in one year

## 2016-01-30 NOTE — Progress Notes (Signed)
Patient Care Team: Velna Hatchet, MD as PCP - General (Internal Medicine)  DIAGNOSIS: LCIS. biopsy done on 11/25/2013 followed by lumpectomy on 12/10/2013 by Dr. Dalbert Batman.  CHIEF COMPLIANT: Followup on exemestane since April 2015  INTERVAL HISTORY: Robin Barton is a 60 year old with above-mentioned history of LCIS currently on exemestane since April 2015 and she appears to be tolerating it very well. Occasional hot flashes and myalgias. Mammogram done 12/18/2015 were normal. Bone density 01/03/2015 showed a T score of -2.1 Her major complaint is related to waking up at night with hot flashes.  REVIEW OF SYSTEMS:   Constitutional: Denies fevers, chills or abnormal weight loss Eyes: Denies blurriness of vision Ears, nose, mouth, throat, and face: Denies mucositis or sore throat Respiratory: Denies cough, dyspnea or wheezes Cardiovascular: Denies palpitation, chest discomfort Gastrointestinal:  Denies nausea, heartburn or change in bowel habits Skin: Denies abnormal skin rashes Lymphatics: Denies new lymphadenopathy or easy bruising Neurological:Denies numbness, tingling or new weaknesses Behavioral/Psych: Mood is stable, no new changes  Extremities: No lower extremity edema Breast:  denies any pain or lumps or nodules in either breasts All other systems were reviewed with the patient and are negative.  I have reviewed the past medical history, past surgical history, social history and family history with the patient and they are unchanged from previous note.  ALLERGIES:  is allergic to sulfonamide derivatives.  MEDICATIONS:  Current Outpatient Prescriptions  Medication Sig Dispense Refill  . alendronate (FOSAMAX) 70 MG tablet Reported on 12/18/2015  0  . atenolol (TENORMIN) 25 MG tablet Take 1 tablet (25 mg total) by mouth daily. 30 tablet 2  . bisacodyl (DULCOLAX) 5 MG EC tablet Take 5 mg by mouth once. X 4--for colon 5-8    . calcium carbonate (OS-CAL - DOSED IN MG OF  ELEMENTAL CALCIUM) 1250 MG tablet Take 1 tablet by mouth daily.      . Cholecalciferol (VITAMIN D) 1000 UNITS capsule Take 1,000 Units by mouth daily.      . diclofenac (VOLTAREN) 50 MG EC tablet Take 50 mg by mouth as needed. Uses as needed- maybe 4 times a week    . exemestane (AROMASIN) 25 MG tablet Take 1 tablet (25 mg total) by mouth daily after breakfast. 90 tablet 3  . ibuprofen (ADVIL,MOTRIN) 800 MG tablet Take 1 tablet (800 mg total) by mouth every 8 (eight) hours as needed. 30 tablet 1  . lisinopril (PRINIVIL,ZESTRIL) 40 MG tablet Take 1 tablet (40 mg total) by mouth daily. 30 tablet 2  . PARoxetine (PAXIL) 20 MG tablet Take 1 tablet (20 mg total) by mouth every morning. 30 tablet 13  . polyethylene glycol powder (MIRALAX) powder Take 1 Container by mouth once. For colon 5-8    . simvastatin (ZOCOR) 40 MG tablet Take 40 mg by mouth every evening.    . traZODone (DESYREL) 50 MG tablet Take 1 tablet (50 mg total) by mouth at bedtime. 30 tablet PRN   No current facility-administered medications for this visit.    PHYSICAL EXAMINATION: ECOG PERFORMANCE STATUS: 0 - Asymptomatic  Filed Vitals:   01/30/16 1002  BP: 157/74  Pulse: 50  Temp: 97.8 F (36.6 C)  Resp: 18   Filed Weights   01/30/16 1002  Weight: 117 lb (53.071 kg)    GENERAL:alert, no distress and comfortable SKIN: skin color, texture, turgor are normal, no rashes or significant lesions EYES: normal, Conjunctiva are pink and non-injected, sclera clear OROPHARYNX:no exudate, no erythema and lips, buccal mucosa, and  tongue normal  NECK: supple, thyroid normal size, non-tender, without nodularity LYMPH:  no palpable lymphadenopathy in the cervical, axillary or inguinal LUNGS: clear to auscultation and percussion with normal breathing effort HEART: regular rate & rhythm and no murmurs and no lower extremity edema ABDOMEN:abdomen soft, non-tender and normal bowel sounds MUSCULOSKELETAL:no cyanosis of digits and no  clubbing  NEURO: alert & oriented x 3 with fluent speech, no focal motor/sensory deficits EXTREMITIES: No lower extremity edema BREAST: No palpable masses or nodules in either right or left breasts. No palpable axillary supraclavicular or infraclavicular adenopathy no breast tenderness or nipple discharge. (exam performed in the presence of a chaperone)  LABORATORY DATA:  I have reviewed the data as listed   Chemistry      Component Value Date/Time   NA 139 07/19/2014 0903   NA 140 12/08/2013 1120   K 4.7 07/19/2014 0903   K 4.7 12/08/2013 1120   CL 102 12/08/2013 1120   CO2 24 07/19/2014 0903   CO2 27 12/08/2013 1120   BUN 22.8 07/19/2014 0903   BUN 17 12/08/2013 1120   CREATININE 0.8 07/19/2014 0903   CREATININE 0.67 12/08/2013 1120   CREATININE 0.84 04/05/2011 0923      Component Value Date/Time   CALCIUM 9.6 07/19/2014 0903   CALCIUM 9.8 12/08/2013 1120   ALKPHOS 54 07/19/2014 0903   ALKPHOS 46 10/08/2011 0937   AST 23 07/19/2014 0903   AST 28 10/08/2011 0937   ALT 21 07/19/2014 0903   ALT 26 10/08/2011 0937   BILITOT 0.49 07/19/2014 0903   BILITOT 0.4 10/08/2011 0937       Lab Results  Component Value Date   WBC 5.0 07/19/2014   HGB 12.5 07/19/2014   HCT 37.8 07/19/2014   MCV 96.0 07/19/2014   PLT 370 07/19/2014   NEUTROABS 2.7 07/19/2014   ASSESSMENT & PLAN:  Neoplasm of left breast, 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 Dalbert Batman, exemestane started April 2015  Exemestane toxicities:  1. Occasional hot flashes especially at night: Encourage her to take exemestane at bedtime 2. Mild joint aches 3. Osteopenia T score -2.1: I encouraged the patient to take oral bisphosphonate therapy along with calcium and vitamin D as recommended by her primary care physician. I also encouraged her to continue weightbearing exercises.  Breast Cancer  Surveillance: 1. Breast exam 01/30/2016: Normal 2. Mammogram 12/18/2015 No abnormalities. Postsurgical changes. Breast Density Category B. I recommended that she get 3-D mammograms for surveillance. Discussed the differences between different breast density categories.  Return to clinic in one year   No orders of the defined types were placed in this encounter.   The patient has a good understanding of the overall plan. she agrees with it. she will call with any problems that may develop before the next visit here.   Rulon Eisenmenger, MD 01/30/2016

## 2016-01-30 NOTE — Telephone Encounter (Signed)
appt made and avs printed °

## 2016-01-31 ENCOUNTER — Other Ambulatory Visit: Payer: BLUE CROSS/BLUE SHIELD

## 2016-02-01 ENCOUNTER — Ambulatory Visit
Admission: RE | Admit: 2016-02-01 | Discharge: 2016-02-01 | Disposition: A | Discharge status: Home / Self Care | Payer: BLUE CROSS/BLUE SHIELD | Source: Ambulatory Visit | Attending: Internal Medicine | Admitting: Internal Medicine

## 2016-02-01 DIAGNOSIS — E041 Nontoxic single thyroid nodule: Secondary | ICD-10-CM

## 2016-02-06 ENCOUNTER — Ambulatory Visit (AMBULATORY_SURGERY_CENTER): Payer: BLUE CROSS/BLUE SHIELD | Admitting: Gastroenterology

## 2016-02-06 ENCOUNTER — Encounter: Payer: Self-pay | Admitting: Gastroenterology

## 2016-02-06 VITALS — BP 104/53 | HR 46 | Temp 98.4°F | Resp 11 | Ht 62.0 in | Wt 118.0 lb

## 2016-02-06 DIAGNOSIS — Z800 Family history of malignant neoplasm of digestive organs: Secondary | ICD-10-CM | POA: Diagnosis not present

## 2016-02-06 DIAGNOSIS — D123 Benign neoplasm of transverse colon: Secondary | ICD-10-CM

## 2016-02-06 DIAGNOSIS — Z1211 Encounter for screening for malignant neoplasm of colon: Secondary | ICD-10-CM

## 2016-02-06 MED ORDER — SODIUM CHLORIDE 0.9 % IV SOLN
500.0000 mL | INTRAVENOUS | Status: DC
Start: 2016-02-06 — End: 2016-02-06

## 2016-02-06 NOTE — Progress Notes (Signed)
Report to PACU, RN, vss, BBS= Clear.  

## 2016-02-06 NOTE — Op Note (Signed)
[place] Patient Name: Robin Barton Procedure Date: 02/06/2016 7:34 AM MRN: [id number] Endoscopist: Remo Lipps P. Havery Moros , MD Age: 60 Referring MD:  Date of Birth: 1956/06/15 Gender: Female Procedure:                Colonoscopy Indications:              Screening patient at increased risk: Family history                            of 1st-degree relative with colorectal cancer at                            age 19 years (or older) Medicines:                Monitored Anesthesia Care Procedure:                Pre-Anesthesia Assessment:                           - Prior to the procedure, a History and Physical                            was performed, and patient medications and                            allergies were reviewed. The patient's tolerance of                            previous anesthesia was also reviewed. The risks                            and benefits of the procedure and the sedation                            options and risks were discussed with the patient.                            All questions were answered, and informed consent                            was obtained. Prior Anticoagulants: The patient has                            taken no previous anticoagulant or antiplatelet                            agents. ASA Grade Assessment: II - A patient with                            mild systemic disease. After reviewing the risks                            and benefits, the patient was deemed in  satisfactory condition to undergo the procedure.                           After obtaining informed consent, the colonoscope                            was passed under direct vision. Throughout the                            procedure, the patient's blood pressure, pulse, and                            oxygen saturations were monitored continuously. The                            Model CF-HQ190L [id number]) scope was  introduced                            through the anus and advanced to the the cecum,                            identified by appendiceal orifice and ileocecal                            valve. The colonoscopy was performed without                            difficulty. The patient tolerated the procedure                            well. The quality of the bowel preparation was                            good. The ileocecal valve, appendiceal orifice, and                            rectum were photographed. Scope In: 8:04:42 AM Scope Out: 8:26:30 AM Scope Withdrawal Time: 0 hours 18 minutes 12 seconds  Total Procedure Duration: 0 hours 21 minutes 48 seconds  Findings:                 The perianal and digital rectal examinations were                            normal.                           A 7 mm polyp was found in the hepatic flexure. The                            polyp was sessile. The polyp was removed with a                            cold snare. Resection and retrieval were complete.  A 3 mm polyp was found in the transverse colon. The                            polyp was sessile. The polyp was removed with a                            cold snare. Resection and retrieval were complete.                           A 5 mm polyp was found in the splenic flexure. The                            polyp was sessile. The polyp was removed with a                            cold snare. Resection and retrieval were complete.                           The colon (entire examined portion) was tortuous.                           The exam was otherwise without abnormality. Complications:            No immediate complications. Estimated blood loss:                            Minimal. Estimated Blood Loss:     Estimated blood loss was minimal. Impression:               - One 7 mm polyp at the hepatic flexure, removed                            with a cold snare. Resected  and retrieved.                           - One 3 mm polyp in the transverse colon, removed                            with a cold snare. Resected and retrieved.                           - One 5 mm polyp at the splenic flexure, removed                            with a cold snare. Resected and retrieved.                           - Tortuous colon.                           - The examination was otherwise normal. Recommendation:           - Patient has a contact number available for  emergencies. The signs and symptoms of potential                            delayed complications were discussed with the                            patient. Return to normal activities tomorrow.                            Written discharge instructions were provided to the                            patient.                           - Resume previous diet.                           - Continue present medications.                           - No aspirin, ibuprofen, naproxen, or other                            non-steroidal anti-inflammatory drugs for 2 weeks                            after polyp removal.                           - Await pathology results.                           - Repeat colonoscopy is recommended for                            surveillance. The colonoscopy date will be                            determined after pathology results from today's                            exam become available for review. Remo Lipps P. , MD 02/06/2016 8:30:02 AM This report has been signed electronically.

## 2016-02-06 NOTE — Patient Instructions (Signed)
YOU HAD AN ENDOSCOPIC PROCEDURE TODAY AT THE [facility]:   Refer to the procedure report that was given to you for any specific questions about what was found during the examination.  If the procedure report does not answer your questions, please call your gastroenterologist to clarify.  If you requested that your care partner not be given the details of your procedure findings, then the procedure report has been included in a sealed envelope for you to review at your convenience later.  YOU SHOULD EXPECT: Some feelings of bloating in the abdomen. Passage of more gas than usual.  Walking can help get rid of the air that was put into your GI tract during the procedure and reduce the bloating. If you had a lower endoscopy (such as a colonoscopy or flexible sigmoidoscopy) you may notice spotting of blood in your stool or on the toilet paper. If you underwent a bowel prep for your procedure, you may not have a normal bowel movement for a few days.  Please Note:  You might notice some irritation and congestion in your nose or some drainage.  This is from the oxygen used during your procedure.  There is no need for concern and it should clear up in a day or so.  SYMPTOMS TO REPORT IMMEDIATELY:   Following lower endoscopy (colonoscopy or flexible sigmoidoscopy):  Excessive amounts of blood in the stool  Significant tenderness or worsening of abdominal pains  Swelling of the abdomen that is new, acute  Fever of 100F or higher   For urgent or emergent issues, a gastroenterologist can be reached at any hour by calling [phone].   DIET: Your first meal following the procedure should be a small meal and then it is ok to progress to your normal diet. Heavy or fried foods are harder to digest and may make you feel nauseous or bloated.  Likewise, meals heavy in dairy and vegetables can increase bloating.  Drink plenty of fluids but you should avoid alcoholic beverages for 24  hours.  ACTIVITY:  You should plan to take it easy for the rest of today and you should NOT DRIVE or use heavy machinery until tomorrow (because of the sedation medicines used during the test).    FOLLOW UP: Our staff will call the number listed on your records the next business day following your procedure to check on you and address any questions or concerns that you may have regarding the information given to you following your procedure. If we do not reach you, we will leave a message.  However, if you are feeling well and you are not experiencing any problems, there is no need to return our call.  We will assume that you have returned to your regular daily activities without incident.  If any biopsies were taken you will be contacted by phone or by letter within the next 1-3 weeks.  Please call us at [phone] if you have not heard about the biopsies in 3 weeks.    SIGNATURES/CONFIDENTIALITY: You and/or your care partner have signed paperwork which will be entered into your electronic medical record.  These signatures attest to the fact that that the information above on your After Visit Summary has been reviewed and is understood.  Full responsibility of the confidentiality of this discharge information lies with you and/or your care-partner.  Polyps-handout given  Repeat colonoscopy will be determined by pathology.  

## 2016-02-06 NOTE — Progress Notes (Signed)
Called to room to assist during endoscopic procedure.  Patient ID and intended procedure confirmed with present staff. Received instructions for my participation in the procedure from the performing physician.  

## 2016-02-07 ENCOUNTER — Telehealth: Payer: Self-pay | Admitting: *Deleted

## 2016-02-07 NOTE — Telephone Encounter (Signed)
No answer, message left for the patient. 

## 2016-02-09 ENCOUNTER — Encounter: Payer: Self-pay | Admitting: Gastroenterology

## 2016-02-09 ENCOUNTER — Other Ambulatory Visit: Payer: Self-pay | Admitting: Internal Medicine

## 2016-02-09 DIAGNOSIS — E041 Nontoxic single thyroid nodule: Secondary | ICD-10-CM

## 2016-02-13 ENCOUNTER — Ambulatory Visit
Admission: RE | Admit: 2016-02-13 | Discharge: 2016-02-13 | Disposition: A | Discharge status: Home / Self Care | Payer: BLUE CROSS/BLUE SHIELD | Source: Ambulatory Visit | Attending: Internal Medicine | Admitting: Internal Medicine

## 2016-02-13 ENCOUNTER — Other Ambulatory Visit (HOSPITAL_COMMUNITY)
Admission: RE | Admit: 2016-02-13 | Discharge: 2016-02-13 | Disposition: A | Discharge status: Home / Self Care | Payer: BLUE CROSS/BLUE SHIELD | Source: Ambulatory Visit | Attending: Radiology | Admitting: Radiology

## 2016-02-13 DIAGNOSIS — E041 Nontoxic single thyroid nodule: Secondary | ICD-10-CM | POA: Diagnosis not present

## 2016-05-07 ENCOUNTER — Ambulatory Visit (INDEPENDENT_AMBULATORY_CARE_PROVIDER_SITE_OTHER): Payer: BLUE CROSS/BLUE SHIELD | Admitting: Neurology

## 2016-05-07 ENCOUNTER — Encounter: Payer: Self-pay | Admitting: Neurology

## 2016-05-07 VITALS — BP 100/70 | HR 52 | Ht 62.0 in | Wt 115.0 lb

## 2016-05-07 DIAGNOSIS — R251 Tremor, unspecified: Secondary | ICD-10-CM

## 2016-05-07 NOTE — Progress Notes (Signed)
Robin Barton was seen today in the movement disorders clinic for neurologic consultation at the request of HOLWERDA, Nicki Reaper, MD.  The consultation is for the evaluation of tremor.  I have reviewed records from her primary care physician.  The patient initially thought that the symptoms of tremor were from her Paxil, but now she does not.  Her primary care physician still thinks that the symptoms are likely secondary to anxiety, but the consultation is to make sure that there is not a secondary issue for tremor.  Pt reports that tremor started in April.  It is in both hands, L more than R.  She is R hand dominant.  She notes it mostly in the AM.  She never notes it completely at rest.  She has to have something in the hand for her to note the tremor (this AM, she had the phone in the hand).  Not interfering with ADL's.  Nothing makes better or worse.   Specific Symptoms:  Tremor: Yes.     Affected by caffeine:  No. (2.5 cups coffee/day; one soda per day)  Affected by alcohol:  No. (2-3 glasses of wine/night)  Affected by stress:  Yes.     Affected by fatigue:  No.  Spills soup if on spoon:  No.  Spills glass of liquid if full:  No.  Affects ADL's (tying shoes, brushing teeth, etc):  Will note tremor only when putting on eye makeup  Family hx of similar:  No.   Medication changes:  Increased paxil but had cut back with no change in tremor Voice: no change Sleep: not sleeping as well but cut back on trazodone from 50 mg to 25 mg  Vivid Dreams:  No.  Acting out dreams:  No. Wet Pillows: No. Postural symptoms:  No.  Falls?  No. Bradykinesia symptoms: no bradykinesia noted Loss of smell:  No. Loss of taste:  No. Urinary Incontinence:  No. Difficulty Swallowing:  No. Memory changes:  No. N/V:  No. Lightheaded:  No.  Syncope: No. Diplopia:  No. Dyskinesia:  No.  Neuroimaging has not previously been performed in the recent years.  It was done in 2006. It was normal  ALLERGIES:    Allergies  Allergen Reactions  . Sulfonamide Derivatives Hives, Itching and Rash    CURRENT MEDICATIONS:  Outpatient Encounter Prescriptions as of 05/07/2016  Medication Sig  . alendronate (FOSAMAX) 70 MG tablet Reported on 02/06/2016  . atenolol (TENORMIN) 25 MG tablet Take 1 tablet (25 mg total) by mouth daily.  . calcium carbonate (OS-CAL - DOSED IN MG OF ELEMENTAL CALCIUM) 1250 MG tablet Take 1 tablet by mouth daily.    Marland Kitchen exemestane (AROMASIN) 25 MG tablet Take 1 tablet (25 mg total) by mouth daily after breakfast.  . ibuprofen (ADVIL,MOTRIN) 800 MG tablet Take 1 tablet (800 mg total) by mouth every 8 (eight) hours as needed.  Marland Kitchen lisinopril (PRINIVIL,ZESTRIL) 40 MG tablet Take 1 tablet (40 mg total) by mouth daily.  Marland Kitchen PARoxetine (PAXIL) 20 MG tablet Take 1 tablet (20 mg total) by mouth every morning.  . simvastatin (ZOCOR) 40 MG tablet Take 40 mg by mouth every evening.  . traZODone (DESYREL) 50 MG tablet Take 1 tablet (50 mg total) by mouth at bedtime.  . [DISCONTINUED] Cholecalciferol (VITAMIN D) 1000 UNITS capsule Take 1,000 Units by mouth daily.    . [DISCONTINUED] diclofenac (VOLTAREN) 50 MG EC tablet Take 50 mg by mouth as needed. Uses as needed- maybe 4 times a  week   No facility-administered encounter medications on file as of 05/07/2016.     PAST MEDICAL HISTORY:   Past Medical History:  Diagnosis Date  . Acid reflux   . Atypical lobular hyperplasia of left breast 11/25/13   lumpectomy, 12/10/13  . BCC (basal cell carcinoma)   . Depression   . Hyperlipidemia   . Hypertension   . Osteopenia   . Thyroid disease    rt thyroid nodule  . Wears glasses     PAST SURGICAL HISTORY:   Past Surgical History:  Procedure Laterality Date  . BREAST LUMPECTOMY WITH RADIOACTIVE SEED LOCALIZATION Left 12/10/2013   Procedure: BREAST LUMPECTOMY WITH RADIOACTIVE SEED LOCALIZATION;  Surgeon: [name], MD;  Location: [facility];  Service: General;  Laterality: Left;   . BREAST SURGERY    . BUNIONECTOMY  2/13 left foot and toe straightened  . BUNIONECTOMY  3/13 right foot and toe straightened  . COLONOSCOPY    . NASAL SEPTUM SURGERY  1982    SOCIAL HISTORY:   Social History   Social History  . Marital status: Married    Spouse name: N/A  . Number of children: 2  . Years of education: N/A   Occupational History  . Not on file.   Social History Main Topics  . Smoking status: Current Some Day Smoker    Packs/day: 0.50    Years: 35.00    Types: Cigarettes  . Smokeless tobacco: Never Used  . Alcohol use Yes     Comment: 3 glasses of wine nightly  . Drug use: No  . Sexual activity: Yes    Partners: Male    Birth control/ protection: None   Other Topics Concern  . Not on file   Social History Narrative  . No narrative on file    FAMILY HISTORY:   Family Status  Relation Status  . Mother Deceased  . Father Deceased   drowned  . Sister Alive  . Son Alive  . Daughter Alive  . Neg Hx     ROS:  A complete 10 system review of systems was obtained and was unremarkable apart from what is mentioned above.  PHYSICAL EXAMINATION:    VITALS:   Vitals:   05/07/16 1312  BP: 100/70  Pulse: (!) 52  Weight: 115 lb (52.2 kg)  Height: 5\' 2"  (1.575 m)    GEN:  The patient appears stated age and is in NAD. HEENT:  Normocephalic, atraumatic.  The mucous membranes are moist. The superficial temporal arteries are without ropiness or tenderness. CV:  RRR Lungs:  CTAB Neck/HEME:  There are no carotid bruits bilaterally.  Neurological examination:  Orientation: The patient is alert and oriented x3. Fund of knowledge is appropriate.  Recent and remote memory are intact.  Attention and concentration are normal.    Able to name objects and repeat phrases. Cranial nerves: There is good facial symmetry. R pupil is larger than the left, but neither is reactive. Fundoscopic exam reveals clear margins bilaterally. Extraocular muscles are intact.  The visual fields are full to confrontational testing. The speech is fluent and clear. Soft palate rises symmetrically and there is no tongue deviation. Hearing is intact to conversational tone. Sensation: Sensation is intact to light and pinprick throughout (facial, trunk, extremities). Vibration is intact at the bilateral big toe. There is no extinction with double simultaneous stimulation. There is no sensory dermatomal level identified. Motor: Strength is 5/5 in the bilateral upper and lower extremities.  Shoulder shrug is equal and symmetric.  There is no pronator drift. Deep tendon reflexes: Deep tendon reflexes are 2/4 at the bilateral biceps, triceps, brachioradialis, patella and achilles. Plantar responses are downgoing bilaterally.  Movement examination: Tone: There is normal tone in the bilateral upper extremities.  The tone in the lower extremities is normal.  Abnormal movements: none.  She is able to draw Archimedes spirals with the right and left hand without evidence of tremor.  She is able to pour water from one glass to another without spilling the water.  She has no intention tremor or tremor at rest. Coordination:  There is no decremation with RAM's, with any form of RAMS, including alternating supination and pronation of the forearm, hand opening and closing, finger taps, heel taps and toe taps. Gait and Station: The patient has no difficulty arising out of a deep-seated chair without the use of the hands. The patient's stride length is normal.    ASSESSMENT/PLAN:  1.  Tremor, by history  -I saw no evidence of tremor today, either rest, postural or intention.  She and I talked about various types of tremor, including essential tremor, enhanced physiologic tremor.  We also talked about withdrawal tremor, as she told me that tremor is most significant in the morning, and she does drink about 3 glasses of wine per night.  Regardless, I saw no evidence of any type of neurodegenerative  process.  If the battery done so, I would recommend checking a CMP, B12, TSH and if tremor increases and becomes more persistent, a copper could be evaluated.  At this point in time, I would not recommend any medication for this, especially given the fact that I did not see tremor today.  She agreed.  I told her I would be happy to see her back should her symptoms increase, or should new neurologic issues arise.  Greater than 50% of the 45 minute visit was spent in counseling.

## 2016-06-12 ENCOUNTER — Other Ambulatory Visit: Payer: Self-pay | Admitting: Obstetrics & Gynecology

## 2016-06-13 NOTE — Telephone Encounter (Signed)
Medication refill request: ibuprofen 800mg  Last AEX:  08-03-15 Next AEX: 08-20-16 Last MMG (if hormonal medication request): 12-18-15 category b density birads 2:neg Refill authorized: last given 05-05-15 #30 with 1 refill. Please approve if okay.

## 2016-08-20 ENCOUNTER — Encounter: Payer: Self-pay | Admitting: Obstetrics & Gynecology

## 2016-08-20 ENCOUNTER — Ambulatory Visit (INDEPENDENT_AMBULATORY_CARE_PROVIDER_SITE_OTHER): Payer: BLUE CROSS/BLUE SHIELD | Admitting: Obstetrics & Gynecology

## 2016-08-20 VITALS — BP 108/66 | HR 54 | Resp 14 | Ht 62.0 in | Wt 122.6 lb

## 2016-08-20 DIAGNOSIS — Z124 Encounter for screening for malignant neoplasm of cervix: Secondary | ICD-10-CM

## 2016-08-20 DIAGNOSIS — Z01419 Encounter for gynecological examination (general) (routine) without abnormal findings: Secondary | ICD-10-CM | POA: Diagnosis not present

## 2016-08-20 MED ORDER — IBUPROFEN 800 MG PO TABS: ORAL_TABLET | ORAL | 1 refills | Status: DC

## 2016-08-20 NOTE — Progress Notes (Signed)
60 y.o. [id number] MarriedCaucasianF here for annual exam.  Doing well.  She is on alendronate due to osteopenia noted on BMD done 4/16 at [facility].  Prior one was 9/15 done at [facility].  Needs follow-up in April.    Seeing Dr. Lindi Adie yearly.  On Aromasin.    Reports she is very grateful for appt with Dr. Lynann Bologna.  Hip issues are completely resolved.  Denies vaginal bleeding.    Patient's last menstrual period was 09/24/1999.          Sexually active: Yes.    The current method of family planning is post menopausal status.    Exercising: Yes.    walking Smoker:  yes  Health Maintenance: Pap:  06/15/14 negative  History of abnormal Pap:  no MMG:  12/18/15 BIRADS 2 benign  Colonoscopy:  05/17.  Dr. Havery Moros.  Follow up 3 years. BMD:   01/03/15  TDaP:  05/07/09  Pneumonia vaccine(s):  07/07/15  Zostavax:   never Hep C testing: discuss with provider Screening Labs: PCP, Hb today: PCP, Urine today: PCP   reports that she has been smoking Cigarettes.  She has a 17.50 pack-year smoking history. She has never used smokeless tobacco. She reports that she drinks alcohol. She reports that she does not use drugs.  Past Medical History:  Diagnosis Date  . Acid reflux   . Atypical lobular hyperplasia of left breast 11/25/13   lumpectomy, 12/10/13  . BCC (basal cell carcinoma)   . Depression   . Hyperlipidemia   . Hypertension   . Osteopenia   . Thyroid disease    rt thyroid nodule  . Wears glasses     Past Surgical History:  Procedure Laterality Date  . BREAST LUMPECTOMY WITH RADIOACTIVE SEED LOCALIZATION Left 12/10/2013   Procedure: BREAST LUMPECTOMY WITH RADIOACTIVE SEED LOCALIZATION;  Surgeon: [name], MD;  Location: [facility];  Service: General;  Laterality: Left;  . BREAST SURGERY    . BUNIONECTOMY  2/13 left foot and toe straightened  . BUNIONECTOMY  3/13 right foot and toe straightened  . COLONOSCOPY    . NASAL SEPTUM SURGERY  1982    Current  Outpatient Prescriptions  Medication Sig Dispense Refill  . alendronate (FOSAMAX) 70 MG tablet Reported on 02/06/2016  0  . atenolol (TENORMIN) 25 MG tablet Take 1 tablet (25 mg total) by mouth daily. 30 tablet 2  . calcium carbonate (OS-CAL - DOSED IN MG OF ELEMENTAL CALCIUM) 1250 MG tablet Take 1 tablet by mouth daily.      Marland Kitchen exemestane (AROMASIN) 25 MG tablet Take 1 tablet (25 mg total) by mouth daily after breakfast. 90 tablet 3  . ibuprofen (ADVIL,MOTRIN) 800 MG tablet take 1 tablet by mouth every 8 hours if needed 30 tablet 1  . lisinopril (PRINIVIL,ZESTRIL) 40 MG tablet Take 1 tablet (40 mg total) by mouth daily. 30 tablet 2  . PARoxetine (PAXIL) 20 MG tablet Take 1 tablet (20 mg total) by mouth every morning. 30 tablet 13  . simvastatin (ZOCOR) 40 MG tablet Take 40 mg by mouth every evening.    . traZODone (DESYREL) 50 MG tablet Take 1 tablet (50 mg total) by mouth at bedtime. 30 tablet PRN   No current facility-administered medications for this visit.     Family History  Problem Relation Age of Onset  . Cancer Mother     colon-stage 4  . Hypertension Mother   . Colon cancer Mother   . Breast cancer Sister   .  Colon polyps Neg Hx   . Esophageal cancer Neg Hx   . Rectal cancer Neg Hx   . Stomach cancer Neg Hx     ROS:  Pertinent items are noted in HPI.  Otherwise, a comprehensive ROS was negative.  Exam:   BP 108/66 (BP Location: Right Arm, Patient Position: Sitting, Cuff Size: Normal)   Pulse (!) 54   Resp 14   Ht 5\' 2"  (1.575 m)   Wt 122 lb 9.6 oz (55.6 kg)   LMP 09/24/1999   BMI 22.42 kg/m   Weight change: +1#   Height: 5\' 2"  (157.5 cm)  Ht Readings from Last 3 Encounters:  08/20/16 5\' 2"  (1.575 m)  05/07/16 5\' 2"  (1.575 m)  02/06/16 5\' 2"  (1.575 m)   General appearance: alert, cooperative and appears stated age Head: Normocephalic, without obvious abnormality, atraumatic Neck: no adenopathy, supple, symmetrical, trachea midline and right thyroid nodule  noted Lungs: clear to auscultation bilaterally Breasts: left breast scar, well healed, no new skin changes, no LAD; right breast without masses, nipple discharge, skin changes or LAD Heart: regular rate and rhythm Abdomen: soft, non-tender; bowel sounds normal; no masses,  no organomegaly Extremities: extremities normal, atraumatic, no cyanosis or edema Skin: Skin color, texture, turgor normal. No rashes or lesions Lymph nodes: Cervical, supraclavicular, and axillary nodes normal. No abnormal inguinal nodes palpated Neurologic: Grossly normal   Pelvic: External genitalia:  no lesions              Urethra:  normal appearing urethra with no masses, tenderness or lesions              Bartholins and Skenes: normal                 Vagina: normal appearing vagina with normal color and discharge, no lesions              Cervix: normal appearance, no lesions              Pap taken: Yes.   Bimanual Exam:  Uterus:  normal size, contour, position, consistency, mobility, non-tender              Adnexa: normal adnexa and no mass, fullness, tenderness               Rectovaginal: Confirms               Anus:  normal sphincter tone, no lesions  Chaperone was present for exam.  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 Dalbert Batman.  Now on Aromasin for five years.  Started around 9/15. Osteopenia and on Fosamax Family hx of colon cancer in her mother in early 36's and now pt has hx of adenomatous polyps  P: Mammogram yearly. Did 3D 2/14.  Pap and HR HPV obtained today. Labs/vaccines with Dr. Jackelyn Poling BMD due 4/18.  She will do this at Dr. August Saucer dermatology every six months  Ibuprofen 800mg  po q 8 hrs prn.  #30/1RF return annually or prn

## 2016-08-22 LAB — IPS PAP TEST WITH HPV

## 2017-01-11 IMAGING — US US THYROID
1 series · 14 of 25 positions shown · non-contrast
Comparison: 04/22/2011

CLINICAL DATA: Follow-up nodules

EXAM:
THYROID ULTRASOUND
TECHNIQUE: Ultrasound examination of the thyroid gland and adjacent soft
tissues was performed.

[Series 1: us thyroid · 0.05mm/px · 14 of 70 slices shown]
[im 1/70]
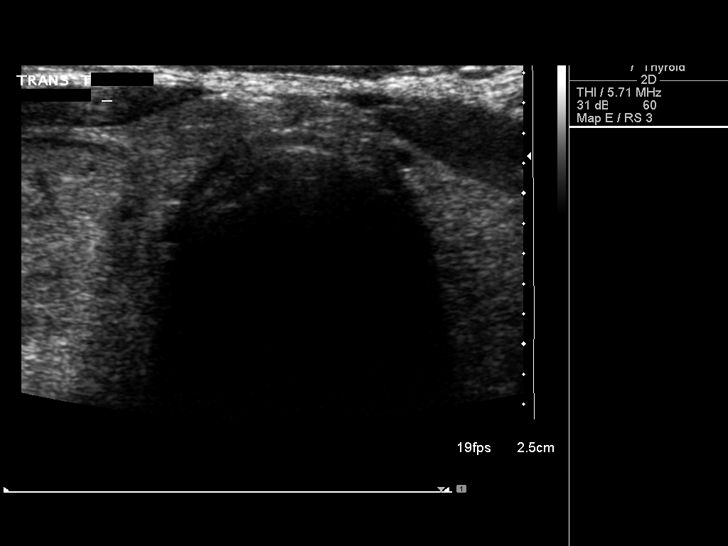
[im 6/70]
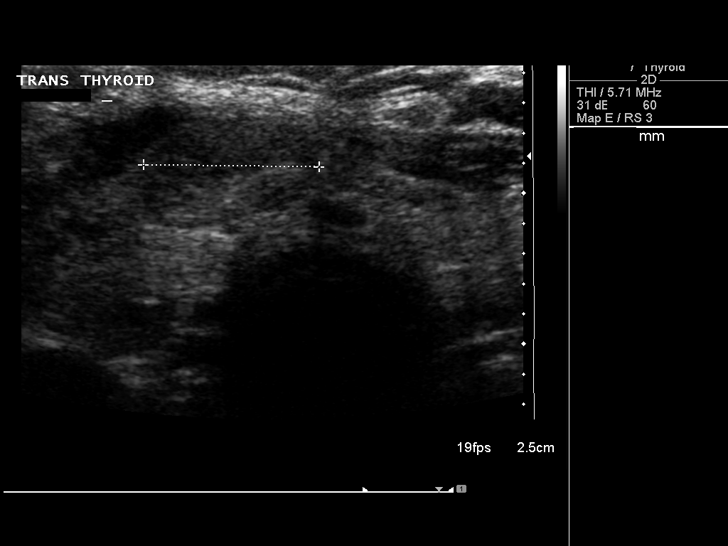
[im 12/70]
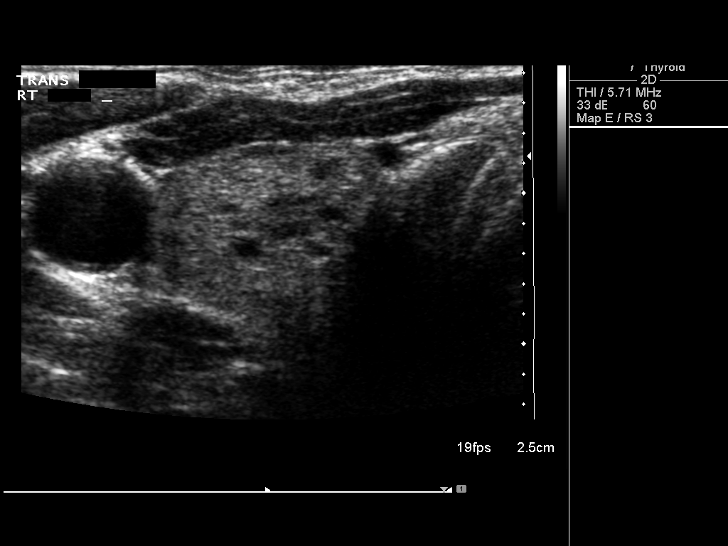
[im 18/70]
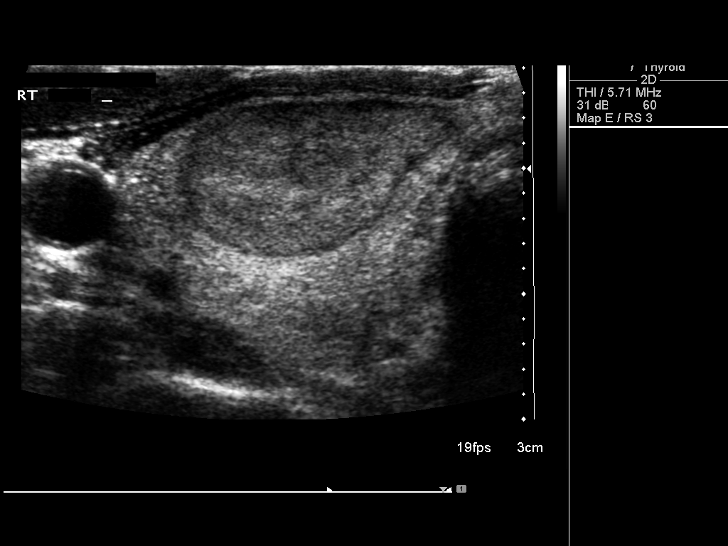
[im 24/70]
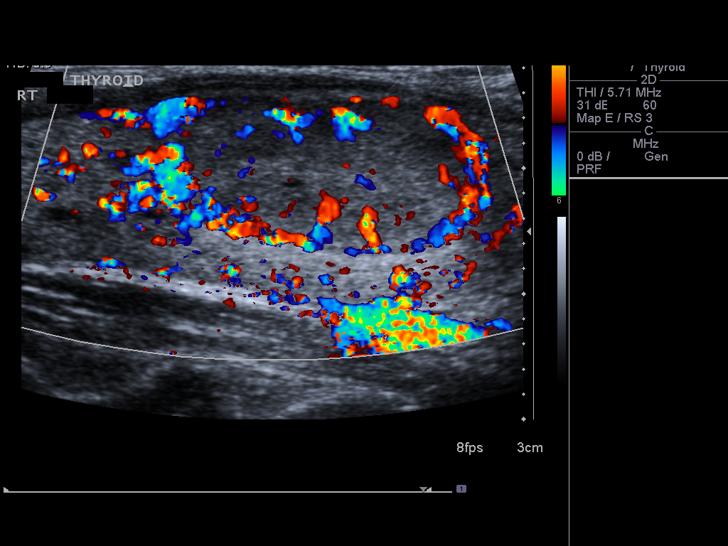
[im 26/70]
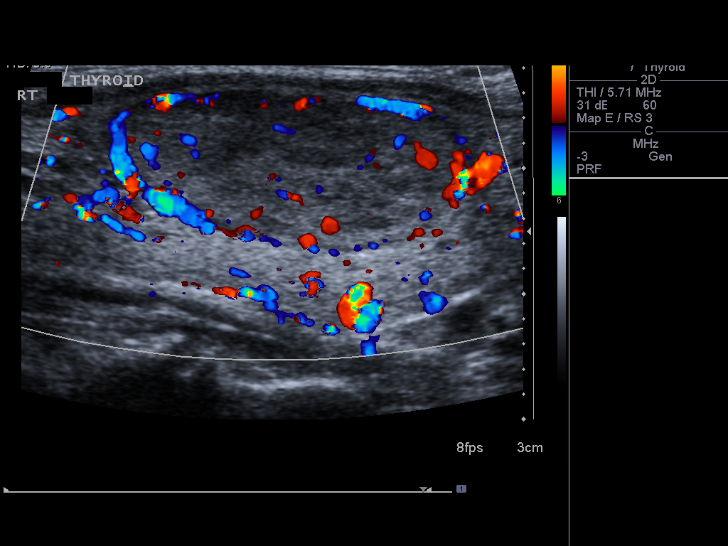
[im 32/70]
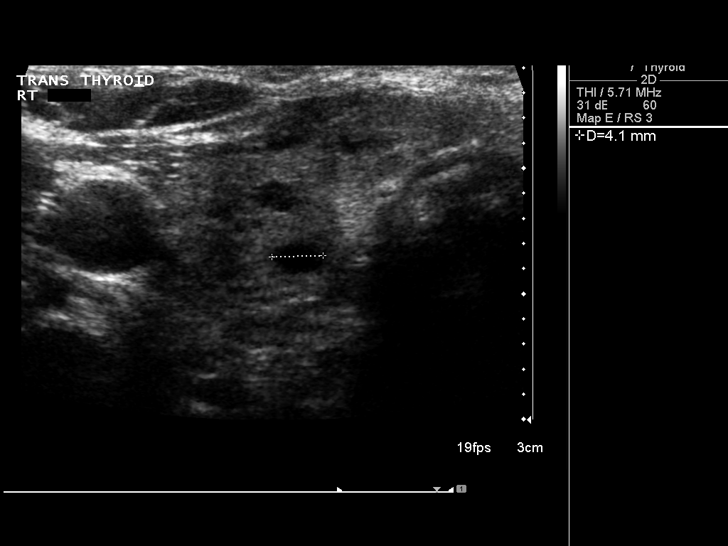
[im 38/70]
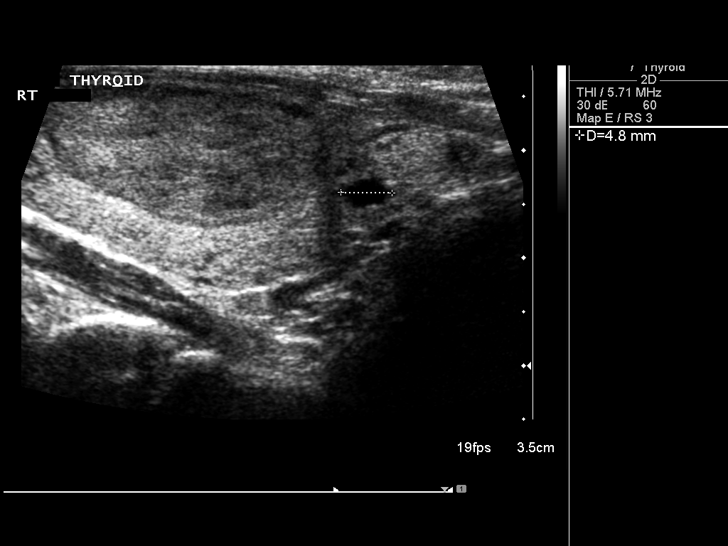
[im 44/70]
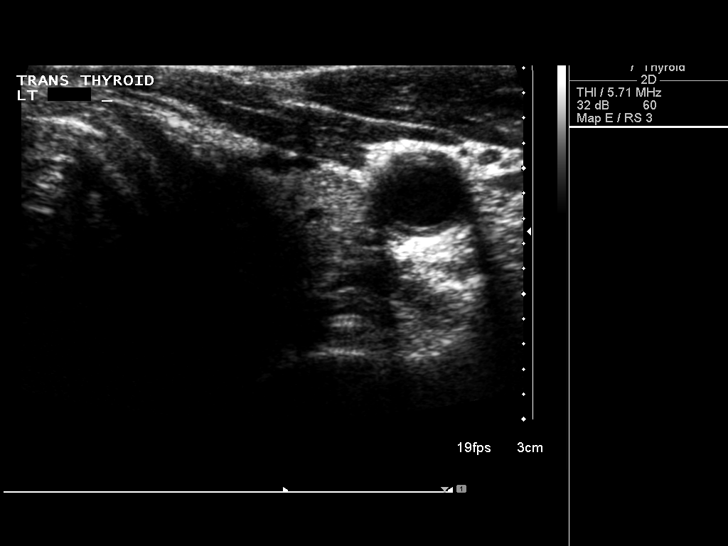
[im 47/70]
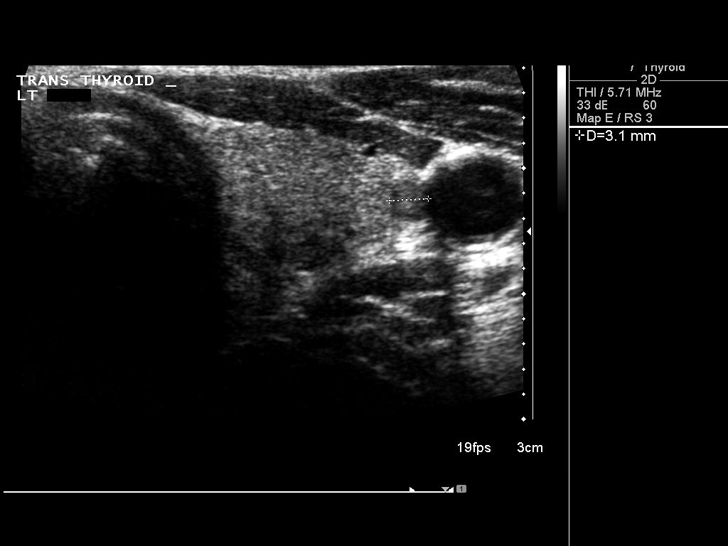
[im 52/70]
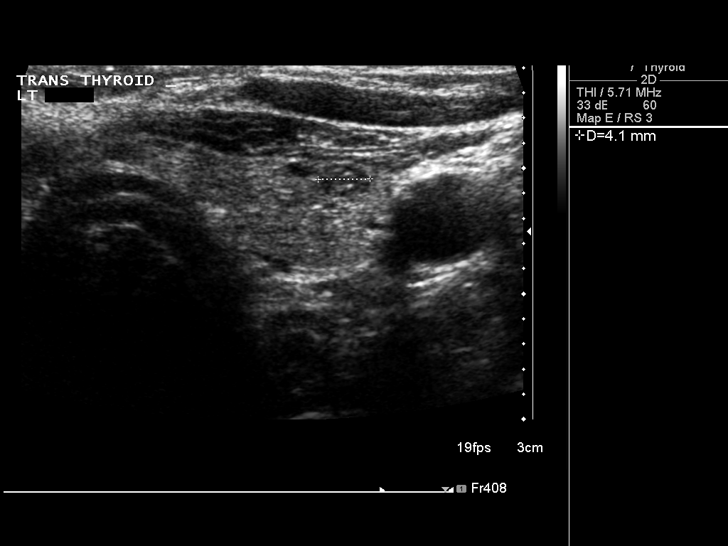
[im 58/70]
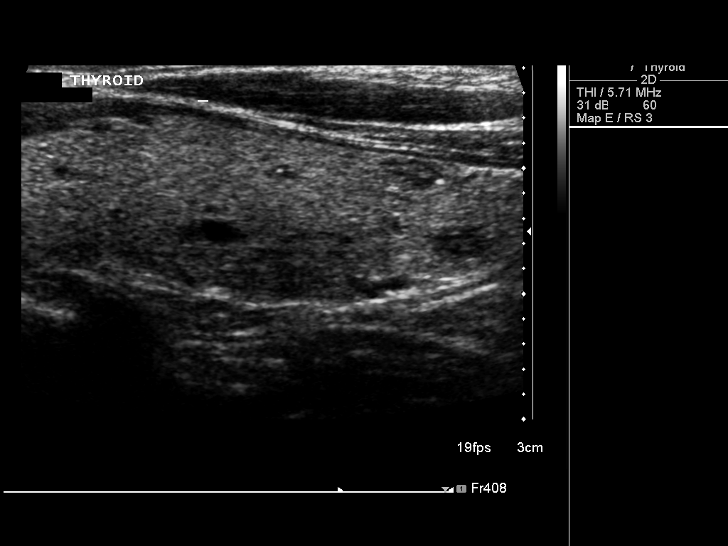
[im 64/70]
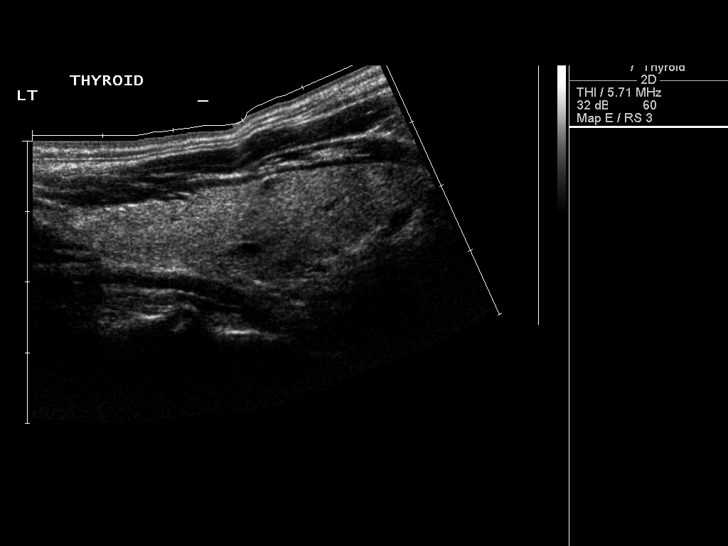
[im 70/70]
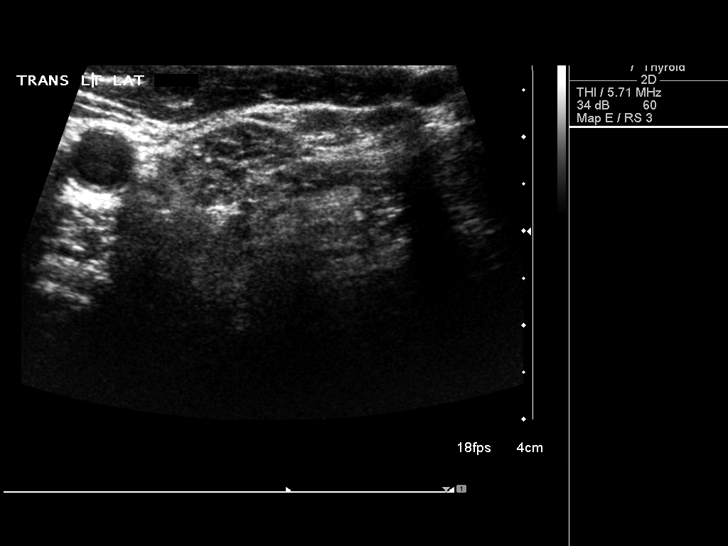

[14 of 25 positions shown; findings below may reference images not displayed]

FINDINGS: Right thyroid lobe

Measurements: 5.6 x 2.1 x 2.5 cm. Dominant right mid lobe nodule
measures 2.9 x 1.3 x 2.1 cm. Previously, this measured up to 1.9 cm.
Adjacent 1.0 cm nodule. Other smaller nodules are scattered
throughout the right lobe.

Left thyroid lobe

Measurements: 5.3 x 1.6 x 1.7 cm. Scattered very small nodules are
seen throughout the left lobe.

Isthmus

Thickness: 4 mm. Right isthmic nodule measures 1.1 x 0.5 x 1.2 cm.
Previously, this measured 1.1 cm.

Lymphadenopathy

None visualized.
IMPRESSION: Dominant right lobe nodule has increased from 1.9 cm to 2.9 cm.
Findings meet consensus criteria for biopsy. Ultrasound-guided fine
needle aspiration should be considered, as per the consensus
statement: Management of Thyroid Nodules Detected at US: Society of
Radiologists in Ultrasound Consensus Conference Statement. Radiology

## 2017-01-23 IMAGING — US US THYROID BIOPSY
1 series · 13 of 13 positions shown · non-contrast
Comparison: Thyroid ultrasound dated 02/01/2016

INDICATION: Patient with history of multinodular goiter and prior biopsy of a
ultrasound on 02/01/2016 shows interval growth of the dominant right
mid thyroid lobe nodule from 1.9 cm to now 2.9 cm. Request made for
needle aspirate biopsy of this dominant right thyroid nodule.

EXAM:
ULTRASOUND GUIDED NEEDLE ASPIRATE BIOPSY OF DOMINANT MID RIGHT
THYROID NODULE

[Series 1: us thyroid biopsy · 0.06mm/px · 13 acquisitions, 13 frames shown]
[im 1/13]
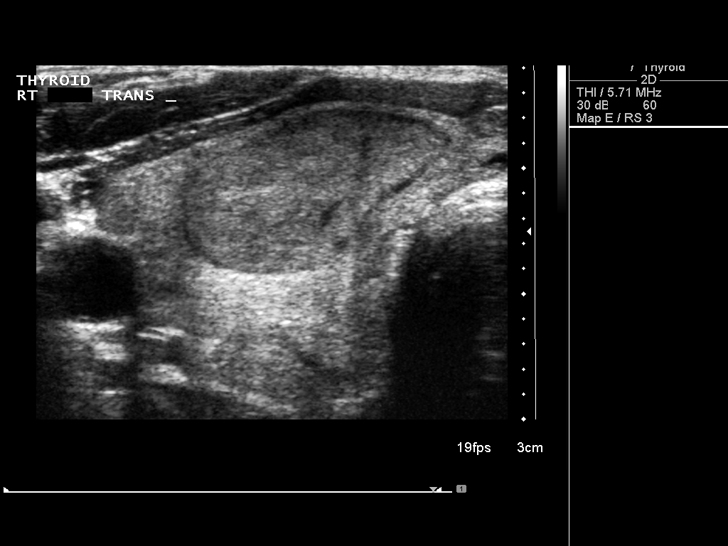
[im 2/13]
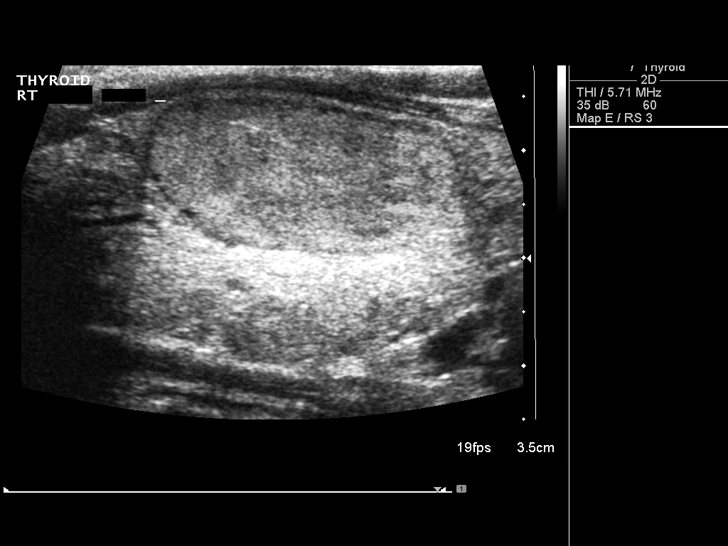
[im 3/13]
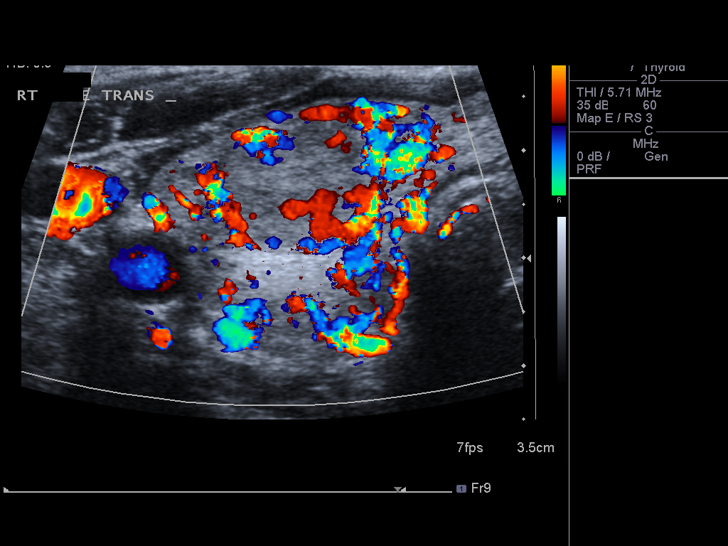
[im 4/13]
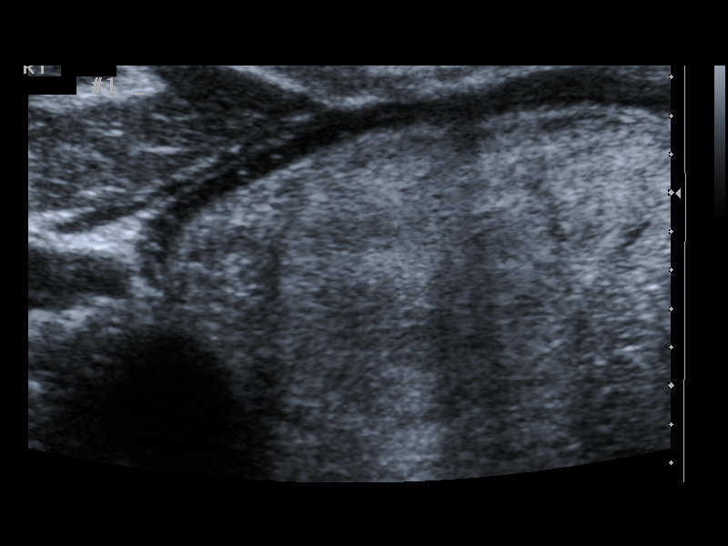
[im 5/13]
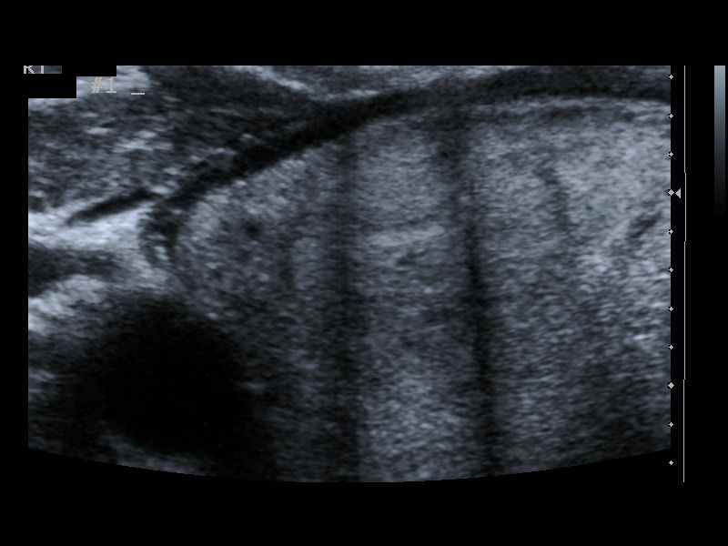
[im 6/13]
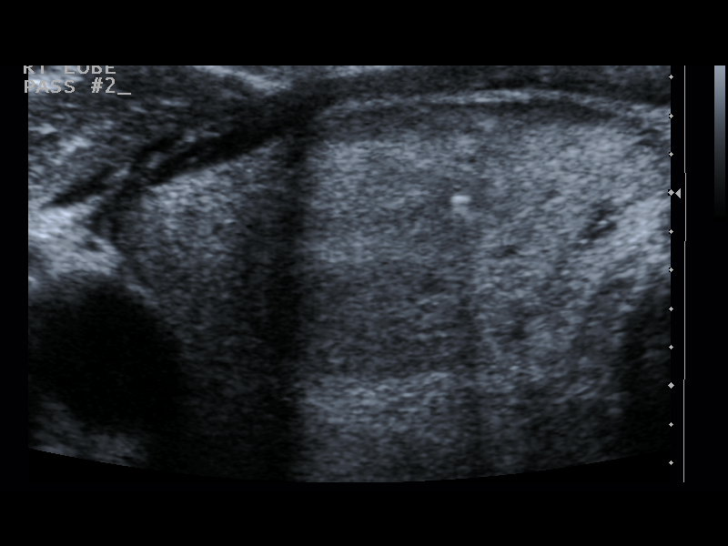
[im 7/13]
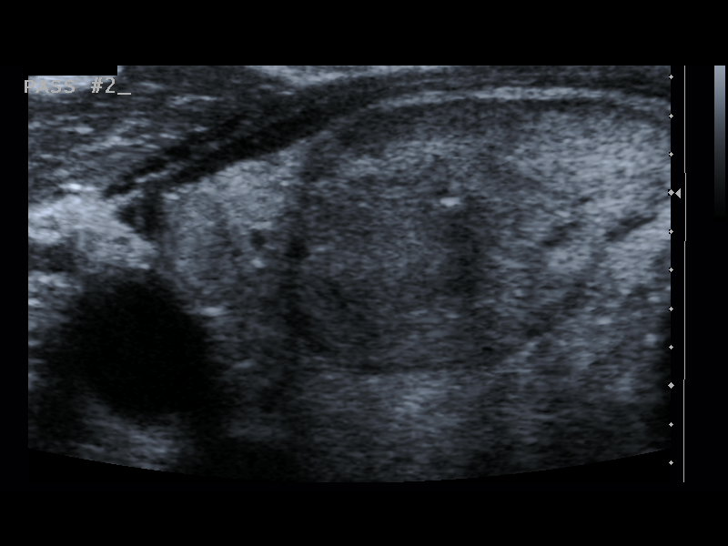
[im 8/13]
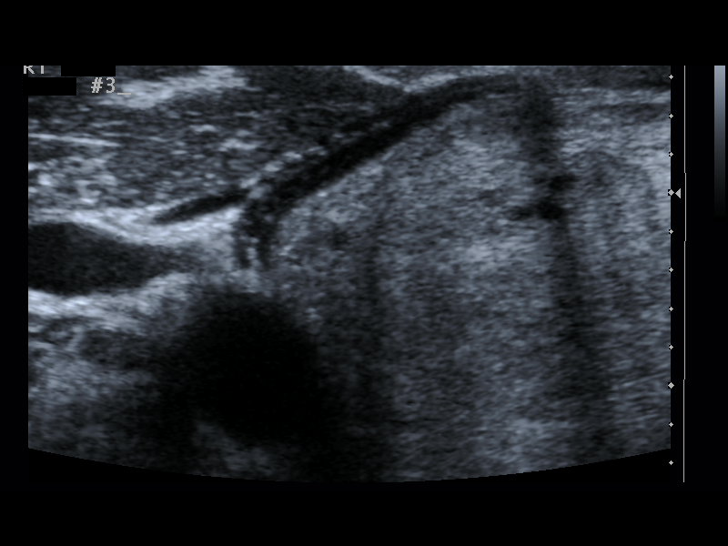
[im 9/13]
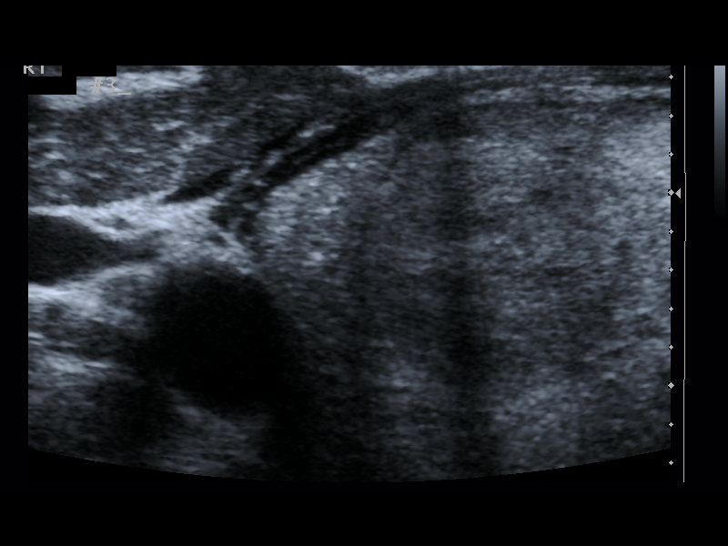
[im 10/13]
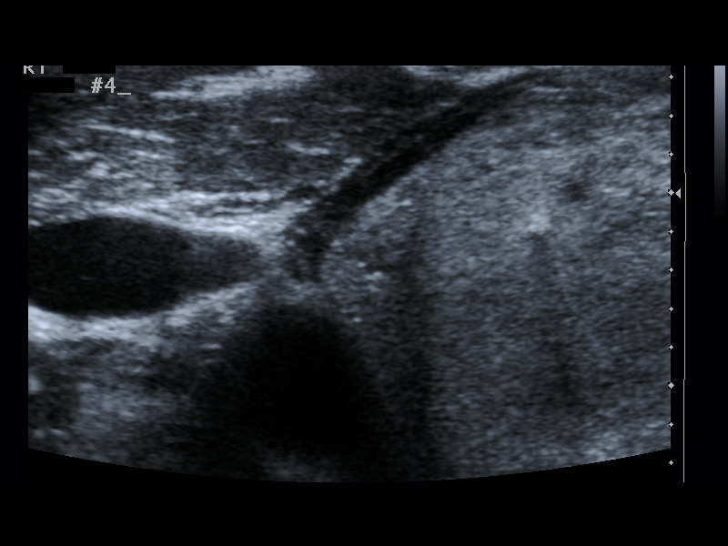
[im 11/13]
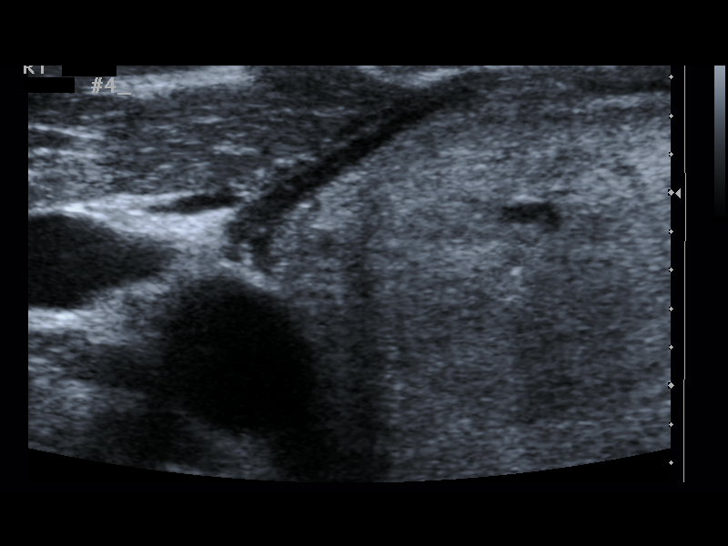
[im 12/13]
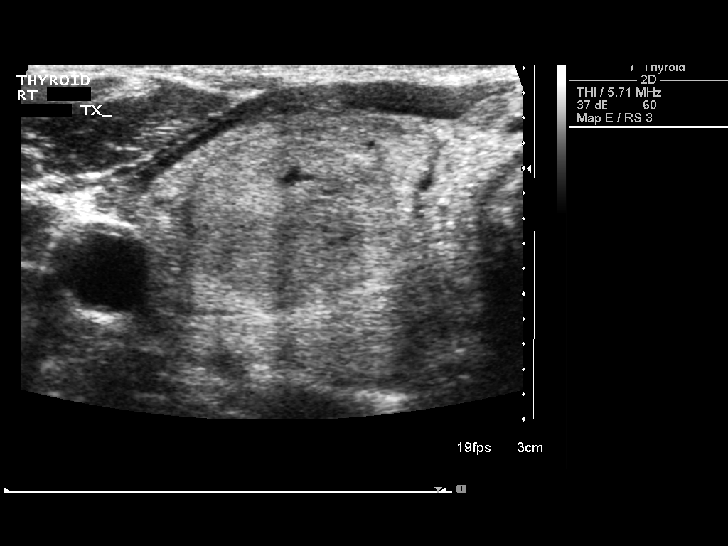
[im 13/13]
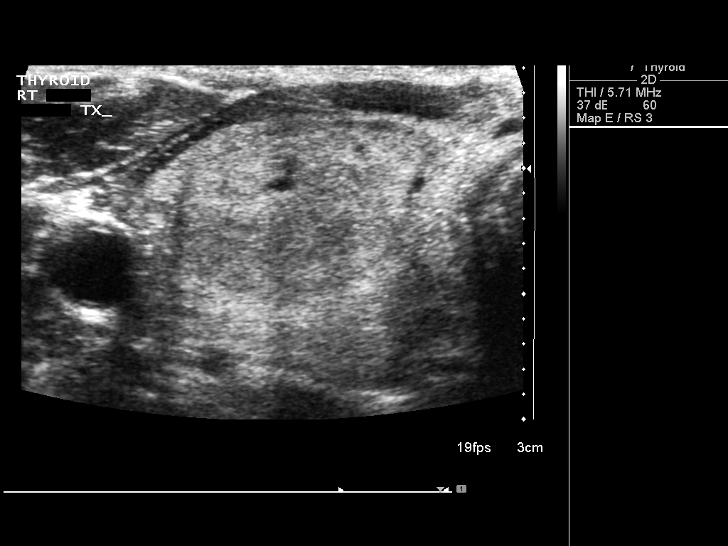

[13 of 13 positions shown; findings below may reference images not displayed]

MEDICATIONS:
1% lidocaine

COMPLICATIONS:
None immediate.

PROCEDURE:
Informed written consent was obtained from the patient after a
thorough discussion of the procedural risks, benefits and
alternatives. All questions were addressed. Maximal Sterile Barrier
Technique was utilized including caps, mask, sterile gowns, sterile
gloves, sterile drape, hand hygiene and skin antiseptic. A timeout
was performed prior to the initiation of the procedure.

Ultrasound was performed to localize and mark an adequate site for
the biopsy. The patient was then prepped and draped in a normal
sterile fashion. Local anesthesia was provided with 1% lidocaine.
Using direct ultrasound guidance, 4 passes were made using 25 gauge
needles into the dominant nodule within the mid right lobe of the
thyroid. Ultrasound was used to confirm needle placements on all
occasions. Specimens were sent to Pathology for analysis.
IMPRESSION: Ultrasound guided needle aspirate biopsy performed of a dominant mid
right thyroid nodule. Final pathology pending.

## 2017-01-28 ENCOUNTER — Encounter: Payer: Self-pay | Admitting: Hematology and Oncology

## 2017-01-28 ENCOUNTER — Ambulatory Visit (HOSPITAL_BASED_OUTPATIENT_CLINIC_OR_DEPARTMENT_OTHER): Payer: BLUE CROSS/BLUE SHIELD | Admitting: Hematology and Oncology

## 2017-01-28 DIAGNOSIS — D0502 Lobular carcinoma in situ of left breast: Secondary | ICD-10-CM | POA: Diagnosis not present

## 2017-01-28 DIAGNOSIS — M859 Disorder of bone density and structure, unspecified: Secondary | ICD-10-CM | POA: Diagnosis not present

## 2017-01-28 MED ORDER — ANASTROZOLE 1 MG PO TABS: 1.0000 mg | ORAL_TABLET | Freq: Every day | ORAL | 3 refills | Status: DC

## 2017-01-28 NOTE — Assessment & Plan Note (Signed)
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 Dalbert Batman, exemestane started April 2015  Exemestane toxicities:  1. Occasional hot flashes especially at night: Encourage her to take exemestane at bedtime 2. Mild joint aches 3. Osteopenia T score -2.1: I encouraged the patient to take oral bisphosphonate therapy along with calcium and vitamin D as recommended by her primary care physician. I also encouraged her to continue weightbearing exercises.  Breast Cancer Surveillance: 1. Breast exam 01/28/2017: Normal 2. Mammogram March 2018 at [facility] No abnormalities. Postsurgical changes. Breast Density Category B. I recommended that she get 3-D mammograms for surveillance. Discussed the differences between different breast density categories.  Return to clinic in one year

## 2017-01-28 NOTE — Progress Notes (Signed)
Patient Care Team: Velna Hatchet, MD as PCP - General (Internal Medicine)  DIAGNOSIS:  Encounter Diagnosis  Name Primary?  . 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 She continues to have severe hot flashes and sweats at night. She has been on it for the past 3 years. She tells me that since January exemestane costs $200 per month. She denies any lumps or nodules in breast.  REVIEW OF SYSTEMS:   Constitutional: Denies fevers, chills or abnormal weight loss Eyes: Denies blurriness of vision Ears, nose, mouth, throat, and face: Denies mucositis or sore throat Respiratory: Denies cough, dyspnea or wheezes Cardiovascular: Denies palpitation, chest discomfort Gastrointestinal:  Denies nausea, heartburn or change in bowel habits Skin: Denies abnormal skin rashes Lymphatics: Denies new lymphadenopathy or easy bruising Neurological:Denies numbness, tingling or new weaknesses Behavioral/Psych: Mood is stable, no new changes  Extremities: No lower extremity edema Breast:  denies any pain or lumps or nodules in either breasts All other systems were reviewed with the patient and are negative.  I have reviewed the past medical history, past surgical history, social history and family history with the patient and they are unchanged from previous note.  ALLERGIES:  is allergic to sulfonamide derivatives.  MEDICATIONS:  Current Outpatient Prescriptions  Medication Sig Dispense Refill  . alendronate (FOSAMAX) 70 MG tablet Reported on 02/06/2016  0  . atenolol (TENORMIN) 25 MG tablet Take 1 tablet (25 mg total) by mouth daily. 30 tablet 2  . calcium carbonate (OS-CAL - DOSED IN MG OF ELEMENTAL CALCIUM) 1250 MG tablet Take 1 tablet by mouth  daily.      Marland Kitchen exemestane (AROMASIN) 25 MG tablet Take 1 tablet (25 mg total) by mouth daily after breakfast. 90 tablet 3  . ibuprofen (ADVIL,MOTRIN) 800 MG tablet take 1 tablet by mouth every 8 hours if needed 30 tablet 1  . lisinopril (PRINIVIL,ZESTRIL) 40 MG tablet Take 1 tablet (40 mg total) by mouth daily. 30 tablet 2  . PARoxetine (PAXIL) 20 MG tablet Take 1 tablet (20 mg total) by mouth every morning. 30 tablet 13  . simvastatin (ZOCOR) 40 MG tablet Take 40 mg by mouth every evening.    . traZODone (DESYREL) 50 MG tablet Take 1 tablet (50 mg total) by mouth at bedtime. 30 tablet PRN   No current facility-administered medications for this visit.     PHYSICAL EXAMINATION: ECOG PERFORMANCE STATUS: 1 - Symptomatic but completely ambulatory  Vitals:   01/28/17 1025  BP: (!) 162/70  Pulse: (!) 57  Temp: 98.1 F (36.7 C)   Filed Weights   01/28/17 1025  Weight: 123 lb 6.4 oz (56 kg)    GENERAL:alert, no distress and comfortable SKIN: skin color, texture, turgor are normal, no rashes or significant lesions EYES: normal, Conjunctiva are pink and non-injected, sclera clear OROPHARYNX:no exudate, no erythema and lips, buccal mucosa, and tongue normal  NECK: supple, thyroid normal size, non-tender, without nodularity LYMPH:  no palpable lymphadenopathy in the cervical, axillary or inguinal LUNGS: clear to auscultation and percussion with normal breathing effort HEART: regular rate & rhythm and no murmurs and no lower extremity edema ABDOMEN:abdomen soft, non-tender and normal bowel sounds MUSCULOSKELETAL:no cyanosis of digits and no clubbing  NEURO: alert & oriented x 3 with fluent speech, no  focal motor/sensory deficits EXTREMITIES: No lower extremity edema BREAST: No palpable masses or nodules in either right or left breasts. No palpable axillary supraclavicular or infraclavicular adenopathy no breast tenderness or nipple discharge. (exam performed in the presence of a  chaperone)  LABORATORY DATA:  I have reviewed the data as listed   Chemistry      Component Value Date/Time   NA 139 07/19/2014 0903   K 4.7 07/19/2014 0903   CL 102 12/08/2013 1120   CO2 24 07/19/2014 0903   BUN 22.8 07/19/2014 0903   CREATININE 0.8 07/19/2014 0903      Component Value Date/Time   CALCIUM 9.6 07/19/2014 0903   ALKPHOS 54 07/19/2014 0903   AST 23 07/19/2014 0903   ALT 21 07/19/2014 0903   BILITOT 0.49 07/19/2014 0903       Lab Results  Component Value Date   WBC 5.0 07/19/2014   HGB 12.5 07/19/2014   HCT 37.8 07/19/2014   MCV 96.0 07/19/2014   PLT 370 07/19/2014   NEUTROABS 2.7 07/19/2014    ASSESSMENT & PLAN:  Neoplasm of left breast, 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 Dalbert Batman, exemestane started April 2015  Exemestane toxicities:  1. Occasional hot flashes especially at night 2. Mild joint aches 3. Osteopenia T score -2.1: I encouraged the patient to take oral bisphosphonate therapy along with calcium and vitamin D as recommended by her primary care physician. I also encouraged her to continue weightbearing exercises. Patient informed me that she is fighting $200 per month for exemestane. I recommended switching her from exemestane to anastrozole. I sent a new prescription for anastrozole.  Breast Cancer Surveillance: 1. Breast exam 01/28/2017: Normal 2. Mammogram March 2018 at [facility] No abnormalities. Postsurgical changes. Breast Density Category B. I recommended that she get 3-D mammograms for surveillance. Discussed the differences between different breast density categories.  Return to clinic in one year   I spent 25 minutes talking to the patient of which more than half was spent in counseling and coordination of care.  No orders of the defined types were placed in this encounter.  The patient has a good understanding of the  overall plan. she agrees with it. she will call with any problems that may develop before the next visit here.   Rulon Eisenmenger, MD 01/28/17

## 2017-02-19 ENCOUNTER — Encounter: Payer: Self-pay | Admitting: Obstetrics & Gynecology

## 2017-03-05 ENCOUNTER — Telehealth: Payer: Self-pay | Admitting: Obstetrics & Gynecology

## 2017-03-05 NOTE — Telephone Encounter (Signed)
Patient has a cyst on top of her vagina that is red and sore.

## 2017-03-05 NOTE — Telephone Encounter (Signed)
Spoke with patient. Patient states that she has a cyst on the top of her vagina that Dr.Miller has seen before and feels is a sebaceous cyst. Patient reports over the last few days the area has become sore and red. "I think it is draining brown fluid." Denies any fever or chills. Reports the area has grown in size. Advised she will need to be seen for further evaluation. Offered appointment today with covering MD, but patient declines. Appointment scheduled for tomorrow at 11:15 am with Dr.Miller. Patient is agreeable to date and time.  Routing to provider for final review. Patient agreeable to disposition. Will close encounter.

## 2017-03-06 ENCOUNTER — Ambulatory Visit (INDEPENDENT_AMBULATORY_CARE_PROVIDER_SITE_OTHER): Payer: BLUE CROSS/BLUE SHIELD | Admitting: Obstetrics & Gynecology

## 2017-03-06 VITALS — BP 130/70 | HR 64 | Resp 16 | Ht 62.0 in | Wt 118.0 lb

## 2017-03-06 DIAGNOSIS — N764 Abscess of vulva: Secondary | ICD-10-CM | POA: Diagnosis not present

## 2017-03-06 DIAGNOSIS — F341 Dysthymic disorder: Secondary | ICD-10-CM

## 2017-03-06 MED ORDER — DOXYCYCLINE HYCLATE 100 MG PO CAPS: 100.0000 mg | ORAL_CAPSULE | Freq: Two times a day (BID) | ORAL | 0 refills | Status: DC

## 2017-03-06 NOTE — Progress Notes (Signed)
GYNECOLOGY  VISIT   HPI: 61 y.o. G38P0002 [name] here for complaint of redness, drainage, tenderness and some associated odor around a large sebaceous cyst that is on her mons pubis.  This started earlier this week but started draining on Wednesday.  She was offered an appt yesterday.  Denies fever.  Feels the area is less tender since it started draining.  Has long term depression and feels she has fallen into a little bit of a "funk".  She did this at about the same time last summer.  She did go see a therapist at the [facility].  She states the person she saw was a "dork" and she didn't "get anything out of the visit" . Has a follow up scheduled but would like some suggestions and to be able to cancel the apt.  When this happened last year, she doubled her Paroxtine at her PCP's suggestion and made her feel a little jittery.  Use to see Dr. Sandi Mariscal.  Has also tried  Dr. Sandi Mariscal.  Has tried Zoloft, Lexapro, and Prozac and failed all of these due to side effects.  GYNECOLOGIC HISTORY: Patient's last menstrual period was 09/24/1999. Contraception: post menopausal  Menopausal hormone therapy: none  Patient Active Problem List   Diagnosis Date Noted  . 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 Hypertension 04/22/2011  . Hyperlipidemia 04/22/2011  . Depression 04/22/2011  . Osteopenia 04/22/2011  . Thyroid nodule 04/22/2011  . Family history of colon cancer 04/22/2011    Past Medical History:  Diagnosis Date  . Acid reflux   . Atypical lobular hyperplasia of left breast 11/25/13   lumpectomy, 12/10/13  . BCC (basal cell carcinoma)   . Depression   . Hyperlipidemia   . Hypertension   . Osteopenia   . Thyroid disease    rt thyroid nodule    Past Surgical History:  Procedure Laterality Date  . BREAST LUMPECTOMY WITH RADIOACTIVE SEED LOCALIZATION Left 12/10/2013   Procedure:  BREAST LUMPECTOMY WITH RADIOACTIVE SEED LOCALIZATION;  Surgeon: [name], MD;  Location: [facility];  Service: General;  Laterality: Left;  . BUNIONECTOMY  2/13 left foot and toe straightened  . BUNIONECTOMY  3/13 right foot and toe straightened  . COLONOSCOPY    . NASAL SEPTUM SURGERY  1982    MEDS:  Reviewed in EPIC and UTD  ALLERGIES: Sulfonamide derivatives  Family History  Problem Relation Age of Onset  . Cancer Mother        colon-stage 4  . Hypertension Mother   . Colon cancer Mother   . Breast cancer Sister   . Colon polyps Neg Hx   . Esophageal cancer Neg Hx   . Rectal cancer Neg Hx   . Stomach cancer Neg Hx     SH:  Married, non smoker  Review of Systems  All other systems reviewed and are negative.   PHYSICAL EXAMINATION:    BP 130/70 (BP Location: Right Arm, Patient Position: Sitting, Cuff Size: Normal)   Pulse 64   Resp 16   Ht 5\' 2"  (1.575 m)   Wt 118 lb (53.5 kg)   LMP 09/24/1999   BMI 21.58 kg/m     Physical Exam  Constitutional: She is oriented to person, place, and time. She appears well-developed and well-nourished.  Genitourinary:    There is no rash, tenderness, lesion or injury on the right labia. There is no rash,  tenderness, lesion or injury on the left labia.  Lymphadenopathy:       Right: No inguinal adenopathy present.       Left: No inguinal adenopathy present.  Neurological: She is alert and oriented to person, place, and time.  Skin: Skin is warm and dry.  Psychiatric: She has a normal mood and affect.    Chaperone was present for exam.  Assessment: Large sebaceous cyst with surrounding cellulitis and purulent drainage x 2 days Depression  Plan: Wound culture obtained Doxycycline 100mg  bid x 7 days.  Recheck 1 week. Increased Paxil to 30mg  daily. Rx to pharmacy.  She will let me know if has same jittery sensation as in the past. Names for therapists given.  ~25 minutes spent with patient >50% of  time was in face to face discussion of above.

## 2017-03-06 NOTE — Patient Instructions (Addendum)
Pearson Grippe, MD [address], [place], [place] [address]  [phone]    Marya Amsler, [address], [place], [place] [address], [place], [place] [address]  Phone: [phone]

## 2017-03-08 LAB — WOUND CULTURE

## 2017-03-09 ENCOUNTER — Encounter: Payer: Self-pay | Admitting: Obstetrics & Gynecology

## 2017-03-10 ENCOUNTER — Telehealth: Payer: Self-pay | Admitting: Obstetrics & Gynecology

## 2017-03-10 NOTE — Telephone Encounter (Signed)
Spoke with patient, rescheduled recheck for 03/13/17 at 11:15am with Dr. Sabra Heck. Patient is agreeable to date and time.  Routing to provider for final review. Patient is agreeable to disposition. Will close encounter.

## 2017-03-10 NOTE — Telephone Encounter (Signed)
Patient has a 7-10 day recheck appointment 03/14/17. Patient is going out of town Friday afternoon and needs to reschedule. Patient is available Thursday 03/13/17 or Friday 03/14/17 in the morning. The only appointment available is Tuesday 03/11/17. Patient is asking if this would be okay to come in early for this recheck? I did not cancel the 03/14/17 appointment.

## 2017-03-13 ENCOUNTER — Ambulatory Visit (INDEPENDENT_AMBULATORY_CARE_PROVIDER_SITE_OTHER): Payer: BLUE CROSS/BLUE SHIELD | Admitting: Obstetrics & Gynecology

## 2017-03-13 ENCOUNTER — Other Ambulatory Visit: Payer: Self-pay | Admitting: *Deleted

## 2017-03-13 VITALS — BP 130/70 | HR 60 | Resp 14 | Ht 62.0 in | Wt 118.0 lb

## 2017-03-13 DIAGNOSIS — L723 Sebaceous cyst: Secondary | ICD-10-CM | POA: Diagnosis not present

## 2017-03-13 MED ORDER — DOXYCYCLINE HYCLATE 100 MG PO CAPS: 100.0000 mg | ORAL_CAPSULE | Freq: Two times a day (BID) | ORAL | 0 refills | Status: DC

## 2017-03-13 NOTE — Progress Notes (Signed)
GYNECOLOGY  VISIT   HPI: 61 y.o. G66P0002 [name] here for recheck of large sebaceous cyst with surrounding cellulitis.  The drainage has resolved per patient but there is still a little redness present.  She reports the area is still tender as well.  Denies fevers.  GYNECOLOGIC HISTORY: Patient's last menstrual period was 09/24/1999. Contraception: post menopausal  Menopausal hormone therapy: none  Patient Active Problem List   Diagnosis Date Noted  . 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 Hypertension 04/22/2011  . Hyperlipidemia 04/22/2011  . Depression 04/22/2011  . Osteopenia 04/22/2011  . Thyroid nodule 04/22/2011  . Family history of colon cancer 04/22/2011    Past Medical History:  Diagnosis Date  . Acid reflux   . Atypical lobular hyperplasia of left breast 11/25/13   lumpectomy, 12/10/13  . BCC (basal cell carcinoma)   . Depression   . Hyperlipidemia   . Hypertension   . Osteopenia   . Thyroid disease    rt thyroid nodule    Past Surgical History:  Procedure Laterality Date  . BREAST LUMPECTOMY WITH RADIOACTIVE SEED LOCALIZATION Left 12/10/2013   Procedure: BREAST LUMPECTOMY WITH RADIOACTIVE SEED LOCALIZATION;  Surgeon: [name], MD;  Location: [facility];  Service: General;  Laterality: Left;  . BUNIONECTOMY  2/13 left foot and toe straightened  . BUNIONECTOMY  3/13 right foot and toe straightened  . COLONOSCOPY    . NASAL SEPTUM SURGERY  1982    MEDS:  Reviewed in EPIC and UTD  ALLERGIES: Sulfonamide derivatives  Family History  Problem Relation Age of Onset  . Cancer Mother        colon-stage 4  . Hypertension Mother   . Colon cancer Mother   . Breast cancer Sister   . Colon polyps Neg Hx   . Esophageal cancer Neg Hx   . Rectal cancer Neg Hx   . Stomach cancer Neg Hx     SH:  Married, non smoker  Review of Systems  All other systems  reviewed and are negative.   PHYSICAL EXAMINATION:    BP 130/70 (BP Location: Right Arm, Patient Position: Sitting, Cuff Size: Normal)   Pulse 60   Resp 14   Ht 5\' 2"  (1.575 m)   Wt 118 lb (53.5 kg)   LMP 09/24/1999   BMI 21.58 kg/m     Physical Exam  Constitutional: She is oriented to person, place, and time. She appears well-developed and well-nourished.  Genitourinary:     Neurological: She is alert and oriented to person, place, and time.  Skin: Skin is warm and dry.  Psychiatric: She has a normal mood and affect.    Chaperone was present for exam.  Assessment: Vulvar sebaceous cyst with surrounding cellulitis that is much improved  Plan: Continue doxycycline 100mg  bid x 10 days.  Return for excision.  Pt going out of town this weekend  So feel best to wait to remove until she is back in town and will be here for several days in a row to allow for adequate follow up.   ~15 minutes spent with patient >50% of time was in face to face discussion of excision in office vs OR with possible risks, benefits, and complications. She would very much like to try to have it removed in the office.

## 2017-03-13 NOTE — Progress Notes (Signed)
Patient scheduled while in office for removal of sebaceous cyst on vulva. Patient scheduled on 03/18/17 at 12:45pm with Dr. Sabra Heck. Patient is agreeable to date and time.

## 2017-03-14 ENCOUNTER — Ambulatory Visit: Payer: BLUE CROSS/BLUE SHIELD | Admitting: Obstetrics & Gynecology

## 2017-03-16 ENCOUNTER — Encounter: Payer: Self-pay | Admitting: Obstetrics & Gynecology

## 2017-03-18 ENCOUNTER — Ambulatory Visit: Payer: Self-pay

## 2017-03-18 ENCOUNTER — Ambulatory Visit: Payer: Self-pay | Admitting: Obstetrics & Gynecology

## 2017-03-25 ENCOUNTER — Encounter: Payer: Self-pay | Admitting: Obstetrics & Gynecology

## 2017-03-25 ENCOUNTER — Ambulatory Visit (INDEPENDENT_AMBULATORY_CARE_PROVIDER_SITE_OTHER): Payer: BLUE CROSS/BLUE SHIELD | Admitting: Obstetrics & Gynecology

## 2017-03-25 VITALS — BP 120/68 | HR 56 | Resp 14 | Ht 62.0 in | Wt 116.2 lb

## 2017-03-25 DIAGNOSIS — L723 Sebaceous cyst: Secondary | ICD-10-CM

## 2017-03-25 MED ORDER — DOXYCYCLINE HYCLATE 100 MG PO CAPS: 100.0000 mg | ORAL_CAPSULE | Freq: Two times a day (BID) | ORAL | 0 refills | Status: DC

## 2017-03-25 MED ORDER — HYDROCODONE-ACETAMINOPHEN 5-325 MG PO TABS: 1.0000 | ORAL_TABLET | Freq: Four times a day (QID) | ORAL | 0 refills | Status: DC | PRN

## 2017-03-25 NOTE — Progress Notes (Signed)
GYNECOLOGY  VISIT   HPI: 61 y.o. G55P0002 [name] here for excision of large vulvar sebaceous cyst that had surrounding cellulitis starting in mid June.  Pt reports she's been able to get more sebacous material out of the lesion since the last time she was here and there is no more tenderness or abnormal drainage.  Denies skin redness and denies fever.  Ready for full removal of the area today.  Consent, risks and benefits reviewed.  GYNECOLOGIC HISTORY: Patient's last menstrual period was 09/24/1999. Contraception: PMP Menopausal hormone therapy: none  Patient Active Problem List   Diagnosis Date Noted  . 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 Hypertension 04/22/2011  . Hyperlipidemia 04/22/2011  . Depression 04/22/2011  . Osteopenia 04/22/2011  . Thyroid nodule 04/22/2011  . Family history of colon cancer 04/22/2011    Past Medical History:  Diagnosis Date  . Acid reflux   . Atypical lobular hyperplasia of left breast 11/25/13   lumpectomy, 12/10/13  . BCC (basal cell carcinoma)   . Depression   . Hyperlipidemia   . Hypertension   . Osteopenia   . Thyroid disease    rt thyroid nodule    Past Surgical History:  Procedure Laterality Date  . BREAST LUMPECTOMY WITH RADIOACTIVE SEED LOCALIZATION Left 12/10/2013   Procedure: BREAST LUMPECTOMY WITH RADIOACTIVE SEED LOCALIZATION;  Surgeon: [name], MD;  Location: [facility];  Service: General;  Laterality: Left;  . BUNIONECTOMY  2/13 left foot and toe straightened  . BUNIONECTOMY  3/13 right foot and toe straightened  . COLONOSCOPY    . NASAL SEPTUM SURGERY  1982    MEDS:  Reviewed in EPIC and UTD  ALLERGIES: Sulfonamide derivatives  Family History  Problem Relation Age of Onset  . Cancer Mother        colon-stage 4  . Hypertension Mother   . Colon cancer Mother   . Breast cancer Sister   . Colon polyps Neg Hx     . Esophageal cancer Neg Hx   . Rectal cancer Neg Hx   . Stomach cancer Neg Hx     SH:  Married, non smoker  Review of Systems  All other systems reviewed and are negative.   PHYSICAL EXAMINATION:    BP 120/68 (BP Location: Right Arm, Patient Position: Sitting, Cuff Size: Normal)   Pulse (!) 56   Resp 14   Ht 5\' 2"  (1.575 m)   Wt 116 lb 4 oz (52.7 kg)   LMP 09/24/1999   BMI 21.26 kg/m     Physical Exam  Constitutional: She appears well-developed and well-nourished.  Genitourinary:     Lymphadenopathy:       Right: No inguinal adenopathy present.       Left: No inguinal adenopathy present.   Procedure:  Area cleansed with Betadine x 3.  Pt draped in sterile fashion.  4cc1% Lidocaine instilled around and beneath lesion.  Lot:  [id number].  Exp 09/2018.  Using #11 blade, incision made on top of lesion measuring 4cm.  Large amount of sebaceous material removed.  Then cyst wall grasped and teased completely free from underlying tissue with the cyst wall being fully removed in two significant pieces.  Two deeper stiches of #2.0 vicryl used to close lesion and then four interrupted sutures of #3.0 vicryl placed.  Dressing applied.  Pt tolerated procedure well.  Chaperone was present for exam.  Assessment: Excision of  4cm vulvar sebaceous cyst  Plan: Pt will return for suture removal in 10 days. Doxycycline 100mg  bid x 5 days.

## 2017-03-31 ENCOUNTER — Telehealth: Payer: Self-pay | Admitting: Obstetrics & Gynecology

## 2017-03-31 NOTE — Telephone Encounter (Signed)
Spoke with patient. Patient request appointment be changed d/t leaving to go out of town. Patient rescheduled suture removal to 04/03/17 at 9:15am. 7/13 appointment cancelled. Patient is agreeable to date and time.   Routing to provider for final review. Patient is agreeable to disposition. Will close encounter.

## 2017-03-31 NOTE — Telephone Encounter (Signed)
Patient is scheduled for suture removal on Friday 7/13. She is going out of town and would like to possible get in Thursday morning.

## 2017-04-03 ENCOUNTER — Ambulatory Visit (INDEPENDENT_AMBULATORY_CARE_PROVIDER_SITE_OTHER): Payer: BLUE CROSS/BLUE SHIELD | Admitting: Obstetrics & Gynecology

## 2017-04-03 ENCOUNTER — Encounter: Payer: Self-pay | Admitting: Obstetrics & Gynecology

## 2017-04-03 VITALS — BP 120/70 | HR 66 | Resp 12 | Ht 62.0 in | Wt 115.6 lb

## 2017-04-03 DIAGNOSIS — F341 Dysthymic disorder: Secondary | ICD-10-CM | POA: Diagnosis not present

## 2017-04-03 DIAGNOSIS — L723 Sebaceous cyst: Secondary | ICD-10-CM | POA: Diagnosis not present

## 2017-04-03 NOTE — Progress Notes (Signed)
GYNECOLOGY  VISIT   HPI: 61 y.o. G65P0002 [name] here for suture removal after removal of large vulvar sebaceous cyst as well as discuss medication being used to treat dysthymia.  Reports area of excision of sebaceous cyst seems to be healing well.  She still has two days of antibiotics left and wonders if she can stop.  Currently on 30mg  Paxil as the 40mg  made her jittery.  She has been on Zoloft, Lexapor, and Prozac in the past.  Feels the jitters are better but not sure the 30mg  dosage has helped.  She is traveling out of town this weekend so she would like to wait and make changes after she returns.  Has been on the medication about three weeks so would also like to give this at least another week before making a decision about changing medication.    Patient Active Problem List   Diagnosis Date Noted  . 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 Hypertension 04/22/2011  . Hyperlipidemia 04/22/2011  . Depression 04/22/2011  . Osteopenia 04/22/2011  . Thyroid nodule 04/22/2011  . Family history of colon cancer 04/22/2011    Past Medical History:  Diagnosis Date  . Acid reflux   . Atypical lobular hyperplasia of left breast 11/25/13   lumpectomy, 12/10/13  . BCC (basal cell carcinoma)   . Depression   . Hyperlipidemia   . Hypertension   . Osteopenia   . Thyroid disease    rt thyroid nodule    Past Surgical History:  Procedure Laterality Date  . BREAST LUMPECTOMY WITH RADIOACTIVE SEED LOCALIZATION Left 12/10/2013   Procedure: BREAST LUMPECTOMY WITH RADIOACTIVE SEED LOCALIZATION;  Surgeon: [name], MD;  Location: [facility];  Service: General;  Laterality: Left;  . BUNIONECTOMY  2/13 left foot and toe straightened  . BUNIONECTOMY  3/13 right foot and toe straightened  . COLONOSCOPY    . NASAL SEPTUM SURGERY  1982    MEDS:  Reviewed in EPIC and UTD  ALLERGIES: Sulfonamide  derivatives  Family History  Problem Relation Age of Onset  . Cancer Mother        colon-stage 4  . Hypertension Mother   . Colon cancer Mother   . Breast cancer Sister   . Colon polyps Neg Hx   . Esophageal cancer Neg Hx   . Rectal cancer Neg Hx   . Stomach cancer Neg Hx     SH:  Married, non smoker  Review of Systems  Psychiatric/Behavioral:       Depressed mood    PHYSICAL EXAMINATION:    BP 120/70 (BP Location: Right Arm, Patient Position: Sitting, Cuff Size: Normal)   Pulse 66   Resp 12   Ht 5\' 2"  (1.575 m)   Wt 115 lb 9.6 oz (52.4 kg)   LMP 09/24/1999   BMI 21.14 kg/m     Physical Exam  Constitutional: She is oriented to person, place, and time. She appears well-developed and well-nourished.  Genitourinary:     Neurological: She is alert and oriented to person, place, and time.  Skin: Skin is warm and dry.  Psychiatric: She has a normal mood and affect.   Chaperone was present for exam.  Assessment: S/p post excision of sebaceous cyst on mons, healing well Dysthymia, not significantly improved at 30mg  dosage.  Failed several other SSRIs in the past.  Plan: Pt can stop antibiotics.   She will call in  another 1-2 weeks if desires change medication.  Consider SNRI or referral to mental health specialist    ~15 minutes spent with patient >50% of time was in face to face discussion of above.

## 2017-04-04 ENCOUNTER — Ambulatory Visit: Payer: BLUE CROSS/BLUE SHIELD | Admitting: Obstetrics & Gynecology

## 2017-04-23 ENCOUNTER — Telehealth: Payer: Self-pay | Admitting: Obstetrics & Gynecology

## 2017-04-23 NOTE — Telephone Encounter (Signed)
Spoke with patient. States she believes part of a stitch is still in place, catches on underwear. Recommended OV for further evaluation. Advised patient Dr. Sabra Heck is out of the office today, can schedule with covering provider, patient declined. Patient scheduled with Dr. Sabra Heck on 04/24/17 at 10:15am. Patient is agreeable to date and time.  Routing to provider for final review. Patient is agreeable to disposition. Will close encounter.

## 2017-04-23 NOTE — Telephone Encounter (Signed)
Patient has cyst removed by Dr Sabra Heck back in July.  Patient states Dr Sabra Heck removed the stitches but think she may still have one there. Says that something is sticking up and catches on her underwear.

## 2017-04-24 ENCOUNTER — Ambulatory Visit (INDEPENDENT_AMBULATORY_CARE_PROVIDER_SITE_OTHER): Payer: BLUE CROSS/BLUE SHIELD | Admitting: Obstetrics & Gynecology

## 2017-04-24 ENCOUNTER — Encounter: Payer: Self-pay | Admitting: Obstetrics & Gynecology

## 2017-04-24 VITALS — BP 118/70 | HR 58 | Resp 14 | Wt 118.6 lb

## 2017-04-24 DIAGNOSIS — F341 Dysthymic disorder: Secondary | ICD-10-CM

## 2017-04-24 MED ORDER — PAROXETINE HCL ER 37.5 MG PO TB24: 37.5000 mg | ORAL_TABLET | Freq: Every day | ORAL | 2 refills | Status: DC

## 2017-04-24 NOTE — Progress Notes (Signed)
GYNECOLOGY  VISIT   HPI: 61 y.o. G40P0002 [name] here for recheck of incision after removal of large vulvar sebaceous cyst that was removed 03/25/17.  Area has healed well but pt thinks there may be a remaining stitch present.  Would like me to look at this today.  Also, she reports she is feeling better with the 30mg  Paxil dosage.  She typically takes it at night but has a little jittery feeling in the morning an dis not sure this is related.  Overall, mood improvement is good.  Does not feel additional medication is needed.  D/w pt possibility to switching to 24 hour release medication to see if this eliminates the side effects.  She is open to trying this.  Feels some of her issue is feeling bored and having nothing to do but think about herself.  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  Has looked into work at [employer] but they don't need any help right now.  Hoping something seasonally may open up there as gets closer to the holiday.    GYNECOLOGIC HISTORY: Patient's last menstrual period was 09/24/1999. Contraception: PMP Menopausal hormone therapy: none  Patient Active Problem List   Diagnosis Date Noted  . 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 Hypertension 04/22/2011  . Hyperlipidemia 04/22/2011  . Depression 04/22/2011  . Osteopenia 04/22/2011  . Thyroid nodule 04/22/2011  . Family history of colon cancer 04/22/2011    Past Medical History:  Diagnosis Date  . Acid reflux   . Atypical lobular hyperplasia of left breast 11/25/13   lumpectomy, 12/10/13  . BCC (basal cell carcinoma)   . Depression   . Hyperlipidemia   . Hypertension   . Osteopenia   . Thyroid disease    rt thyroid nodule    Past Surgical History:  Procedure Laterality Date  . BREAST LUMPECTOMY WITH RADIOACTIVE SEED LOCALIZATION Left 12/10/2013   Procedure: BREAST LUMPECTOMY WITH RADIOACTIVE SEED LOCALIZATION;  Surgeon: [name], MD;  Location: [place];  Service: General;  Laterality: Left;  . BUNIONECTOMY  2/13 left foot and toe straightened  . BUNIONECTOMY  3/13 right foot and toe straightened  . COLONOSCOPY    . NASAL SEPTUM SURGERY  1982    MEDS:   Current Outpatient Prescriptions on File Prior to Visit  Medication Sig Dispense Refill  . alendronate (FOSAMAX) 70 MG tablet Reported on 02/06/2016  0  . anastrozole (ARIMIDEX) 1 MG tablet Take 1 tablet (1 mg total) by mouth daily. 90 tablet 3  . atenolol (TENORMIN) 25 MG tablet Take 1 tablet (25 mg total) by mouth daily. 30 tablet 2  . calcium carbonate (OS-CAL - DOSED IN MG OF ELEMENTAL CALCIUM) 1250 MG tablet Take 1 tablet by mouth daily.      Marland Kitchen ibuprofen (ADVIL,MOTRIN) 800 MG tablet take 1 tablet by mouth every 8 hours if needed 30 tablet 1  . lisinopril (PRINIVIL,ZESTRIL) 40 MG tablet Take 1 tablet (40 mg total) by mouth daily. 30 tablet 2  . simvastatin (ZOCOR) 20 MG tablet Take 1 tablet by mouth daily.  0  . traZODone (DESYREL) 50 MG tablet Take 1 tablet (50 mg total) by mouth at bedtime. 30 tablet PRN   No current facility-administered medications on file prior to visit.      ALLERGIES: Sulfonamide derivatives  Family History  Problem Relation Age of Onset  . Cancer Mother        colon-stage 4  . Hypertension Mother   . Colon cancer Mother   . Breast cancer Sister   . Colon polyps Neg Hx   . Esophageal cancer Neg Hx   . Rectal cancer Neg Hx   . Stomach cancer Neg Hx     SH:  Married, non smoker  Review of Systems  All other systems reviewed and are negative.   PHYSICAL EXAMINATION:    BP 118/70 (BP Location: Right Arm, Patient Position: Sitting, Cuff Size: Normal)   Pulse (!) 58   Resp 14   Wt 118 lb 9.6 oz (53.8 kg)   LMP 09/24/1999   BMI 21.69 kg/m     Physical Exam  Constitutional: She is oriented to person, place, and  time. She appears well-developed and well-nourished.  Abdominal:  Incision healing well but two sutures were still present and removed today.  Neurological: She is alert and oriented to person, place, and time.  Skin: Skin is warm and dry.  Psychiatric: She has a normal mood and affect.   Chaperone was present for exam.  Assessment: S/P excision of large vulvar sebaceous cyst with two stitches still present and removed today Dysthymia, improved, but with some mild medication side effects  Plan: Will change to Paxil CR 37.5mg  daily to see if this will help.  Rx to pharmacy.  #30/2RF.  Pt will call and give update in two to four weeks regarding side effects/changes/response to medication.  She knows to call back if too expensive.   ~15 minutes spent with patient >50% of time was in face to face discussion of above.

## 2017-07-13 ENCOUNTER — Other Ambulatory Visit: Payer: Self-pay | Admitting: Obstetrics & Gynecology

## 2017-07-14 ENCOUNTER — Telehealth: Payer: Self-pay | Admitting: Obstetrics & Gynecology

## 2017-07-14 MED ORDER — DOXYCYCLINE HYCLATE 100 MG PO CAPS: 100.0000 mg | ORAL_CAPSULE | Freq: Two times a day (BID) | ORAL | 0 refills | Status: AC

## 2017-07-14 NOTE — Telephone Encounter (Signed)
Please send in rx for doxycyline 100mg  bid x 7 days as well.  I will see her tomorrow and see if anything else needs to be done.  Thanks.

## 2017-07-14 NOTE — Addendum Note (Signed)
Addended by: Burnice Logan on: 07/14/2017 01:30 PM   Modules accepted: Orders

## 2017-07-14 NOTE — Telephone Encounter (Signed)
Patient is asking for a refill of doxycycline. Patient states that she had a vulvar cyst that was removed and thinks she may need doxycycline again to treat area.

## 2017-07-14 NOTE — Telephone Encounter (Signed)
Spoke with patient. Patient requesting RX doxycyline for vulvar cyst.   Reports she noticed while in the shower this morning hardened, red, sore, "pea size" area at top of vulva to the right. States this is the same location she had developed a cyst previously. Pressed the area and "hard, puss" noted at first with blood tinged d/c to follow.   States area was left with small hole, cleaned with cotton ball and sprayed on hydrogen peroxide.  Recommended OV for further evaluation, scheduled for 10/23 at 9am with Dr. Sabra Heck. Confirmed pharmacy on file. Advised patient Dr. Sabra Heck will review, if any additional recommendations will return call. Patient verbalizes understanding and is agreeable.   Routing to provider for final review. Patient is agreeable to disposition. Will close encounter.

## 2017-07-14 NOTE — Telephone Encounter (Signed)
Spoke with patient, advised as seen below per Dr. Sabra Heck. Rx for doxycycline sent to pharmacy on file. Patient verbalizes understanding and is agreeable. Will close encounter.

## 2017-07-15 ENCOUNTER — Ambulatory Visit (INDEPENDENT_AMBULATORY_CARE_PROVIDER_SITE_OTHER): Payer: BLUE CROSS/BLUE SHIELD | Admitting: Obstetrics & Gynecology

## 2017-07-15 ENCOUNTER — Encounter: Payer: Self-pay | Admitting: Obstetrics & Gynecology

## 2017-07-15 VITALS — BP 136/80 | HR 58 | Resp 12 | Wt 115.6 lb

## 2017-07-15 DIAGNOSIS — L723 Sebaceous cyst: Secondary | ICD-10-CM | POA: Diagnosis not present

## 2017-07-15 DIAGNOSIS — F341 Dysthymic disorder: Secondary | ICD-10-CM | POA: Diagnosis not present

## 2017-07-15 MED ORDER — PAROXETINE HCL 30 MG PO TABS: 30.0000 mg | ORAL_TABLET | ORAL | Status: DC

## 2017-07-15 MED ORDER — MUPIROCIN 2 % EX OINT: 1.0000 "application " | TOPICAL_OINTMENT | Freq: Two times a day (BID) | CUTANEOUS | 0 refills | Status: DC

## 2017-07-18 NOTE — Progress Notes (Signed)
GYNECOLOGY  VISIT  CC:   Vulvar cyst  HPI: 61 y.o. G10P0002 [name] here for assessment of recurrent vulvar sebaceous cyst.  A very large vulvar cyst was removed earlier this year.  Pt reports it healed very well.  Then a couple of days ago, a small area became inflamed and firm.  This was tender.  She pressed it and a small amount of whitish material drained from it.  She called to be seen.  Doxycycline BID was started.  She reports the redness is now almost gone after being on the antibiotics for >24 hours.  Denies fever.  Reports that her depression is much better.  She is taking paxil 30mg  and this has really helped.  Did try and get the CR filled but this was too expensive.  She is sleeping well.    GYNECOLOGIC HISTORY: Patient's last menstrual period was 09/24/1999. Contraception: PMP Menopausal hormone therapy: none  Patient Active Problem List   Diagnosis Date Noted  . 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 Hypertension 04/22/2011  . Hyperlipidemia 04/22/2011  . Depression 04/22/2011  . Osteopenia 04/22/2011  . Thyroid nodule 04/22/2011  . Family history of colon cancer 04/22/2011    Past Medical History:  Diagnosis Date  . Acid reflux   . Atypical lobular hyperplasia of left breast 11/25/13   lumpectomy, 12/10/13  . BCC (basal cell carcinoma)   . Depression   . Hyperlipidemia   . Hypertension   . Osteopenia   . Thyroid disease    rt thyroid nodule    Past Surgical History:  Procedure Laterality Date  . BREAST LUMPECTOMY WITH RADIOACTIVE SEED LOCALIZATION Left 12/10/2013   Procedure: BREAST LUMPECTOMY WITH RADIOACTIVE SEED LOCALIZATION;  Surgeon: [name], MD;  Location: [place] [place];  Service: General;  Laterality: Left;  . BUNIONECTOMY  2/13 left foot and toe straightened  . BUNIONECTOMY  3/13 right foot and toe straightened  . COLONOSCOPY    . NASAL SEPTUM  SURGERY  1982    MEDS:   Current Outpatient Prescriptions on File Prior to Visit  Medication Sig Dispense Refill  . alendronate (FOSAMAX) 70 MG tablet Reported on 02/06/2016  0  . anastrozole (ARIMIDEX) 1 MG tablet Take 1 tablet (1 mg total) by mouth daily. 90 tablet 3  . atenolol (TENORMIN) 25 MG tablet Take 1 tablet (25 mg total) by mouth daily. 30 tablet 2  . calcium carbonate (OS-CAL - DOSED IN MG OF ELEMENTAL CALCIUM) 1250 MG tablet Take 1 tablet by mouth daily.      Marland Kitchen ibuprofen (ADVIL,MOTRIN) 800 MG tablet take 1 tablet by mouth every 8 hours if needed 30 tablet 1  . lisinopril (PRINIVIL,ZESTRIL) 40 MG tablet Take 1 tablet (40 mg total) by mouth daily. 30 tablet 2  . simvastatin (ZOCOR) 20 MG tablet Take 1 tablet by mouth daily.  0  . traZODone (DESYREL) 50 MG tablet Take 1 tablet (50 mg total) by mouth at bedtime. 30 tablet PRN  . doxycycline (VIBRAMYCIN) 100 MG capsule Take 1 capsule (100 mg total) by mouth 2 (two) times daily. (Patient not taking: Reported on 07/15/2017) 14 capsule 0   No current facility-administered medications on file prior to visit.     ALLERGIES: Sulfonamide derivatives  Family History  Problem Relation Age of Onset  . Cancer Mother        colon-stage 4  . Hypertension Mother   . Colon  cancer Mother   . Breast cancer Sister   . Colon polyps Neg Hx   . Esophageal cancer Neg Hx   . Rectal cancer Neg Hx   . Stomach cancer Neg Hx     SH:  Non smoker, married  Review of Systems  All other systems reviewed and are negative.   PHYSICAL EXAMINATION:    BP 136/80 (BP Location: Right Arm, Patient Position: Sitting, Cuff Size: Normal)   Pulse (!) 58   Resp 12   Wt 115 lb 9.6 oz (52.4 kg)   LMP 09/24/1999   BMI 21.14 kg/m     Physical Exam  Constitutional: She is oriented to person, place, and time. She appears well-developed and well-nourished.  Genitourinary:     Neurological: She is alert and oriented to person, place, and time.  Skin: Skin  is warm and dry.  Psychiatric: She has a normal mood and affect.   Assessment: Vulvar sebaceous cyst Dysthymia, improved  Plan: Pt and I discussed repeat excision of vulvar sebaceous cyst.  Feel this needs to be done in the OR to get a larger area for removal to try and prevent this from returning.  Pt not sure she wants this done a this time.  Will finish antibiotics and see how long it takes for a recurrent sebaceous cyst to return.   ~15 minutes spent with patient >50% of time was in face to face discussion of above.

## 2017-08-01 ENCOUNTER — Telehealth: Payer: Self-pay | Admitting: Obstetrics & Gynecology
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
## 2017-08-09 ENCOUNTER — Encounter (HOSPITAL_COMMUNITY): Payer: Self-pay | Admitting: Emergency Medicine

## 2017-08-09 ENCOUNTER — Other Ambulatory Visit: Payer: Self-pay

## 2017-08-09 ENCOUNTER — Ambulatory Visit (HOSPITAL_COMMUNITY)
Admission: EM | Admit: 2017-08-09 | Discharge: 2017-08-09 | Disposition: A | Discharge status: Home / Self Care | Payer: BLUE CROSS/BLUE SHIELD | Attending: Emergency Medicine | Admitting: Emergency Medicine

## 2017-08-09 DIAGNOSIS — T148XXA Other injury of unspecified body region, initial encounter: Secondary | ICD-10-CM

## 2017-08-09 DIAGNOSIS — M62838 Other muscle spasm: Secondary | ICD-10-CM | POA: Diagnosis not present

## 2017-08-09 DIAGNOSIS — M546 Pain in thoracic spine: Secondary | ICD-10-CM

## 2017-08-09 MED ORDER — METHOCARBAMOL 500 MG PO TABS: 500.0000 mg | ORAL_TABLET | Freq: Two times a day (BID) | ORAL | 0 refills | Status: DC

## 2017-08-09 MED ORDER — TRAMADOL HCL 50 MG PO TABS: 50.0000 mg | ORAL_TABLET | Freq: Four times a day (QID) | ORAL | 0 refills | Status: DC | PRN

## 2017-08-09 MED ORDER — NAPROXEN 250 MG PO TABS: 250.0000 mg | ORAL_TABLET | Freq: Two times a day (BID) | ORAL | 0 refills | Status: DC

## 2017-08-09 NOTE — ED Provider Notes (Signed)
[facility]    CSN: [id number] Arrival date & time: 08/09/17  1213     History   Chief Complaint Chief Complaint  Patient presents with  . Back Pain    HPI Robin Barton is a 61 y.o. female.   Seek to 6-year-old female complaining of mid and lower rack pain since the end of September. She states the pain is mostly constant. Occasionally is worse with certain movements. She also notes that when she leans forward beyond 90 that that actually helps her back feel better. She complains of tenderness to the skin as if it were irritated. Denies any trauma. No known injury or activity that may calls the pain. She has stridorous TC patches, ibuprofen 800 mg that have not helped. She has tried yoga and massage which only helps temporarily. Denies focal paresthesias or weakness, saddle anesthesia or incontinence.      Past Medical History:  Diagnosis Date  . Acid reflux   . Atypical lobular hyperplasia of left breast 11/25/13   lumpectomy, 12/10/13  . BCC (basal cell carcinoma)   . Depression   . Hyperlipidemia   . Hypertension   . Osteopenia   . Thyroid disease    rt thyroid nodule    Patient Active Problem List   Diagnosis Date Noted  . 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 Hypertension 04/22/2011  . Hyperlipidemia 04/22/2011  . Depression 04/22/2011  . Osteopenia 04/22/2011  . Thyroid nodule 04/22/2011  . Family history of colon cancer 04/22/2011    Past Surgical History:  Procedure Laterality Date  . BREAST LUMPECTOMY WITH RADIOACTIVE SEED LOCALIZATION Left 12/10/2013   Performed by Fanny Skates, MD at [facility]  . BUNIONECTOMY  2/13 left foot and toe straightened  . BUNIONECTOMY  3/13 right foot and toe straightened  . COLONOSCOPY    . NASAL SEPTUM SURGERY  1982    OB History    Gravida Para Term Preterm AB Living   2 2 0 0 0 2   SAB TAB Ectopic Multiple  Live Births   0 0 0 0 2       Home Medications    Prior to Admission medications   Medication Sig Start Date End Date Taking? Authorizing Provider  alendronate (FOSAMAX) 70 MG tablet Reported on 02/06/2016 01/04/15   [provider]  anastrozole (ARIMIDEX) 1 MG tablet Take 1 tablet (1 mg total) by mouth daily. 01/28/17   Nicholas Lose, MD  atenolol (TENORMIN) 25 MG tablet Take 1 tablet (25 mg total) by mouth daily. 07/22/11   Elby Showers, MD  calcium carbonate (OS-CAL - DOSED IN MG OF ELEMENTAL CALCIUM) 1250 MG tablet Take 1 tablet by mouth daily.      [provider]  lisinopril (PRINIVIL,ZESTRIL) 40 MG tablet Take 1 tablet (40 mg total) by mouth daily. 07/22/11   Elby Showers, MD  methocarbamol (ROBAXIN) 500 MG tablet Take 1 tablet (500 mg total) 2 (two) times daily by mouth. 08/09/17   Janne Napoleon, NP  mupirocin ointment (BACTROBAN) 2 % Place 1 application into the nose 2 (two) times daily. 07/15/17   Megan Salon, MD  naproxen (NAPROSYN) 250 MG tablet Take 1 tablet (250 mg total) 2 (two) times daily with a meal by mouth. 08/09/17   Janne Napoleon, NP  PARoxetine (PAXIL) 30 MG tablet Take 1 tablet (30 mg total) by mouth every morning. 07/15/17  Megan Salon, MD  simvastatin (ZOCOR) 20 MG tablet Take 1 tablet by mouth daily. 02/10/17   [provider]  traMADol (ULTRAM) 50 MG tablet Take 1 tablet (50 mg total) every 6 (six) hours as needed by mouth. 08/09/17   Janne Napoleon, NP  traZODone (DESYREL) 50 MG tablet Take 1 tablet (50 mg total) by mouth at bedtime. 05/14/11   Elby Showers, MD    Family History Family History  Problem Relation Age of Onset  . Cancer Mother        colon-stage 4  . Hypertension Mother   . Colon cancer Mother   . Breast cancer Sister   . Colon polyps Neg Hx   . Esophageal cancer Neg Hx   . Rectal cancer Neg Hx   . Stomach cancer Neg Hx     Social History Social History   Tobacco Use  . Smoking status: Current Some Day  Smoker    Packs/day: 0.50    Years: 35.00    Pack years: 17.50    Types: Cigarettes  . Smokeless tobacco: Never Used  Substance Use Topics  . Alcohol use: Yes    Comment: 3 glasses of wine nightly  . Drug use: No     Allergies   Sulfonamide derivatives   Review of Systems Review of Systems  Constitutional: Negative for activity change, chills and fever.  HENT: Negative.   Respiratory: Negative.   Cardiovascular: Negative.   Musculoskeletal:       As per HPI  Skin: Negative for color change, pallor, rash and wound.  Neurological: Negative.   All other systems reviewed and are negative.    Physical Exam Triage Vital Signs ED Triage Vitals  Enc Vitals Group     BP 08/09/17 1326 114/61     Pulse Rate 08/09/17 1326 61     Resp 08/09/17 1326 18     Temp 08/09/17 1326 98.5 F (36.9 C)     Temp Source 08/09/17 1326 Oral     SpO2 08/09/17 1326 99 %     Weight --      Height --      Head Circumference --      Peak Flow --      Pain Score 08/09/17 1324 7     Pain Loc --      Pain Edu? --      Excl. in [place]? --    No data found.  Updated Vital Signs BP 114/61 (BP Location: Left Arm)   Pulse 61   Temp 98.5 F (36.9 C) (Oral)   Resp 18   LMP 09/24/1999   SpO2 99%   Visual Acuity Right Eye Distance:   Left Eye Distance:   Bilateral Distance:    Right Eye Near:   Left Eye Near:    Bilateral Near:     Physical Exam  Constitutional: She is oriented to person, place, and time. She appears well-developed and well-nourished. No distress.  HENT:  Head: Normocephalic and atraumatic.  Eyes: EOM are normal. Pupils are equal, round, and reactive to light.  Neck: Normal range of motion. Neck supple.  Musculoskeletal: Normal range of motion. She exhibits no edema or deformity.  Tenderness to the thoracic and lower back musculature. No direct spinal tenderness, deformity or step-off deformity. No discoloration. No rash. Positive for small areas of muscle spasms. Endo  [place], normal gait. Able to get up and down from a chair flex it beyond 90 with spine.  Neurological:  She is alert and oriented to person, place, and time. No cranial nerve deficit.  Skin: Skin is warm and dry.  Psychiatric: She has a normal mood and affect.     UC Treatments / Results  Labs (all labs ordered are listed, but only abnormal results are displayed) Labs Reviewed - No data to display  EKG  EKG Interpretation None       Radiology No results found.  Procedures Procedures (including critical care time)  Medications Ordered in UC Medications - No data to display   Initial Impression / Assessment and Plan / UC Course  I have reviewed the triage vital signs and the nursing notes.  Pertinent labs & imaging results that were available during my care of the patient were reviewed by me and considered in my medical decision making (see chart for details).    Continue with heat and stretches. Follow-up with your primary care doctor next week as directed. The accompanying instructions. The medicines given can cause drowsiness.     Final Clinical Impressions(s) / UC Diagnoses   Final diagnoses:  Muscle strain  Acute bilateral thoracic back pain  Muscle spasm    ED Discharge Orders        Ordered    traMADol (ULTRAM) 50 MG tablet  Every 6 hours PRN     08/09/17 1358    methocarbamol (ROBAXIN) 500 MG tablet  2 times daily     08/09/17 1358    naproxen (NAPROSYN) 250 MG tablet  2 times daily with meals     08/09/17 1358       Controlled Substance Prescriptions [place] Controlled Substance Registry consulted? Not Applicable   Janne Napoleon, NP 08/09/17 [phone]

## 2017-08-09 NOTE — ED Triage Notes (Signed)
Back pain for a month.  Pain is mid to lower back and bilateral hip pain.  States skin is sore.  Movement causes pain in this area.

## 2017-08-09 NOTE — Discharge Instructions (Signed)
Continue with heat and stretches. Follow-up with your primary care doctor next week as directed. The accompanying instructions. The medicines given can cause drowsiness.

## 2017-09-02 ENCOUNTER — Other Ambulatory Visit: Payer: Self-pay | Admitting: Internal Medicine

## 2017-09-02 DIAGNOSIS — E785 Hyperlipidemia, unspecified: Secondary | ICD-10-CM

## 2017-09-02 DIAGNOSIS — Z720 Tobacco use: Secondary | ICD-10-CM

## 2017-11-25 ENCOUNTER — Other Ambulatory Visit: Payer: Self-pay

## 2017-11-25 ENCOUNTER — Encounter: Payer: Self-pay | Admitting: Obstetrics & Gynecology

## 2017-11-25 ENCOUNTER — Ambulatory Visit: Payer: No Typology Code available for payment source | Admitting: Obstetrics & Gynecology

## 2017-11-25 VITALS — BP 108/50 | HR 74 | Resp 16 | Ht 60.5 in | Wt 114.0 lb

## 2017-11-25 DIAGNOSIS — Z01419 Encounter for gynecological examination (general) (routine) without abnormal findings: Secondary | ICD-10-CM | POA: Diagnosis not present

## 2017-11-25 NOTE — Progress Notes (Signed)
62 y.o. [id number] MarriedCaucasianF here for annual exam.  Is seeing Dr. Clovis Pu at [facility].  Started her on lithium.  On 150mg  bid now.  Mood and emotions are much more stable.  Very happy with seeing Dr. Clovis Pu.  Stopped simvistatin due to joint pain.  Feels much better off this.    Denies vaginal bleeding.      PCP:  Dr. Jackelyn Poling.  Will have physical done in April with blood work.  Patient's last menstrual period was 09/24/1999.          Sexually active: Yes.    The current method of family planning is post menopausal status.    Exercising: Yes.    gardening, yoga, walking Smoker:  yes  Health Maintenance: Pap:  08/20/16 Neg. HR HPV:neg  06/15/14 Neg  History of abnormal Pap:  no MMG:  12/30/16 BIRADS1:neg  Colonoscopy:  02/06/16 polyps. F/u 3 years  BMD:   01/03/15  TDaP:  04/2009 Pneumonia vaccine(s):  2016 Shingrix:   Completed 2018 Hep C testing: done with PCP Screening Labs: PCP   reports that she has been smoking cigarettes.  She has a 17.50 pack-year smoking history. she has never used smokeless tobacco. She reports that she drinks alcohol. She reports that she does not use drugs.  Past Medical History:  Diagnosis Date  . Acid reflux   . Atypical lobular hyperplasia of left breast 11/25/13   lumpectomy, 12/10/13  . BCC (basal cell carcinoma)   . Depression   . Hyperlipidemia   . Hypertension   . Osteopenia   . Thyroid disease    rt thyroid nodule    Past Surgical History:  Procedure Laterality Date  . BREAST LUMPECTOMY WITH RADIOACTIVE SEED LOCALIZATION Left 12/10/2013   Procedure: BREAST LUMPECTOMY WITH RADIOACTIVE SEED LOCALIZATION;  Surgeon: [name], MD;  Location: [facility];  Service: General;  Laterality: Left;  . BUNIONECTOMY  2/13 left foot and toe straightened  . BUNIONECTOMY  3/13 right foot and toe straightened  . COLONOSCOPY    . NASAL SEPTUM SURGERY  1982    Current Outpatient Medications  Medication Sig Dispense  Refill  . alendronate (FOSAMAX) 70 MG tablet Reported on 02/06/2016  0  . anastrozole (ARIMIDEX) 1 MG tablet Take 1 tablet (1 mg total) by mouth daily. 90 tablet 3  . atenolol (TENORMIN) 25 MG tablet Take 1 tablet (25 mg total) by mouth daily. 30 tablet 2  . calcium carbonate (OS-CAL - DOSED IN MG OF ELEMENTAL CALCIUM) 1250 MG tablet Take 1 tablet by mouth daily.      Marland Kitchen lisinopril (PRINIVIL,ZESTRIL) 40 MG tablet Take 1 tablet (40 mg total) by mouth daily. 30 tablet 2  . lithium carbonate 150 MG capsule Take 2 capsules by mouth at bedtime.  0  . meloxicam (MOBIC) 15 MG tablet Take 15 mg by mouth daily.  0  . methocarbamol (ROBAXIN) 500 MG tablet Take 1 tablet (500 mg total) 2 (two) times daily by mouth. 20 tablet 0  . naproxen (NAPROSYN) 250 MG tablet Take 1 tablet (250 mg total) 2 (two) times daily with a meal by mouth. 20 tablet 0  . PARoxetine (PAXIL) 30 MG tablet Take 1 tablet (30 mg total) by mouth every morning.    . traZODone (DESYREL) 50 MG tablet Take 1 tablet (50 mg total) by mouth at bedtime. 30 tablet PRN   No current facility-administered medications for this visit.     Family History  Problem Relation Age of  Onset  . Cancer Mother        colon-stage 4  . Hypertension Mother   . Colon cancer Mother   . Breast cancer Sister   . Colon polyps Neg Hx   . Esophageal cancer Neg Hx   . Rectal cancer Neg Hx   . Stomach cancer Neg Hx     ROS:  Pertinent items are noted in HPI.  Otherwise, a comprehensive ROS was negative.  Exam:   BP (!) 108/50 (BP Location: Right Arm, Patient Position: Sitting, Cuff Size: Normal)   Pulse 74   Resp 16   Ht 5' 0.5" (1.537 m)   Wt 114 lb (51.7 kg)   LMP 09/24/1999   BMI 21.90 kg/m    Height: 5' 0.5" (153.7 cm)  Ht Readings from Last 3 Encounters:  11/25/17 5' 0.5" (1.537 m)  04/03/17 5\' 2"  (1.575 m)  03/25/17 5\' 2"  (1.575 m)    General appearance: alert, cooperative and appears stated age Head: Normocephalic, without obvious  abnormality, atraumatic Neck: no adenopathy, supple, symmetrical, trachea midline and thyroid normal to inspection and palpation Lungs: clear to auscultation bilaterally Breasts: normal appearance, no masses or tenderness Heart: regular rate and rhythm Abdomen: soft, non-tender; bowel sounds normal; no masses,  no organomegaly Extremities: extremities normal, atraumatic, no cyanosis or edema Skin: Skin color, texture, turgor normal. No rashes or lesions Lymph nodes: Cervical, supraclavicular, and axillary nodes normal. No abnormal inguinal nodes palpated Neurologic: Grossly normal   Pelvic: External genitalia:  no lesions              Urethra:  normal appearing urethra with no masses, tenderness or lesions              Bartholins and Skenes: normal                 Vagina: normal appearing vagina with normal color and discharge, no lesions              Cervix: no lesions              Pap taken: No. Bimanual Exam:  Uterus:  normal size, contour, position, consistency, mobility, non-tender              Adnexa: normal adnexa and no mass, fullness, tenderness               Rectovaginal: Confirms               Anus:  normal sphincter tone, no lesions  Chaperone was present for exam.  A:  Well Woman with normal exam PMP, no HRT H/o BCC of skin in 2011.  Followed by dermatology now yearly. 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  On Aromasin for 5 years.  Started 9/15.   Osteopenia.  On fosamax Family hx of colon cancer in her mother in early 57s.  Personal hx of adenomatous polyps Depression/anxiety  P:   Mammogram guidelines reviewed.  Doing 3D pap smear and HR HPV obtained 11/17.  Pap not indicated today BMD and colonoscopy UTD Lab work will be done in April.  Vaccines UTD. return annually or prn

## 2018-01-09 ENCOUNTER — Telehealth: Payer: Self-pay | Admitting: Hematology and Oncology

## 2018-01-09 NOTE — Telephone Encounter (Signed)
Called pt re moving appt to 5/23 due to [facility] having bmdc. Moved a week out due to pt being out of town.

## 2018-01-20 ENCOUNTER — Encounter: Payer: Self-pay | Admitting: Obstetrics & Gynecology

## 2018-01-20 ENCOUNTER — Other Ambulatory Visit: Payer: Self-pay | Admitting: Internal Medicine

## 2018-01-20 DIAGNOSIS — Z0000 Encounter for general adult medical examination without abnormal findings: Secondary | ICD-10-CM

## 2018-01-20 DIAGNOSIS — E785 Hyperlipidemia, unspecified: Secondary | ICD-10-CM

## 2018-01-28 ENCOUNTER — Ambulatory Visit: Payer: BLUE CROSS/BLUE SHIELD | Admitting: Hematology and Oncology

## 2018-01-30 ENCOUNTER — Ambulatory Visit: Payer: BLUE CROSS/BLUE SHIELD | Admitting: Hematology and Oncology

## 2018-02-12 ENCOUNTER — Inpatient Hospital Stay: Payer: Self-pay | Attending: Hematology and Oncology | Admitting: Hematology and Oncology

## 2018-02-12 ENCOUNTER — Telehealth: Payer: Self-pay | Admitting: Hematology and Oncology

## 2018-02-12 DIAGNOSIS — Z170 Estrogen receptor positive status [ER+]: Secondary | ICD-10-CM | POA: Insufficient documentation

## 2018-02-12 DIAGNOSIS — M791 Myalgia, unspecified site: Secondary | ICD-10-CM | POA: Insufficient documentation

## 2018-02-12 DIAGNOSIS — Z882 Allergy status to sulfonamides status: Secondary | ICD-10-CM | POA: Insufficient documentation

## 2018-02-12 DIAGNOSIS — R232 Flushing: Secondary | ICD-10-CM | POA: Insufficient documentation

## 2018-02-12 DIAGNOSIS — M8580 Other specified disorders of bone density and structure, unspecified site: Secondary | ICD-10-CM | POA: Insufficient documentation

## 2018-02-12 DIAGNOSIS — D0502 Lobular carcinoma in situ of left breast: Secondary | ICD-10-CM | POA: Insufficient documentation

## 2018-02-12 DIAGNOSIS — Z79811 Long term (current) use of aromatase inhibitors: Secondary | ICD-10-CM | POA: Insufficient documentation

## 2018-02-12 DIAGNOSIS — Z79899 Other long term (current) drug therapy: Secondary | ICD-10-CM | POA: Insufficient documentation

## 2018-02-12 MED ORDER — ANASTROZOLE 1 MG PO TABS: 1.0000 mg | ORAL_TABLET | Freq: Every day | ORAL | 3 refills | Status: DC

## 2018-02-12 NOTE — Telephone Encounter (Signed)
Gave patient AVs and calendar of upcoming may 2020 appointments °

## 2018-02-12 NOTE — Assessment & Plan Note (Signed)
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 Dalbert Batman, exemestane started April 2015 switch to letrozole 01/28/2017  Letrozole toxicities:  1. Occasional hot flashes especially at night 2. Mild joint aches 3. Osteopenia T score -2.1: I encouraged the patient to take oral bisphosphonate therapy along with calcium and vitamin D as recommended by her primary care physician. I also encouraged her to continue weightbearing exercises.  Breast Cancer Surveillance: 1. Breast exam 02/12/2018: Benign 2. Mammogram  12/31/2017 at [facility] No abnormalities. Postsurgical changes. Breast Density Category B  Return to clinic in 1 year for follow-up

## 2018-02-12 NOTE — Progress Notes (Signed)
Patient Care Team: Velna Hatchet, MD as PCP - General (Internal Medicine)  DIAGNOSIS:  Encounter Diagnosis  Name Primary?  . Neoplasm of left breast, 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)     CHIEF COMPLIANT: Follow-up of LCIS on letrozole therapy  INTERVAL HISTORY: Robin Barton is a 62 year old with above-mentioned history of LCIS who is currently on risk reduction therapy with letrozole.  She appears to be tolerating letrozole fairly well.  She has very occasional hot flashes and myalgias.  She denies any lumps or nodules in the breast.  REVIEW OF SYSTEMS:   Constitutional: Denies fevers, chills or abnormal weight loss Eyes: Denies blurriness of vision Ears, nose, mouth, throat, and face: Denies mucositis or sore throat Respiratory: Denies cough, dyspnea or wheezes Cardiovascular: Denies palpitation, chest discomfort Gastrointestinal:  Denies nausea, heartburn or change in bowel habits Skin: Denies abnormal skin rashes Lymphatics: Denies new lymphadenopathy or easy bruising Neurological:Denies numbness, tingling or new weaknesses Behavioral/Psych: Mood is stable, no new changes  Extremities: No lower extremity edema Breast:  denies any pain or lumps or nodules in either breasts All other systems were reviewed with the patient and are negative.  I have reviewed the past medical history, past surgical history, social history and family history with the patient and they are unchanged from previous note.  ALLERGIES:  is allergic to sulfonamide derivatives.  MEDICATIONS:  Current Outpatient Medications  Medication Sig Dispense Refill  . alendronate (FOSAMAX) 70 MG tablet Reported on 02/06/2016  0  . anastrozole (ARIMIDEX) 1 MG tablet Take 1 tablet (1 mg total) by mouth daily. 90 tablet 3  . atenolol (TENORMIN) 25 MG tablet Take 1 tablet (25 mg total) by mouth daily. 30 tablet 2  . calcium carbonate (OS-CAL - DOSED IN MG OF ELEMENTAL  CALCIUM) 1250 MG tablet Take 1 tablet by mouth daily.      Marland Kitchen lisinopril (PRINIVIL,ZESTRIL) 40 MG tablet Take 1 tablet (40 mg total) by mouth daily. 30 tablet 2  . lithium carbonate 150 MG capsule Take 2 capsules by mouth at bedtime.  0  . meloxicam (MOBIC) 15 MG tablet Take 15 mg by mouth daily.  0  . methocarbamol (ROBAXIN) 500 MG tablet Take 1 tablet (500 mg total) 2 (two) times daily by mouth. 20 tablet 0  . naproxen (NAPROSYN) 250 MG tablet Take 1 tablet (250 mg total) 2 (two) times daily with a meal by mouth. 20 tablet 0  . PARoxetine (PAXIL) 30 MG tablet Take 1 tablet (30 mg total) by mouth every morning.    . traZODone (DESYREL) 50 MG tablet Take 1 tablet (50 mg total) by mouth at bedtime. 30 tablet PRN   No current facility-administered medications for this visit.     PHYSICAL EXAMINATION: ECOG PERFORMANCE STATUS: 1 - Symptomatic but completely ambulatory  Vitals:   02/12/18 1042  BP: (!) 125/56  Pulse: (!) 52  Resp: 18  Temp: 98.3 F (36.8 C)  SpO2: 98%   Filed Weights   02/12/18 1042  Weight: 113 lb 8 oz (51.5 kg)    GENERAL:alert, no distress and comfortable SKIN: skin color, texture, turgor are normal, no rashes or significant lesions EYES: normal, Conjunctiva are pink and non-injected, sclera clear OROPHARYNX:no exudate, no erythema and lips, buccal mucosa, and tongue normal  NECK: supple, thyroid normal size, non-tender, without nodularity LYMPH:  no palpable lymphadenopathy in the cervical, axillary or inguinal LUNGS: clear to auscultation and percussion with normal breathing effort HEART: regular  rate & rhythm and no murmurs and no lower extremity edema ABDOMEN:abdomen soft, non-tender and normal bowel sounds MUSCULOSKELETAL:no cyanosis of digits and no clubbing  NEURO: alert & oriented x 3 with fluent speech, no focal motor/sensory deficits EXTREMITIES: No lower extremity edema BREAST: No palpable masses or nodules in either right or left breasts. No palpable  axillary supraclavicular or infraclavicular adenopathy no breast tenderness or nipple discharge. (exam performed in the presence of a chaperone)  LABORATORY DATA:  I have reviewed the data as listed CMP Latest Ref Rng & Units 07/19/2014 12/08/2013 10/08/2011  Glucose 70 - 140 mg/dl 95 104(H) -  BUN 7.0 - 26.0 mg/dL 22.8 17 -  Creatinine 0.6 - 1.1 mg/dL 0.8 0.67 -  Sodium 136 - 145 mEq/L 139 140 -  Potassium 3.5 - 5.1 mEq/L 4.7 4.7 -  Chloride 96 - 112 mEq/L - 102 -  CO2 22 - 29 mEq/L 24 27 -  Calcium 8.4 - 10.4 mg/dL 9.6 9.8 -  Total Protein 6.4 - 8.3 g/dL 7.3 - 7.1  Total Bilirubin 0.20 - 1.20 mg/dL 0.49 - 0.4  Alkaline Phos 40 - 150 U/L 54 - 46  AST 5 - 34 U/L 23 - 28  ALT 0 - 55 U/L 21 - 26    Lab Results  Component Value Date   WBC 5.0 07/19/2014   HGB 12.5 07/19/2014   HCT 37.8 07/19/2014   MCV 96.0 07/19/2014   PLT 370 07/19/2014   NEUTROABS 2.7 07/19/2014    ASSESSMENT & PLAN:  Neoplasm of left breast, 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 Dalbert Batman, exemestane started April 2015 switch to letrozole 01/28/2017  Letrozole toxicities:  1. Occasional hot flashes especially at night 2. Mild joint aches 3. Osteopenia T score -2.1: I encouraged the patient to take oral bisphosphonate therapy along with calcium and vitamin D as recommended by her primary care physician. I also encouraged her to continue weightbearing exercises.  Breast Cancer Surveillance: 1. Breast exam 02/12/2018: Benign 2. Mammogram  12/31/2017 at [facility] No abnormalities. Postsurgical changes. Breast Density Category B  Return to clinic in 1 year for follow-up March 2020 she will complete 5 years of therapy and then she can stop the treatment.  No orders of the defined types were placed in this encounter.  The patient has a good understanding of the overall plan. she agrees with it. she will call  with any problems that may develop before the next visit here.   Harriette Ohara, MD 02/12/18

## 2018-06-18 ENCOUNTER — Other Ambulatory Visit: Payer: Self-pay | Admitting: Psychiatry

## 2018-06-19 LAB — BASIC METABOLIC PANEL WITH GFR
BUN/Creatinine Ratio: 28 (calc) — ABNORMAL HIGH (ref 6–22)
BUN: 31 mg/dL — AB (ref 7–25)
CALCIUM: 10.6 mg/dL — AB (ref 8.6–10.4)
CHLORIDE: 103 mmol/L (ref 98–110)
CO2: 26 mmol/L (ref 20–32)
Creat: 1.1 mg/dL — ABNORMAL HIGH (ref 0.50–0.99)
GFR, EST AFRICAN AMERICAN: 61 mL/min/{1.73_m2} (ref >=60)
GFR, Est Non African American: 53 mL/min/{1.73_m2} — ABNORMAL LOW (ref >=60)
GLUCOSE: 95 mg/dL (ref 65–99)
POTASSIUM: 4.9 mmol/L (ref 3.5–5.3)
Sodium: 135 mmol/L (ref 135–146)

## 2018-06-19 LAB — TSH: TSH: 1.67 m[IU]/L (ref 0.40–4.50)

## 2018-06-19 LAB — LITHIUM LEVEL: Lithium Lvl: 1.1 mmol/L (ref 0.6–1.2)

## 2018-06-29 ENCOUNTER — Other Ambulatory Visit: Payer: Self-pay

## 2018-06-29 MED ORDER — LITHIUM CARBONATE 300 MG PO CAPS: 300.0000 mg | ORAL_CAPSULE | Freq: Every day | ORAL | 0 refills | Status: DC

## 2018-07-22 ENCOUNTER — Ambulatory Visit: Payer: No Typology Code available for payment source | Admitting: Psychiatry

## 2018-07-22 ENCOUNTER — Encounter: Payer: Self-pay | Admitting: Psychiatry

## 2018-07-22 DIAGNOSIS — Z79899 Other long term (current) drug therapy: Secondary | ICD-10-CM

## 2018-07-22 DIAGNOSIS — G251 Drug-induced tremor: Secondary | ICD-10-CM

## 2018-07-22 DIAGNOSIS — F39 Unspecified mood [affective] disorder: Secondary | ICD-10-CM | POA: Insufficient documentation

## 2018-07-22 MED ORDER — LITHIUM CARBONATE 150 MG PO CAPS: ORAL_CAPSULE | ORAL | 0 refills | Status: DC

## 2018-07-22 NOTE — Progress Notes (Signed)
Robin Barton [id number] 05/05/1956 62 y.o.  Subjective:   Patient ID:  Robin Barton is a 62 y.o. (DOB Jun 12, 1956) female.  Chief Complaint:  Chief Complaint  Patient presents with  . Medication Problem  . Follow-up    mood    HPI Robin Barton presents to the office today for follow-up of med changes and SE. Can't tell a difference with the reduction of paroxetine from 30 to 20mg .  Felt flat in summer but better since Sept.  She thinks it'srelated to the heat of the summer. Tremor has been horrible for the last few months until the last few days it stopped.    Not other changes in meds, etc to explain why tremor got better just recently.  Always taking AM B6.  Often forgets 2nd dose.  Taking lithium at night. Patient reports stable mood and denies depressed or irritable moods.  Patient denies any recent difficulty with anxiety except over the tremor.  Patient denies difficulty with sleep initiation or maintenance. Denies appetite disturbance.  Patient reports that energy and motivation have been good.  Patient denies any difficulty with concentration.  Patient denies any suicidal ideation.    Review of Systems:  Review of Systems  Medications: I have reviewed the patient's current medications.  Current Outpatient Medications  Medication Sig Dispense Refill  . anastrozole (ARIMIDEX) 1 MG tablet Take 1 tablet (1 mg total) by mouth daily. 90 tablet 3  . atenolol (TENORMIN) 25 MG tablet Take 1 tablet (25 mg total) by mouth daily. 30 tablet 2  . calcium carbonate (OS-CAL - DOSED IN MG OF ELEMENTAL CALCIUM) 1250 MG tablet Take 1 tablet by mouth daily.      Marland Kitchen lisinopril (PRINIVIL,ZESTRIL) 40 MG tablet Take 1 tablet (40 mg total) by mouth daily. 30 tablet 2  . lithium carbonate 300 MG capsule Take 1 capsule (300 mg total) by mouth daily with lunch. 90 capsule 0  . meloxicam (MOBIC) 15 MG tablet Take 15 mg by mouth daily.  0  . PARoxetine (PAXIL) 20 MG tablet Take  20 mg by mouth daily.    . Pyridoxine HCl (VITAMIN B-6) 500 MG tablet Take 500 mg by mouth daily.    . traZODone (DESYREL) 50 MG tablet Take 1 tablet (50 mg total) by mouth at bedtime. 30 tablet PRN  . alendronate (FOSAMAX) 70 MG tablet Reported on 02/06/2016  0  . methocarbamol (ROBAXIN) 500 MG tablet Take 1 tablet (500 mg total) 2 (two) times daily by mouth. (Patient not taking: Reported on 07/22/2018) 20 tablet 0  . naproxen (NAPROSYN) 250 MG tablet Take 1 tablet (250 mg total) 2 (two) times daily with a meal by mouth. (Patient not taking: Reported on 07/22/2018) 20 tablet 0   No current facility-administered medications for this visit.     Medication Side Effects: Other: tremor recently better  Allergies:  Allergies  Allergen Reactions  . Sulfonamide Derivatives Hives, Itching and Rash    Past Medical History:  Diagnosis Date  . Acid reflux   . Atypical lobular hyperplasia of left breast 11/25/13   lumpectomy, 12/10/13  . BCC (basal cell carcinoma)   . Depression   . Hyperlipidemia   . Hypertension   . Osteopenia   . Thyroid disease    rt thyroid nodule    Family History  Problem Relation Age of Onset  . Cancer Mother        colon-stage 4  . Hypertension Mother   . Colon cancer Mother   .  Breast cancer Sister   . Colon polyps Neg Hx   . Esophageal cancer Neg Hx   . Rectal cancer Neg Hx   . Stomach cancer Neg Hx     Social History   Socioeconomic History  . Marital status: Married    Spouse name: Not on file  . Number of children: 2  . Years of education: Not on file  . Highest education level: Not on file  Occupational History  . Not on file  Social Needs  . Financial resource strain: Not on file  . Food insecurity:    Worry: Not on file    Inability: Not on file  . Transportation needs:    Medical: Not on file    Non-medical: Not on file  Tobacco Use  . Smoking status: Current Some Day Smoker    Packs/day: 0.50    Years: 35.00    Pack years: 17.50     Types: Cigarettes  . Smokeless tobacco: Never Used  Substance and Sexual Activity  . Alcohol use: Yes    Comment: 3 glasses of wine nightly  . Drug use: No  . Sexual activity: Yes    Partners: Male    Birth control/protection: Post-menopausal  Lifestyle  . Physical activity:    Days per week: Not on file    Minutes per session: Not on file  . Stress: Not on file  Relationships  . Social connections:    Talks on phone: Not on file    Gets together: Not on file    Attends religious service: Not on file    Active member of club or organization: Not on file    Attends meetings of clubs or organizations: Not on file    Relationship status: Not on file  . Intimate partner violence:    Fear of current or ex partner: Not on file    Emotionally abused: Not on file    Physically abused: Not on file    Forced sexual activity: Not on file  Other Topics Concern  . Not on file  Social History Narrative  . Not on file    Past Medical History, Surgical history, Social history, and Family history were reviewed and updated as appropriate.   Please see review of systems for further details on the patient's review from today.   Objective:   Physical Exam:  LMP 09/24/1999   Physical Exam  Lab Review:     Component Value Date/Time   NA 135 06/18/2018 0934   NA 139 07/19/2014 0903   K 4.9 06/18/2018 0934   K 4.7 07/19/2014 0903   CL 103 06/18/2018 0934   CO2 26 06/18/2018 0934   CO2 24 07/19/2014 0903   GLUCOSE 95 06/18/2018 0934   GLUCOSE 95 07/19/2014 0903   BUN 31 (H) 06/18/2018 0934   BUN 22.8 07/19/2014 0903   CREATININE 1.10 (H) 06/18/2018 0934   CREATININE 0.8 07/19/2014 0903   CALCIUM 10.6 (H) 06/18/2018 0934   CALCIUM 9.6 07/19/2014 0903   PROT 7.3 07/19/2014 0903   ALBUMIN 4.1 07/19/2014 0903   AST 23 07/19/2014 0903   ALT 21 07/19/2014 0903   ALKPHOS 54 07/19/2014 0903   BILITOT 0.49 07/19/2014 0903   GFRNONAA 53 (L) 06/18/2018 0934   GFRAA 61 06/18/2018  0934       Component Value Date/Time   WBC 5.0 07/19/2014 0903   WBC 5.3 04/05/2011 0923   RBC 3.94 07/19/2014 0903   RBC 4.05 04/05/2011 6659  HGB 12.5 07/19/2014 0903   HCT 37.8 07/19/2014 0903   PLT 370 07/19/2014 0903   MCV 96.0 07/19/2014 0903   MCH 31.8 07/19/2014 0903   MCH 31.9 04/05/2011 0923   MCHC 33.1 07/19/2014 0903   MCHC 33.2 04/05/2011 0923   RDW 12.8 07/19/2014 0903   LYMPHSABS 1.5 07/19/2014 0903   MONOABS 0.6 07/19/2014 0903   EOSABS 0.1 07/19/2014 0903   BASOSABS 0.0 07/19/2014 0903    Lithium Lvl  Date Value Ref Range Status  06/18/2018 1.1 0.6 - 1.2 mmol/L Final     No results found for: PHENYTOIN, PHENOBARB, VALPROATE, CBMZ   .res Assessment: Plan:    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  Greater than 50% of face to face time with patient was spent on counseling and coordination of care. We discussed her history of response to lithium at the lower dose 150 and then the 300 mg a day.  We discussed her side effects of tremor.  We discussed drug to drug interactions that may be elevating her lithium level.  Her lithium level is higher than would be expected on the current dose.  It may also be higher than is necessary.  Mood wise she is had a good response to the medication.  She saw no difficulties from reducing the paroxetine.  We discussed the question of her diagnosis given at prior history of 2 weeks of hypomania but that may have been related to medications.  The uncertainty whether she truly has major depression versus bipolar disorder remains.  We discussed the risk of antidepressants causing mood cycling and the advantage to being on a lower dose.  We discussed her laboratory abnormalities including a creatinine and calcium of 10.6 and the risk of lithium causing either of these problems.  Therefore we will reduce the lithium to 300 mg on even days of  the month and 150 mg on odd days of the month.  In 2 weeks we will repeat the laboratory tests.  Follow-up 3 months  This appointment was 30 minutes.  Lynder Parents MD, DFAPA Future Appointments  Date Time Provider [place]  01/26/2019  9:00 AM Megan Salon, MD [facility] None  02/11/2019 10:15 AM Nicholas Lose, MD [facility] None    No orders of the defined types were placed in this encounter.     -------------------------------

## 2018-07-22 NOTE — Patient Instructions (Signed)
Get the blood test in the middle of November and go on a morning.  No fasting required

## 2018-10-05 ENCOUNTER — Other Ambulatory Visit: Payer: Self-pay

## 2018-10-05 MED ORDER — PAROXETINE HCL 20 MG PO TABS: 20.0000 mg | ORAL_TABLET | Freq: Every day | ORAL | 0 refills | Status: DC

## 2018-10-21 ENCOUNTER — Encounter: Payer: Self-pay | Admitting: Psychiatry

## 2018-10-21 ENCOUNTER — Ambulatory Visit: Payer: No Typology Code available for payment source | Admitting: Psychiatry

## 2018-10-21 DIAGNOSIS — G251 Drug-induced tremor: Secondary | ICD-10-CM

## 2018-10-21 DIAGNOSIS — F39 Unspecified mood [affective] disorder: Secondary | ICD-10-CM

## 2018-10-21 NOTE — Progress Notes (Signed)
Robin Barton [id number] 1956-08-09 63 y.o.  Subjective:   Patient ID:  Robin Barton is a 63 y.o. (DOB 1956-04-08) female.  Chief Complaint:  Chief Complaint  Patient presents with  . Follow-up    Medication Management  . Tremors    HPI last seen October 30 [name] presents to the office today for follow-up mood and anxiety.  Forgot to get labs after reducing the lithium.  Tremor is much better and manageable with the reduction of lithium.  No worsening of mood with the change.  Can't tell a difference with the reduction of paroxetine from 30 to 20mg .  Felt flat in summer but better since Sept.  She thinks it'srelated to the heat of the summer. Tremor has been horrible for the last few months until reducing the lithium..    Not other changes in meds, etc to explain why tremor got better just recently.  Always taking AM B6.  Often forgets 2nd dose.  Taking lithium at night. Patient reports stable mood and denies depressed or irritable moods.  Patient denies any recent difficulty with anxiety except over the tremor.  Patient denies difficulty with sleep initiation or maintenance. Denies appetite disturbance.  Patient reports that energy and motivation have been good.  Patient denies any difficulty with concentration.  Patient denies any suicidal ideation.    Review of Systems:  Review of Systems  Neurological: Positive for tremors. Negative for weakness.  Psychiatric/Behavioral: Negative for agitation, behavioral problems, confusion, decreased concentration, dysphoric mood, hallucinations, self-injury, sleep disturbance and suicidal ideas. The patient is not nervous/anxious and is not hyperactive.     Medications: I have reviewed the patient's current medications.  Current Outpatient Medications  Medication Sig Dispense Refill  . alendronate (FOSAMAX) 70 MG tablet Reported on 02/06/2016  0  . anastrozole (ARIMIDEX) 1 MG tablet Take 1 tablet (1 mg total)  by mouth daily. 90 tablet 3  . atenolol (TENORMIN) 25 MG tablet Take 1 tablet (25 mg total) by mouth daily. 30 tablet 2  . calcium carbonate (OS-CAL - DOSED IN MG OF ELEMENTAL CALCIUM) 1250 MG tablet Take 1 tablet by mouth daily.      Marland Kitchen lisinopril (PRINIVIL,ZESTRIL) 40 MG tablet Take 1 tablet (40 mg total) by mouth daily. 30 tablet 2  . lithium carbonate 150 MG capsule 2 capsules even days and 1 capsule odd days 135 capsule 0  . meloxicam (MOBIC) 15 MG tablet Take 15 mg by mouth daily.  0  . methocarbamol (ROBAXIN) 500 MG tablet Take 1 tablet (500 mg total) 2 (two) times daily by mouth. 20 tablet 0  . naproxen (NAPROSYN) 250 MG tablet Take 1 tablet (250 mg total) 2 (two) times daily with a meal by mouth. 20 tablet 0  . PARoxetine (PAXIL) 20 MG tablet Take 1 tablet (20 mg total) by mouth daily. 90 tablet 0  . Pyridoxine HCl (VITAMIN B-6) 500 MG tablet Take 500 mg by mouth daily.    . traZODone (DESYREL) 50 MG tablet Take 1 tablet (50 mg total) by mouth at bedtime. 30 tablet PRN   No current facility-administered medications for this visit.     Medication Side Effects: Other: tremor recently better  Allergies:  Allergies  Allergen Reactions  . Sulfonamide Derivatives Hives, Itching and Rash    Past Medical History:  Diagnosis Date  . Acid reflux   . Atypical lobular hyperplasia of left breast 11/25/13   lumpectomy, 12/10/13  . BCC (basal cell carcinoma)   .  Depression   . Hyperlipidemia   . Hypertension   . Osteopenia   . Thyroid disease    rt thyroid nodule    Family History  Problem Relation Age of Onset  . Cancer Mother        colon-stage 4  . Hypertension Mother   . Colon cancer Mother   . Breast cancer Sister   . Colon polyps Neg Hx   . Esophageal cancer Neg Hx   . Rectal cancer Neg Hx   . Stomach cancer Neg Hx     Social History   Socioeconomic History  . Marital status: Married    Spouse name: Not on file  . Number of children: 2  . Years of education: Not on  file  . Highest education level: Not on file  Occupational History  . Not on file  Social Needs  . Financial resource strain: Not on file  . Food insecurity:    Worry: Not on file    Inability: Not on file  . Transportation needs:    Medical: Not on file    Non-medical: Not on file  Tobacco Use  . Smoking status: Current Some Day Smoker    Packs/day: 0.50    Years: 35.00    Pack years: 17.50    Types: Cigarettes  . Smokeless tobacco: Never Used  Substance and Sexual Activity  . Alcohol use: Yes    Comment: 3 glasses of wine nightly  . Drug use: No  . Sexual activity: Yes    Partners: Male    Birth control/protection: Post-menopausal  Lifestyle  . Physical activity:    Days per week: Not on file    Minutes per session: Not on file  . Stress: Not on file  Relationships  . Social connections:    Talks on phone: Not on file    Gets together: Not on file    Attends religious service: Not on file    Active member of club or organization: Not on file    Attends meetings of clubs or organizations: Not on file    Relationship status: Not on file  . Intimate partner violence:    Fear of current or ex partner: Not on file    Emotionally abused: Not on file    Physically abused: Not on file    Forced sexual activity: Not on file  Other Topics Concern  . Not on file  Social History Narrative  . Not on file    Past Medical History, Surgical history, Social history, and Family history were reviewed and updated as appropriate.   Please see review of systems for further details on the patient's review from today.   Objective:   Physical Exam:  LMP 09/24/1999   Physical Exam Constitutional:      General: She is not in acute distress.    Appearance: She is well-developed.  Musculoskeletal:        General: No deformity.  Neurological:     Mental Status: She is alert and oriented to person, place, and time.     Motor: Tremor present.     Coordination: Coordination  normal.     Gait: Gait normal.  Psychiatric:        Attention and Perception: Attention and perception normal. She is attentive.        Mood and Affect: Mood normal. Mood is not anxious or depressed. Affect is not labile, blunt, angry or inappropriate.        Speech: Speech  normal.        Behavior: Behavior normal.        Thought Content: Thought content normal. Thought content does not include homicidal or suicidal ideation. Thought content does not include homicidal or suicidal plan.        Cognition and Memory: Cognition normal.        Judgment: Judgment normal.     Comments: Insight is good.     Lab Review:     Component Value Date/Time   NA 135 06/18/2018 0934   NA 139 07/19/2014 0903   K 4.9 06/18/2018 0934   K 4.7 07/19/2014 0903   CL 103 06/18/2018 0934   CO2 26 06/18/2018 0934   CO2 24 07/19/2014 0903   GLUCOSE 95 06/18/2018 0934   GLUCOSE 95 07/19/2014 0903   BUN 31 (H) 06/18/2018 0934   BUN 22.8 07/19/2014 0903   CREATININE 1.10 (H) 06/18/2018 0934   CREATININE 0.8 07/19/2014 0903   CALCIUM 10.6 (H) 06/18/2018 0934   CALCIUM 9.6 07/19/2014 0903   PROT 7.3 07/19/2014 0903   ALBUMIN 4.1 07/19/2014 0903   AST 23 07/19/2014 0903   ALT 21 07/19/2014 0903   ALKPHOS 54 07/19/2014 0903   BILITOT 0.49 07/19/2014 0903   GFRNONAA 53 (L) 06/18/2018 0934   GFRAA 61 06/18/2018 0934       Component Value Date/Time   WBC 5.0 07/19/2014 0903   WBC 5.3 04/05/2011 0923   RBC 3.94 07/19/2014 0903   RBC 4.05 04/05/2011 0923   HGB 12.5 07/19/2014 0903   HCT 37.8 07/19/2014 0903   PLT 370 07/19/2014 0903   MCV 96.0 07/19/2014 0903   MCH 31.8 07/19/2014 0903   MCH 31.9 04/05/2011 0923   MCHC 33.1 07/19/2014 0903   MCHC 33.2 04/05/2011 0923   RDW 12.8 07/19/2014 0903   LYMPHSABS 1.5 07/19/2014 0903   MONOABS 0.6 07/19/2014 0903   EOSABS 0.1 07/19/2014 0903   BASOSABS 0.0 07/19/2014 0903    Lithium Lvl  Date Value Ref Range Status  06/18/2018 1.1 0.6 - 1.2 mmol/L  Final     No results found for: PHENYTOIN, PHENOBARB, VALPROATE, CBMZ   .res Assessment: Plan:    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  Greater than 50% of face to face time with patient was spent on counseling and coordination of care. We discussed her history of response to lithium at the lower dose 150 and then the 300 mg a day.  We discussed her side effects of tremor.  We discussed drug to drug interactions that may be elevating her lithium level.  The tremor is better with the reduction to the current dose of lithium and the use of B6.  It is now manageable.  She forgot to get the lithium level.  Mood wise she is had a good response to the medication.  She saw no difficulties from reducing the paroxetine.  We discussed the question of her diagnosis given at prior history of 2 weeks of hypomania but that may have been related to medications.  The uncertainty whether she truly has major depression versus bipolar disorder remains.  We discussed the risk of antidepressants causing mood cycling and the advantage to being on a lower dose.  She needs to repeat the lithium level and the BMP because of some concerns about the calcium as well.  Therefore we will continue the lithium to 300 mg on  even days of the month and 150 mg on odd days of the month.    Follow-up 4 months   Lynder Parents MD, DFAPA Future Appointments  Date Time Provider [place]  01/26/2019  9:00 AM Megan Salon, MD [place] None  02/11/2019 10:15 AM Nicholas Lose, MD [facility] None  02/17/2019 10:30 AM Cottle, Billey Co., MD [facility] None    No orders of the defined types were placed in this encounter.     -------------------------------

## 2018-12-15 ENCOUNTER — Other Ambulatory Visit: Payer: Self-pay | Admitting: Psychiatry

## 2019-01-04 ENCOUNTER — Other Ambulatory Visit: Payer: Self-pay | Admitting: Psychiatry

## 2019-01-26 ENCOUNTER — Ambulatory Visit: Payer: No Typology Code available for payment source | Admitting: Obstetrics & Gynecology

## 2019-02-04 NOTE — Assessment & Plan Note (Signed)
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 Dalbert Batman, exemestane started April 2015 switch to letrozole 01/28/2017  Letrozole toxicities:  1. Occasional hot flashes especially at night 2. Mild joint aches 3. Osteopenia T score -2.1: I encouraged the patient to take oral bisphosphonate therapy along with calcium and vitamin D as recommended by her primary care physician. I also encouraged her to continue weightbearing exercises.  She completed 5 years of therapy and hence she can stop the letrozole.  Breast Cancer Surveillance: 1. Breast exam 02/12/2018: Benign 2. Mammogram 12/31/2017 at [facility] ([facility]) abnormalities. Postsurgical changes. Breast Density Category B  Return to clinic in 1 year for follow-up

## 2019-02-09 ENCOUNTER — Telehealth: Payer: Self-pay | Admitting: Hematology and Oncology

## 2019-02-09 NOTE — Telephone Encounter (Signed)
Spoke with pt and she agrees to convert 02/11/19 office visit into a Doximity call.  Pt aware to be near her cell phone prior to appt time.

## 2019-02-10 NOTE — Progress Notes (Signed)
HEMATOLOGY-ONCOLOGY DOXIMITY VISIT PROGRESS NOTE  I connected with Seward Grater on 02/11/2019 at 10:15 AM EDT by Doximity video conference and verified that I am speaking with the correct person using two identifiers.  I discussed the limitations, risks, security and privacy concerns of performing an evaluation and management service by Doximity and the availability of in person appointments.  I also discussed with the patient that there may be a patient responsible charge related to this service. The patient expressed understanding and agreed to proceed.  Patient's Location: Home Physician Location: Clinic  CHIEF COMPLIANT: Follow-up of LCIS on letrozole therapy  INTERVAL HISTORY: Robin Barton is a 63 y.o. female with above-mentioned history of LCIS who is currently on risk reduction therapy with letrozole. I last saw her a year ago. She presents over Doximity for annual follow-up.  Hot flashes have subsided.  She does not have any significant aches and pains since she stopped simvastatin.  She denies any lumps or nodules in the breast.  Her mammogram was delayed because of coronavirus.  She will call and set that appointment up.  REVIEW OF SYSTEMS:   Constitutional: Denies fevers, chills or abnormal weight loss Eyes: Denies blurriness of vision Ears, nose, mouth, throat, and face: Denies mucositis or sore throat Respiratory: Denies cough, dyspnea or wheezes Cardiovascular: Denies palpitation, chest discomfort Gastrointestinal:  Denies nausea, heartburn or change in bowel habits Skin: Denies abnormal skin rashes Lymphatics: Denies new lymphadenopathy or easy bruising Neurological:Denies numbness, tingling or new weaknesses Behavioral/Psych: Mood is stable, no new changes  Extremities: No lower extremity edema Breast: denies any pain or lumps or nodules in either breasts All other systems were reviewed with the patient and are negative.  Observations/Objective:  There  were no vitals filed for this visit. There is no height or weight on file to calculate BMI.  I have reviewed the data as listed CMP Latest Ref Rng & Units 06/18/2018 07/19/2014 12/08/2013  Glucose 65 - 99 mg/dL 95 95 104(H)  BUN 7 - 25 mg/dL 31(H) 22.8 17  Creatinine 0.50 - 0.99 mg/dL 1.10(H) 0.8 0.67  Sodium 135 - 146 mmol/L 135 139 140  Potassium 3.5 - 5.3 mmol/L 4.9 4.7 4.7  Chloride 98 - 110 mmol/L 103 - 102  CO2 20 - 32 mmol/L 26 24 27   Calcium 8.6 - 10.4 mg/dL 10.6(H) 9.6 9.8  Total Protein 6.4 - 8.3 g/dL - 7.3 -  Total Bilirubin 0.20 - 1.20 mg/dL - 0.49 -  Alkaline Phos 40 - 150 U/L - 54 -  AST 5 - 34 U/L - 23 -  ALT 0 - 55 U/L - 21 -    Lab Results  Component Value Date   WBC 5.0 07/19/2014   HGB 12.5 07/19/2014   HCT 37.8 07/19/2014   MCV 96.0 07/19/2014   PLT 370 07/19/2014   NEUTROABS 2.7 07/19/2014      Assessment Plan:  Neoplasm of left breast, 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 Dalbert Batman, exemestane started April 2015 switch to letrozole 01/28/2017  Letrozole toxicities:  1. Occasional hot flashes: Improved 2. Mild joint aches Prob due to simvastatin 3. Osteopenia T score -2.1: I encouraged the patient to take oral bisphosphonate therapy along with calcium and vitamin D as recommended by her primary care physician. I also encouraged her to continue weightbearing exercises.  She completed 5 years of therapy and hence she can stop the  letrozole.  Breast Cancer Surveillance: 1. Breast exam 02/12/2018: Benign 2. Mamm[facility] 12/31/2017 at [facility] abnormalities. Postsurgical changes. Breast Density Category B. It was postponed this year due to corona virus  Return to clinic in 1 year for follow-up with long-term survivorship.  I discussed the assessment and treatment plan with the patient. The patient was provided an opportunity to ask questions and all  were answered. The patient agreed with the plan and demonstrated an understanding of the instructions. The patient was advised to call back or seek an in-person evaluation if the symptoms worsen or if the condition fails to improve as anticipated.   I provided 15 minutes of face-to-face Doximity time during this encounter.    Rulon Eisenmenger, MD 02/11/2019   I, Molly Dorshimer, am acting as scribe for Nicholas Lose, MD.  I have reviewed the above documentation for accuracy and completeness, and I agree with the above.

## 2019-02-11 ENCOUNTER — Telehealth: Payer: Self-pay | Admitting: Hematology and Oncology

## 2019-02-11 ENCOUNTER — Inpatient Hospital Stay: Payer: Medicare Other | Attending: Hematology and Oncology | Admitting: Hematology and Oncology

## 2019-02-11 DIAGNOSIS — D0502 Lobular carcinoma in situ of left breast: Secondary | ICD-10-CM

## 2019-02-11 DIAGNOSIS — Z79811 Long term (current) use of aromatase inhibitors: Secondary | ICD-10-CM | POA: Diagnosis not present

## 2019-02-11 DIAGNOSIS — Z170 Estrogen receptor positive status [ER+]: Secondary | ICD-10-CM

## 2019-02-15 ENCOUNTER — Encounter: Payer: Self-pay | Admitting: Gastroenterology

## 2019-02-16 ENCOUNTER — Other Ambulatory Visit: Payer: Self-pay

## 2019-02-17 ENCOUNTER — Ambulatory Visit: Payer: No Typology Code available for payment source | Admitting: Psychiatry

## 2019-02-18 ENCOUNTER — Other Ambulatory Visit: Payer: Self-pay

## 2019-02-18 ENCOUNTER — Ambulatory Visit: Payer: Medicare Other | Admitting: Psychiatry

## 2019-02-18 ENCOUNTER — Ambulatory Visit: Payer: Medicare Other | Admitting: Obstetrics & Gynecology

## 2019-02-18 ENCOUNTER — Encounter: Payer: Self-pay | Admitting: Psychiatry

## 2019-02-18 ENCOUNTER — Ambulatory Visit (INDEPENDENT_AMBULATORY_CARE_PROVIDER_SITE_OTHER): Payer: Medicare Other | Admitting: Obstetrics & Gynecology

## 2019-02-18 ENCOUNTER — Encounter: Payer: Self-pay | Admitting: Obstetrics & Gynecology

## 2019-02-18 ENCOUNTER — Other Ambulatory Visit (HOSPITAL_COMMUNITY)
Admission: RE | Admit: 2019-02-18 | Discharge: 2019-02-18 | Disposition: A | Discharge status: Home / Self Care | Payer: Medicare Other | Source: Ambulatory Visit | Attending: Obstetrics & Gynecology | Admitting: Obstetrics & Gynecology

## 2019-02-18 VITALS — BP 132/80 | HR 78 | Temp 98.1°F | Ht 60.75 in | Wt 120.0 lb

## 2019-02-18 DIAGNOSIS — F39 Unspecified mood [affective] disorder: Secondary | ICD-10-CM

## 2019-02-18 DIAGNOSIS — G251 Drug-induced tremor: Secondary | ICD-10-CM

## 2019-02-18 DIAGNOSIS — Z23 Encounter for immunization: Secondary | ICD-10-CM | POA: Diagnosis not present

## 2019-02-18 DIAGNOSIS — Z01419 Encounter for gynecological examination (general) (routine) without abnormal findings: Secondary | ICD-10-CM | POA: Diagnosis not present

## 2019-02-18 DIAGNOSIS — Z124 Encounter for screening for malignant neoplasm of cervix: Secondary | ICD-10-CM | POA: Diagnosis present

## 2019-02-18 DIAGNOSIS — Z79899 Other long term (current) drug therapy: Secondary | ICD-10-CM

## 2019-02-18 NOTE — Progress Notes (Signed)
Robin Barton [id number] November 11, 1955 63 y.o.   Virtual Visit via Telephone Note  I connected with pt by telephone and verified that I am speaking with the correct person using two identifiers.   I discussed the limitations, risks, security and privacy concerns of performing an evaluation and management service by telephone and the availability of in person appointments. I also discussed with the patient that there may be a patient responsible charge related to this service. The patient expressed understanding and agreed to proceed.  I discussed the assessment and treatment plan with the patient. The patient was provided an opportunity to ask questions and all were answered. The patient agreed with the plan and demonstrated an understanding of the instructions.   The patient was advised to call back or seek an in-person evaluation if the symptoms worsen or if the condition fails to improve as anticipated.  I provided 15 minutes of non-face-to-face time during this encounter. The call started at 1030 and ended at 1045. The patient was located at home and the provider was located office.   Subjective:   Patient ID:  Robin Barton is a 63 y.o. (DOB 11-16-1955) female.  Chief Complaint:  Chief Complaint  Patient presents with  . Follow-up    Medication Management   . Depression    Medication Management     Depression         Associated symptoms include myalgias.  Associated symptoms include no decreased concentration and no suicidal ideas.  Robin Barton presents to the office today for follow-up mood and anxiety.  Last visit October 21, 2018.  She was doing pretty well and no meds were changed.  Doing well in spite of stress of Covid.  Mood has been stable and likes the combo of paroxetine and lithium.  Didn't get labs yet DT Covid.  Forgot to get labs after reducing the lithium.  Tremor is much better and manageable with the reduction of lithium.  No worsening  of mood with the change.  Tremor has gotten better on it's own. Is compliant with lithium dosing and others except occ misses B6 witout tremor.     Patient reports stable mood and denies depressed or irritable moods.  Patient denies any recent difficulty with anxiety except over the tremor.  Patient denies difficulty with sleep initiation or maintenance with trazodone. Denies appetite disturbance.  Patient reports that energy and motivation have been good.  Patient denies any difficulty with concentration.  Patient denies any suicidal ideation.  Past Psychiatric Medication Trials:   Review of Systems:  Review of Systems  Musculoskeletal: Positive for myalgias.  Neurological: Positive for tremors. Negative for weakness.  Psychiatric/Behavioral: Positive for depression. Negative for agitation, behavioral problems, confusion, decreased concentration, dysphoric mood, hallucinations, self-injury, sleep disturbance and suicidal ideas. The patient is not nervous/anxious and is not hyperactive.     Medications: I have reviewed the patient's current medications.  Current Outpatient Medications  Medication Sig Dispense Refill  . alendronate (FOSAMAX) 70 MG tablet Reported on 02/06/2016  0  . anastrozole (ARIMIDEX) 1 MG tablet Take 1 tablet (1 mg total) by mouth daily. 90 tablet 3  . atenolol (TENORMIN) 25 MG tablet Take 1 tablet (25 mg total) by mouth daily. 30 tablet 2  . calcium carbonate (OS-CAL - DOSED IN MG OF ELEMENTAL CALCIUM) 1250 MG tablet Take 1 tablet by mouth daily.      Marland Kitchen lisinopril (PRINIVIL,ZESTRIL) 40 MG tablet Take 1 tablet (40 mg total) by mouth daily.  30 tablet 2  . lithium carbonate 150 MG capsule TAKE 2 CAPSULES BY MOUTH ON EVEN DAYS AND 1 CAPSULE BY MOUTH ON ODD DAYS 135 capsule 0  . meloxicam (MOBIC) 15 MG tablet Take 15 mg by mouth daily.  0  . PARoxetine (PAXIL) 20 MG tablet TAKE 1 TABLET(20 MG) BY MOUTH DAILY 90 tablet 0  . Pyridoxine HCl (VITAMIN B-6) 500 MG tablet Take  500 mg by mouth daily.    . traZODone (DESYREL) 50 MG tablet Take 1 tablet (50 mg total) by mouth at bedtime. 30 tablet PRN  . cyclobenzaprine (FLEXERIL) 10 MG tablet TK 1 T PO Q 8 H PRN FOR MUSCLE SPASMS     No current facility-administered medications for this visit.     Medication Side Effects: Other: tremor recently better  Allergies:  Allergies  Allergen Reactions  . Sulfonamide Derivatives Hives, Itching and Rash    Past Medical History:  Diagnosis Date  . Acid reflux   . Atypical lobular hyperplasia of left breast 11/25/13   lumpectomy, 12/10/13  . BCC (basal cell carcinoma)   . Depression   . Hyperlipidemia   . Hypertension   . Osteopenia   . Thyroid disease    rt thyroid nodule    Family History  Problem Relation Age of Onset  . Cancer Mother        colon-stage 4  . Hypertension Mother   . Colon cancer Mother   . Breast cancer Sister   . Colon polyps Neg Hx   . Esophageal cancer Neg Hx   . Rectal cancer Neg Hx   . Stomach cancer Neg Hx     Social History   Socioeconomic History  . Marital status: Married    Spouse name: Not on file  . Number of children: 2  . Years of education: Not on file  . Highest education level: Not on file  Occupational History  . Not on file  Social Needs  . Financial resource strain: Not on file  . Food insecurity:    Worry: Not on file    Inability: Not on file  . Transportation needs:    Medical: Not on file    Non-medical: Not on file  Tobacco Use  . Smoking status: Current Some Day Smoker    Packs/day: 0.50    Years: 35.00    Pack years: 17.50    Types: Cigarettes  . Smokeless tobacco: Never Used  Substance and Sexual Activity  . Alcohol use: Yes    Comment: 3 glasses of wine nightly  . Drug use: No  . Sexual activity: Yes    Partners: Male    Birth control/protection: Post-menopausal  Lifestyle  . Physical activity:    Days per week: Not on file    Minutes per session: Not on file  . Stress: Not on file   Relationships  . Social connections:    Talks on phone: Not on file    Gets together: Not on file    Attends religious service: Not on file    Active member of club or organization: Not on file    Attends meetings of clubs or organizations: Not on file    Relationship status: Not on file  . Intimate partner violence:    Fear of current or ex partner: Not on file    Emotionally abused: Not on file    Physically abused: Not on file    Forced sexual activity: Not on file  Other Topics  Concern  . Not on file  Social History Narrative  . Not on file    Past Medical History, Surgical history, Social history, and Family history were reviewed and updated as appropriate.   Please see review of systems for further details on the patient's review from today.   Objective:   Physical Exam:  LMP 09/24/1999   Physical Exam Neurological:     Mental Status: She is alert and oriented to person, place, and time.     Cranial Nerves: No dysarthria.  Psychiatric:        Attention and Perception: Attention normal.        Mood and Affect: Mood normal.        Speech: Speech normal.        Behavior: Behavior is cooperative.        Thought Content: Thought content normal. Thought content is not paranoid or delusional. Thought content does not include homicidal or suicidal ideation. Thought content does not include homicidal or suicidal plan.        Cognition and Memory: Cognition and memory normal.        Judgment: Judgment normal.     Comments: Insight good     Lab Review:     Component Value Date/Time   NA 135 06/18/2018 0934   NA 139 07/19/2014 0903   K 4.9 06/18/2018 0934   K 4.7 07/19/2014 0903   CL 103 06/18/2018 0934   CO2 26 06/18/2018 0934   CO2 24 07/19/2014 0903   GLUCOSE 95 06/18/2018 0934   GLUCOSE 95 07/19/2014 0903   BUN 31 (H) 06/18/2018 0934   BUN 22.8 07/19/2014 0903   CREATININE 1.10 (H) 06/18/2018 0934   CREATININE 0.8 07/19/2014 0903   CALCIUM 10.6 (H)  06/18/2018 0934   CALCIUM 9.6 07/19/2014 0903   PROT 7.3 07/19/2014 0903   ALBUMIN 4.1 07/19/2014 0903   AST 23 07/19/2014 0903   ALT 21 07/19/2014 0903   ALKPHOS 54 07/19/2014 0903   BILITOT 0.49 07/19/2014 0903   GFRNONAA 53 (L) 06/18/2018 0934   GFRAA 61 06/18/2018 0934       Component Value Date/Time   WBC 5.0 07/19/2014 0903   WBC 5.3 04/05/2011 0923   RBC 3.94 07/19/2014 0903   RBC 4.05 04/05/2011 0923   HGB 12.5 07/19/2014 0903   HCT 37.8 07/19/2014 0903   PLT 370 07/19/2014 0903   MCV 96.0 07/19/2014 0903   MCH 31.8 07/19/2014 0903   MCH 31.9 04/05/2011 0923   MCHC 33.1 07/19/2014 0903   MCHC 33.2 04/05/2011 0923   RDW 12.8 07/19/2014 0903   LYMPHSABS 1.5 07/19/2014 0903   MONOABS 0.6 07/19/2014 0903   EOSABS 0.1 07/19/2014 0903   BASOSABS 0.0 07/19/2014 0903    Lithium Lvl  Date Value Ref Range Status  06/18/2018 1.1 0.6 - 1.2 mmol/L Final     No results found for: PHENYTOIN, PHENOBARB, VALPROATE, CBMZ   .res Assessment: Plan:    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  Overall she remained stable after the reduction in both lithium and paroxetine to current dosages.  No indication for any changes.  We discussed her history of response to lithium at the lower dose 150 and then the 300 mg a day.  We discussed her side effects  of tremor.  It's better now. We discussed drug to drug interactions that may be elevating her lithium level.  The tremor is better with the reduction to the current dose of lithium and the use of B6.  It is now manageable.  Disc option stopping B6 to see if she still needs it.  We discussed the question of her diagnosis given at prior history of 2 weeks of hypomania but that may have been related to medications.   The uncertainty whether she truly has major depression versus bipolar disorder remains.  We discussed the risk of antidepressants causing mood cycling and the advantage to being on a lower dose.  She needs to repeat the lithium level and the BMP because of some concerns about the calcium as well.  No med changes today. Continue paroxetine 20 mg daily, continue trazodone 50 mg nightly, continue lithium 150 mg alternating with 300 mg every other day.  Follow-up 5 months  Lynder Parents MD, DFAPA Future Appointments  Date Time Provider [place]  02/18/2019 11:30 AM Megan Salon, MD [place] None  03/01/2019  2:30 PM [facility] PREVISIT [id number] [facility]  03/15/2019  1:00 PM Armbruster, Carlota Raspberry, MD [facility]  02/11/2020 11:00 AM Causey, Charlestine Massed, NP [facility] None    Orders Placed This Encounter  Procedures  . Lithium level  . Basic metabolic panel      -------------------------------

## 2019-02-18 NOTE — Progress Notes (Signed)
63 y.o. G77P0002 Married White or Caucasian female here for annual exam.  Has been doing fairly well.  Does have some increased anxiety due to Covid.  Seeing Dr. Clovis Pu regularly.  Denies vaginal bleeding.    Had virtual visit with Dr. Jackelyn Poling last week.  Cholesterol was good so has not restarted this.    Patient's last menstrual period was 09/24/1999.          Sexually active: Yes.    The current method of family planning is post menopausal status.    Exercising: Yes.    yoga, walking Smoker:  yes  Health Maintenance:  Pap:  08/20/16 neg. HR HPV:neg   06/15/14 Neg  History of abnormal Pap:  no MMG:  12/31/17 BIRADS1:neg. Had one 02/12/2019. Colonoscopy:  02/06/16 f/u 3 years. Has appt 03/15/2019. BMD:   01/03/15.  Does this with Dr. Jackelyn Poling.   TDaP:  2010 Pneumonia vaccine(s):  2016 Shingrix:   Completed  Hep C testing: PCP Screening Labs: PCP   reports that she has been smoking cigarettes. She has a 17.50 pack-year smoking history. She has never used smokeless tobacco. She reports current alcohol use of about 5.0 - 6.0 standard drinks of alcohol per week. She reports that she does not use drugs.  Past Medical History:  Diagnosis Date  . Acid reflux   . Atypical lobular hyperplasia of left breast 11/25/13   lumpectomy, 12/10/13  . BCC (basal cell carcinoma)   . Depression   . Hyperlipidemia   . Hypertension   . Osteopenia   . Thyroid disease    rt thyroid nodule    Past Surgical History:  Procedure Laterality Date  . BREAST LUMPECTOMY WITH RADIOACTIVE SEED LOCALIZATION Left 12/10/2013   Procedure: BREAST LUMPECTOMY WITH RADIOACTIVE SEED LOCALIZATION;  Surgeon: [name], MD;  Location: [facility];  Service: General;  Laterality: Left;  . BUNIONECTOMY  2/13 left foot and toe straightened  . BUNIONECTOMY  3/13 right foot and toe straightened  . COLONOSCOPY    . NASAL SEPTUM SURGERY  1982    Current Outpatient Medications  Medication Sig Dispense Refill   . alendronate (FOSAMAX) 70 MG tablet Reported on 02/06/2016  0  . anastrozole (ARIMIDEX) 1 MG tablet Take 1 tablet (1 mg total) by mouth daily. 90 tablet 3  . atenolol (TENORMIN) 25 MG tablet Take 1 tablet (25 mg total) by mouth daily. 30 tablet 2  . calcium carbonate (OS-CAL - DOSED IN MG OF ELEMENTAL CALCIUM) 1250 MG tablet Take 1 tablet by mouth daily.      . Cholecalciferol (VITAMIN D3) 1.25 MG (50000 UT) CAPS TK 1 C PO Q WK    . cyclobenzaprine (FLEXERIL) 10 MG tablet TK 1 T PO Q 8 H PRN FOR MUSCLE SPASMS    . lisinopril (PRINIVIL,ZESTRIL) 40 MG tablet Take 1 tablet (40 mg total) by mouth daily. 30 tablet 2  . lithium carbonate 150 MG capsule TAKE 2 CAPSULES BY MOUTH ON EVEN DAYS AND 1 CAPSULE BY MOUTH ON ODD DAYS 135 capsule 0  . PARoxetine (PAXIL) 20 MG tablet TAKE 1 TABLET(20 MG) BY MOUTH DAILY 90 tablet 0  . Pyridoxine HCl (VITAMIN B-6) 500 MG tablet Take 500 mg by mouth daily.    . traZODone (DESYREL) 50 MG tablet Take 1 tablet (50 mg total) by mouth at bedtime. 30 tablet PRN   No current facility-administered medications for this visit.     Family History  Problem Relation Age of Onset  .  Cancer Mother        colon-stage 4  . Hypertension Mother   . Colon cancer Mother   . Breast cancer Sister   . Colon polyps Neg Hx   . Esophageal cancer Neg Hx   . Rectal cancer Neg Hx   . Stomach cancer Neg Hx     Review of Systems  All other systems reviewed and are negative.   Exam:   BP 132/80   Pulse 78   Temp 98.1 F (36.7 C) (Temporal)   Ht 5' 0.75" (1.543 m)   Wt 120 lb (54.4 kg)   LMP 09/24/1999   BMI 22.86 kg/m    Height: 5' 0.75" (154.3 cm)  Ht Readings from Last 3 Encounters:  02/18/19 5' 0.75" (1.543 m)  02/12/18 5' 0.5" (1.537 m)  11/25/17 5' 0.5" (1.537 m)    General appearance: alert, cooperative and appears stated age Head: Normocephalic, without obvious abnormality, atraumatic Neck: no adenopathy, supple, symmetrical, trachea midline and thyroid normal  to inspection and palpation Lungs: clear to auscultation bilaterally Breasts: normal appearance, no masses or tenderness, well healed breast scar left breast Heart: regular rate and rhythm Abdomen: soft, non-tender; bowel sounds normal; no masses,  no organomegaly Extremities: extremities normal, atraumatic, no cyanosis or edema Skin: Skin color, texture, turgor normal. No rashes or lesions Lymph nodes: Cervical, supraclavicular, and axillary nodes normal. No abnormal inguinal nodes palpated Neurologic: Grossly normal  Pelvic: External genitalia:  no lesions              Urethra:  normal appearing urethra with no masses, tenderness or lesions              Bartholins and Skenes: normal                 Vagina: normal appearing vagina with normal color and discharge, no lesions              Cervix: no lesions              Pap taken: Yes.   Bimanual Exam:  Uterus:  normal size, contour, position, consistency, mobility, non-tender              Adnexa: normal adnexa and no mass, fullness, tenderness               Rectovaginal: Confirms               Anus:  normal sphincter tone, no lesions  Chaperone was present for exam.  A:  Well Woman with normal exam PMP, no HRT H/o skin BCC.  Followed by derm yearly. 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  On Aromasin x 5 years, started 9/15. Osteopenia.  On Fosamax. Family hx of colon cancer in mother (early 36's).  Person hx of adenomatous polyps Depression/anxiety  P:   Mammogram guidelines reviewed.  Doing 3D. pap smear obtained today Tdap updated today Colonoscopy every 5 years Pt will do BMD later this summer with Dr. Jackelyn Poling. return annually or prn

## 2019-02-19 LAB — CYTOLOGY - PAP: Diagnosis: NEGATIVE

## 2019-03-01 ENCOUNTER — Ambulatory Visit (AMBULATORY_SURGERY_CENTER): Payer: Self-pay

## 2019-03-01 ENCOUNTER — Other Ambulatory Visit: Payer: Self-pay

## 2019-03-01 VITALS — Ht 61.0 in | Wt 120.0 lb

## 2019-03-01 DIAGNOSIS — Z86010 Personal history of colonic polyps: Secondary | ICD-10-CM

## 2019-03-01 DIAGNOSIS — Z800 Family history of malignant neoplasm of digestive organs: Secondary | ICD-10-CM

## 2019-03-01 MED ORDER — NA SULFATE-K SULFATE-MG SULF 17.5-3.13-1.6 GM/177ML PO SOLN: 1.0000 | Freq: Once | ORAL | 0 refills | Status: AC

## 2019-03-01 NOTE — Progress Notes (Signed)
Per pt, no allergies to soy or egg products.Pt not taking any weight loss meds or using  O2 at home.  Pt refused emmi video.  Pt denies sedation problems.  The PV was done over the phone due to COVID-19. I verified pt's insurance and address. Reviewed prep instructions and medical hx with the pt and will mail paperwork to the pt today. Informed pt to call with any questions or changes prior to her procedure. She understood.

## 2019-03-05 ENCOUNTER — Encounter: Payer: Self-pay | Admitting: Gastroenterology

## 2019-03-12 ENCOUNTER — Telehealth: Payer: Self-pay | Admitting: Gastroenterology

## 2019-03-12 NOTE — Telephone Encounter (Signed)

## 2019-03-15 ENCOUNTER — Encounter: Payer: Self-pay | Admitting: Gastroenterology

## 2019-03-15 ENCOUNTER — Ambulatory Visit (AMBULATORY_SURGERY_CENTER): Payer: Medicare Other | Admitting: Gastroenterology

## 2019-03-15 ENCOUNTER — Other Ambulatory Visit: Payer: Self-pay

## 2019-03-15 VITALS — BP 112/67 | HR 58 | Temp 98.9°F | Resp 20 | Ht 61.0 in | Wt 120.0 lb

## 2019-03-15 DIAGNOSIS — Z800 Family history of malignant neoplasm of digestive organs: Secondary | ICD-10-CM | POA: Diagnosis not present

## 2019-03-15 DIAGNOSIS — D123 Benign neoplasm of transverse colon: Secondary | ICD-10-CM | POA: Diagnosis not present

## 2019-03-15 DIAGNOSIS — Z86010 Personal history of colonic polyps: Secondary | ICD-10-CM

## 2019-03-15 DIAGNOSIS — K635 Polyp of colon: Secondary | ICD-10-CM

## 2019-03-15 MED ORDER — SODIUM CHLORIDE 0.9 % IV SOLN: 500.0000 mL | Freq: Once | INTRAVENOUS | Status: DC

## 2019-03-15 NOTE — Progress Notes (Signed)
Robin Barton- temp Robin Barton- vitals 

## 2019-03-15 NOTE — Progress Notes (Signed)
Pt's states no medical or surgical changes since previsit or office visit. 

## 2019-03-15 NOTE — Progress Notes (Signed)
Called to room to assist during endoscopic procedure.  Patient ID and intended procedure confirmed with present staff. Received instructions for my participation in the procedure from the performing physician.  

## 2019-03-15 NOTE — Op Note (Signed)
[place] Patient Name: Mardell Suttles Procedure Date: 03/15/2019 12:52 PM MRN: [id number] Endoscopist: Remo Lipps P. Havery Moros , MD Age: 63 Referring MD:  Date of Birth: 06/04/1956 Gender: Female Account #: [id number] Procedure:                Colonoscopy Indications:              Surveillance: Personal history of adenomatous                            polyps on last colonoscopy 3 years ago, mother                            diagnosed with colon cancer age 65s Medicines:                Monitored Anesthesia Care Procedure:                Pre-Anesthesia Assessment:                           - Prior to the procedure, a History and Physical                            was performed, and patient medications and                            allergies were reviewed. The patient's tolerance of                            previous anesthesia was also reviewed. The risks                            and benefits of the procedure and the sedation                            options and risks were discussed with the patient.                            All questions were answered, and informed consent                            was obtained. Prior Anticoagulants: The patient has                            taken no previous anticoagulant or antiplatelet                            agents. ASA Grade Assessment: II - A patient with                            mild systemic disease. After reviewing the risks                            and benefits, the patient was deemed in  satisfactory condition to undergo the procedure.                           After obtaining informed consent, the colonoscope                            was passed under direct vision. Throughout the                            procedure, the patient's blood pressure, pulse, and                            oxygen saturations were monitored continuously. The                            Colonoscope was  introduced through the anus and                            advanced to the the cecum, identified by                            appendiceal orifice and ileocecal valve. The                            colonoscopy was performed without difficulty. The                            patient tolerated the procedure well. The quality                            of the bowel preparation was adequate. The                            ileocecal valve, appendiceal orifice, and rectum                            were photographed. Scope In: 1:03:44 PM Scope Out: 1:28:01 PM Scope Withdrawal Time: 0 hours 14 minutes 59 seconds  Total Procedure Duration: 0 hours 24 minutes 17 seconds  Findings:                 The perianal and digital rectal examinations were                            normal.                           A diminutive polyp was found in the transverse                            colon. The polyp was sessile. The polyp was removed                            with a cold snare. Resection and retrieval were  complete.                           A diminutive polyp was found in the hepatic                            flexure. The polyp was sessile. The polyp was                            removed with a cold snare. Resection and retrieval                            were complete.                           The colon was tortuous.                           The exam was otherwise without abnormality. Complications:            No immediate complications. Estimated blood loss:                            Minimal. Estimated Blood Loss:     Estimated blood loss was minimal. Impression:               - One diminutive polyp in the transverse colon,                            removed with a cold snare. Resected and retrieved.                           - One diminutive polyp at the hepatic flexure,                            removed with a cold snare. Resected and retrieved.                            - Tortuous colon.                           - The examination was otherwise normal. Recommendation:           - Patient has a contact number available for                            emergencies. The signs and symptoms of potential                            delayed complications were discussed with the                            patient. Return to normal activities tomorrow.                            Written discharge instructions were provided to the  patient.                           - Resume previous diet.                           - Continue present medications.                           - Await pathology results. Remo Lipps P. , MD 03/15/2019 1:32:15 PM This report has been signed electronically.

## 2019-03-15 NOTE — Progress Notes (Signed)
To PACU, VSS. Report to [email or web address] 

## 2019-03-15 NOTE — Patient Instructions (Signed)
2 polyps removed and sent to lab for analysis.  Dr Havery Moros will mail you an explanation of results in 1-3 weeks.  If you do not receive a letter from him in the mail, please call the office and we will give you a report over telephone.     YOU HAD AN ENDOSCOPIC PROCEDURE TODAY AT [facility]:   Refer to the procedure report that was given to you for any specific questions about what was found during the examination.  If the procedure report does not answer your questions, please call your gastroenterologist to clarify.  If you requested that your care partner not be given the details of your procedure findings, then the procedure report has been included in a sealed envelope for you to review at your convenience later.  YOU SHOULD EXPECT: Some feelings of bloating in the abdomen. Passage of more gas than usual.  Walking can help get rid of the air that was put into your GI tract during the procedure and reduce the bloating. If you had a lower endoscopy (such as a colonoscopy or flexible sigmoidoscopy) you may notice spotting of blood in your stool or on the toilet paper. If you underwent a bowel prep for your procedure, you may not have a normal bowel movement for a few days.  Please Note:  You might notice some irritation and congestion in your nose or some drainage.  This is from the oxygen used during your procedure.  There is no need for concern and it should clear up in a day or so.  SYMPTOMS TO REPORT IMMEDIATELY:   Following lower endoscopy (colonoscopy or flexible sigmoidoscopy):  Excessive amounts of blood in the stool  Significant tenderness or worsening of abdominal pains  Swelling of the abdomen that is new, acute  Fever of 100F or higher   For urgent or emergent issues, a gastroenterologist can be reached at any hour by calling [phone].   DIET:  We do recommend a small meal at first, but then you may proceed to your regular diet.  Drink plenty of  fluids but you should avoid alcoholic beverages for 24 hours.  ACTIVITY:  You should plan to take it easy for the rest of today and you should NOT DRIVE or use heavy machinery until tomorrow (because of the sedation medicines used during the test).    FOLLOW UP: Our staff will call the number listed on your records 48-72 hours following your procedure to check on you and address any questions or concerns that you may have regarding the information given to you following your procedure. If we do not reach you, we will leave a message.  We will attempt to reach you two times.  During this call, we will ask if you have developed any symptoms of COVID 19. If you develop any symptoms (ie: fever, flu-like symptoms, shortness of breath, cough etc.) before then, please call [phone].  If you test positive for Covid 19 in the 2 weeks post procedure, please call and report this information to [place].    If any biopsies were taken you will be contacted by phone or by letter within the next 1-3 weeks.  Please call us at [phone] if you have not heard about the biopsies in 3 weeks.    SIGNATURES/CONFIDENTIALITY: You and/or your care partner have signed paperwork which will be entered into your electronic medical record.  These signatures attest to the fact that that the information above on your  After Visit Summary has been reviewed and is understood.  Full responsibility of the confidentiality of this discharge information lies with you and/or your care-partner.

## 2019-03-17 ENCOUNTER — Telehealth: Payer: Self-pay | Admitting: *Deleted

## 2019-03-17 NOTE — Telephone Encounter (Signed)
1. Have you developed a fever since your procedure? no  2.   Have you had an respiratory symptoms (SOB or cough) since your procedure? no  3.   Have you tested positive for COVID 19 since your procedure no  4.   Have you had any family members/close contacts diagnosed with the COVID 19 since your procedure?  no   If yes to any of these questions please route to Joylene John, RN and Alphonsa Gin, Therapist, sports.    Follow up Call-  Call back number 03/15/2019  Post procedure Call Back phone  # [phone]  Permission to leave phone message Yes  Some recent data might be hidden     Patient questions:  Do you have a fever, pain , or abdominal swelling? No. Pain Score  0 *  Have you tolerated food without any problems? Yes.    Have you been able to return to your normal activities? Yes.    Do you have any questions about your discharge instructions: Diet   No. Medications  No. Follow up visit  No.  Do you have questions or concerns about your Care? No.  Actions: * If pain score is 4 or above: No action needed, pain <4.

## 2019-03-21 ENCOUNTER — Encounter: Payer: Self-pay | Admitting: Gastroenterology

## 2019-04-01 ENCOUNTER — Other Ambulatory Visit: Payer: Self-pay | Admitting: Psychiatry

## 2019-04-14 ENCOUNTER — Other Ambulatory Visit: Payer: Self-pay | Admitting: Psychiatry

## 2019-07-21 ENCOUNTER — Other Ambulatory Visit: Payer: Self-pay

## 2019-07-21 ENCOUNTER — Encounter: Payer: Self-pay | Admitting: Psychiatry

## 2019-07-21 ENCOUNTER — Ambulatory Visit (INDEPENDENT_AMBULATORY_CARE_PROVIDER_SITE_OTHER): Payer: Medicare Other | Admitting: Psychiatry

## 2019-07-21 DIAGNOSIS — F39 Unspecified mood [affective] disorder: Secondary | ICD-10-CM

## 2019-07-21 DIAGNOSIS — F5105 Insomnia due to other mental disorder: Secondary | ICD-10-CM

## 2019-07-21 DIAGNOSIS — G251 Drug-induced tremor: Secondary | ICD-10-CM

## 2019-07-21 NOTE — Progress Notes (Signed)
Robin Barton [id number] 1956-01-30 63 y.o.   Virtual Visit via Telephone Note  I connected with pt by telephone and verified that I am speaking with the correct person using two identifiers.   I discussed the limitations, risks, security and privacy concerns of performing an evaluation and management service by telephone and the availability of in person appointments. I also discussed with the patient that there may be a patient responsible charge related to this service. The patient expressed understanding and agreed to proceed.  I discussed the assessment and treatment plan with the patient. The patient was provided an opportunity to ask questions and all were answered. The patient agreed with the plan and demonstrated an understanding of the instructions.   The patient was advised to call back or seek an in-person evaluation if the symptoms worsen or if the condition fails to improve as anticipated.  I provided 15 minutes of non-face-to-face time during this encounter. The call started at 1030 and ended at 1045. The patient was located at home and the provider was located office.   Subjective:   Patient ID:  Robin Barton is a 63 y.o. (DOB Sep 02, 1956) female.  Chief Complaint:  Chief Complaint  Patient presents with  . Follow-up    Medication Management  . Depression    Medication Management    Depression        Associated symptoms include myalgias.  Associated symptoms include no decreased concentration and no suicidal ideas.  Robin Barton presents to the office today for follow-up mood and anxiety.  Last seen May 28.  No meds were changed.  She was doing well psychiatrically.  It was recommended she get a lithium level and BMP and Continue paroxetine 20 mg daily, continue trazodone 50 mg nightly, continue lithium 150 mg alternating with 300 mg every other day.  There are no lithium levels or BMP levels in epic.  I think fine overall.  Covid is a  drain and isolating and interferes with plans.  Mood has been stable and likes the combo of paroxetine and lithium.  Didn't get labs yet DT Covid.  Forgot to get labs after reducing the lithium.  Tremor is much better and manageable with the reduction of lithium.  No worsening of mood with the change.  Tremor has gotten better on it's own.  No longer needs B6.  Patient reports stable mood and denies depressed or irritable moods.  Patient denies any recent difficulty with anxiety except over the tremor.  Patient denies difficulty with sleep initiation or maintenance with trazodone. Denies appetite disturbance.  Patient reports that energy and motivation have been good.  Patient denies any difficulty with concentration.  Patient denies any suicidal ideation.  Past Psychiatric Medication Trials:   Review of Systems:  Review of Systems  Musculoskeletal: Positive for myalgias.  Neurological: Negative for tremors and weakness.  Psychiatric/Behavioral: Positive for depression. Negative for agitation, behavioral problems, confusion, decreased concentration, dysphoric mood, hallucinations, self-injury, sleep disturbance and suicidal ideas. The patient is not nervous/anxious and is not hyperactive.     Medications: I have reviewed the patient's current medications.  Current Outpatient Medications  Medication Sig Dispense Refill  . alendronate (FOSAMAX) 70 MG tablet once a week. Reported on 02/06/2016  0  . atenolol (TENORMIN) 25 MG tablet Take 1 tablet (25 mg total) by mouth daily. 30 tablet 2  . calcium carbonate (OS-CAL - DOSED IN MG OF ELEMENTAL CALCIUM) 1250 MG tablet Take 1 tablet by mouth daily.      Marland Kitchen  Cholecalciferol (VITAMIN D3) 1.25 MG (50000 UT) CAPS TK 1 C PO Q WK    . cyclobenzaprine (FLEXERIL) 10 MG tablet TK 1 T PO Q 8 H PRN FOR MUSCLE SPASMS    . lisinopril (PRINIVIL,ZESTRIL) 40 MG tablet Take 1 tablet (40 mg total) by mouth daily. 30 tablet 2  . lithium carbonate 150 MG capsule TAKE  2 CAPSULES BY MOUTH ON EVEN DAYS AND 1 CAPSULE BY MOUTH ON ODD DAYS 135 capsule 1  . meloxicam (MOBIC) 15 MG tablet Take 15 mg by mouth daily.    Marland Kitchen PARoxetine (PAXIL) 20 MG tablet TAKE 1 TABLET(20 MG) BY MOUTH DAILY 90 tablet 1  . Pyridoxine HCl (VITAMIN B-6) 500 MG tablet Take 500 mg by mouth as needed.     . traZODone (DESYREL) 50 MG tablet Take 1 tablet (50 mg total) by mouth at bedtime. 30 tablet PRN   No current facility-administered medications for this visit.     Medication Side Effects: Other: tremor recently better  Allergies:  Allergies  Allergen Reactions  . Sulfonamide Derivatives Hives, Itching and Rash    Past Medical History:  Diagnosis Date  . Acid reflux   . Anxiety   . Atypical lobular hyperplasia of left breast 11/25/13   lumpectomy, 12/10/13  . BCC (basal cell carcinoma)    per pt, never had cancer  . Depression   . Hyperlipidemia   . Hypertension   . Osteopenia   . Thyroid disease 2010   rt thyroid nodule    Family History  Problem Relation Age of Onset  . Cancer Mother        colon-stage 4  . Hypertension Mother   . Colon cancer Mother   . Other Father   . Breast cancer Sister   . Colon polyps Neg Hx   . Esophageal cancer Neg Hx   . Rectal cancer Neg Hx   . Stomach cancer Neg Hx     Social History   Socioeconomic History  . Marital status: Married    Spouse name: Not on file  . Number of children: 2  . Years of education: Not on file  . Highest education level: Not on file  Occupational History  . Not on file  Social Needs  . Financial resource strain: Not on file  . Food insecurity    Worry: Not on file    Inability: Not on file  . Transportation needs    Medical: Not on file    Non-medical: Not on file  Tobacco Use  . Smoking status: Current Some Day Smoker    Packs/day: 0.50    Years: 35.00    Pack years: 17.50    Types: Cigarettes  . Smokeless tobacco: Never Used  Substance and Sexual Activity  . Alcohol use: Yes     Alcohol/week: 12.0 - 14.0 standard drinks    Types: 12 - 14 Glasses of wine per week  . Drug use: No  . Sexual activity: Yes    Partners: Male    Birth control/protection: Post-menopausal  Lifestyle  . Physical activity    Days per week: Not on file    Minutes per session: Not on file  . Stress: Not on file  Relationships  . Social Herbalist on phone: Not on file    Gets together: Not on file    Attends religious service: Not on file    Active member of club or organization: Not on file  Attends meetings of clubs or organizations: Not on file    Relationship status: Not on file  . Intimate partner violence    Fear of current or ex partner: Not on file    Emotionally abused: Not on file    Physically abused: Not on file    Forced sexual activity: Not on file  Other Topics Concern  . Not on file  Social History Narrative  . Not on file    Past Medical History, Surgical history, Social history, and Family history were reviewed and updated as appropriate.   Please see review of systems for further details on the patient's review from today.   Objective:   Physical Exam:  LMP 09/24/1999   Physical Exam Constitutional:      General: She is not in acute distress.    Appearance: She is well-developed.  Musculoskeletal:        General: No deformity.  Neurological:     Mental Status: She is alert and oriented to person, place, and time.     Cranial Nerves: No dysarthria.     Coordination: Coordination normal.  Psychiatric:        Attention and Perception: Attention and perception normal. She does not perceive auditory or visual hallucinations.        Mood and Affect: Mood normal. Mood is not anxious or depressed. Affect is not labile, blunt, angry or inappropriate.        Speech: Speech normal.        Behavior: Behavior normal. Behavior is cooperative.        Thought Content: Thought content normal. Thought content is not paranoid or delusional. Thought  content does not include homicidal or suicidal ideation. Thought content does not include homicidal or suicidal plan.        Cognition and Memory: Cognition and memory normal.        Judgment: Judgment normal.     Comments: Insight good     Lab Review:     Component Value Date/Time   NA 135 06/18/2018 0934   NA 139 07/19/2014 0903   K 4.9 06/18/2018 0934   K 4.7 07/19/2014 0903   CL 103 06/18/2018 0934   CO2 26 06/18/2018 0934   CO2 24 07/19/2014 0903   GLUCOSE 95 06/18/2018 0934   GLUCOSE 95 07/19/2014 0903   BUN 31 (H) 06/18/2018 0934   BUN 22.8 07/19/2014 0903   CREATININE 1.10 (H) 06/18/2018 0934   CREATININE 0.8 07/19/2014 0903   CALCIUM 10.6 (H) 06/18/2018 0934   CALCIUM 9.6 07/19/2014 0903   PROT 7.3 07/19/2014 0903   ALBUMIN 4.1 07/19/2014 0903   AST 23 07/19/2014 0903   ALT 21 07/19/2014 0903   ALKPHOS 54 07/19/2014 0903   BILITOT 0.49 07/19/2014 0903   GFRNONAA 53 (L) 06/18/2018 0934   GFRAA 61 06/18/2018 0934       Component Value Date/Time   WBC 5.0 07/19/2014 0903   WBC 5.3 04/05/2011 0923   RBC 3.94 07/19/2014 0903   RBC 4.05 04/05/2011 0923   HGB 12.5 07/19/2014 0903   HCT 37.8 07/19/2014 0903   PLT 370 07/19/2014 0903   MCV 96.0 07/19/2014 0903   MCH 31.8 07/19/2014 0903   MCH 31.9 04/05/2011 0923   MCHC 33.1 07/19/2014 0903   MCHC 33.2 04/05/2011 0923   RDW 12.8 07/19/2014 0903   LYMPHSABS 1.5 07/19/2014 0903   MONOABS 0.6 07/19/2014 0903   EOSABS 0.1 07/19/2014 0903   BASOSABS 0.0 07/19/2014 [id number]  Lithium Lvl  Date Value Ref Range Status  06/18/2018 1.1 0.6 - 1.2 mmol/L Final     No results found for: PHENYTOIN, PHENOBARB, VALPROATE, CBMZ   .res Assessment: Plan:    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  Overall she remained stable after the reduction in both lithium and paroxetine to  current dosages.  No indication for any changes.  We discussed her history of response to lithium at the lower dose 150 and then the 300 mg a day.  We discussed her side effects of tremor.  It's better now. We discussed drug to drug interactions that may be elevating her lithium level.  The tremor is better with the reduction to the current dose of lithium and the use of B6.  It is now manageable.  Disc option stopping B6 to see if she still needs it.  We discussed the question of her diagnosis given at prior history of 2 weeks of hypomania but that may have been related to medications.  The uncertainty whether she truly has major depression versus bipolar disorder remains.  We discussed the risk of antidepressants causing mood cycling and the advantage to being on a lower dose.  She needs to repeat the lithium level and the BMP because of some concerns about the calcium as well.  No med changes today. Continue paroxetine 20 mg daily, continue trazodone 50 mg nightly, continue lithium 150 mg alternating with 300 mg every other day.  Follow-up 6 months  Lynder Parents MD, DFAPA\  Future Appointments  Date Time Provider [facility]  02/11/2020 11:00 AM Gardenia Phlegm, NP [facility] None  05/05/2020 10:30 AM Megan Salon, MD [place] None    No orders of the defined types were placed in this encounter.     -------------------------------

## 2019-08-06 ENCOUNTER — Other Ambulatory Visit: Payer: Self-pay

## 2019-08-06 DIAGNOSIS — Z20822 Contact with and (suspected) exposure to covid-19: Secondary | ICD-10-CM

## 2019-08-09 LAB — NOVEL CORONAVIRUS, NAA: SARS-CoV-2, NAA: NOT DETECTED

## 2019-08-13 ENCOUNTER — Other Ambulatory Visit: Payer: Self-pay

## 2019-08-13 DIAGNOSIS — Z20822 Contact with and (suspected) exposure to covid-19: Secondary | ICD-10-CM

## 2019-08-16 LAB — NOVEL CORONAVIRUS, NAA: SARS-CoV-2, NAA: NOT DETECTED

## 2019-09-23 ENCOUNTER — Encounter

## 2019-10-18 ENCOUNTER — Ambulatory Visit: Payer: Medicare Other | Attending: Internal Medicine

## 2019-10-18 DIAGNOSIS — Z23 Encounter for immunization: Secondary | ICD-10-CM | POA: Insufficient documentation

## 2019-10-18 NOTE — Progress Notes (Signed)
   Covid-19 Vaccination Clinic  Name:  Robin Barton    MRN: [id number] DOB: May 16, 1956  10/18/2019  [name] was observed post Covid-19 immunization for 15 minutes without incidence. She was provided with Vaccine Information Sheet and instruction to access the V-Safe system.   [name] was instructed to call 911 with any severe reactions post vaccine: Marland Kitchen Difficulty breathing  . Swelling of your face and throat  . A fast heartbeat  . A bad rash all over your body  . Dizziness and weakness    Immunizations Administered    Name Date Dose VIS Date Route   Pfizer COVID-19 Vaccine 10/18/2019  9:59 AM 0.3 mL 09/03/2019 Intramuscular   Manufacturer: [place]   Lot: [id number]   [place]: [id number]

## 2019-11-02 ENCOUNTER — Other Ambulatory Visit: Payer: Self-pay | Admitting: Psychiatry

## 2019-11-08 ENCOUNTER — Ambulatory Visit: Payer: Medicare Other | Attending: Internal Medicine

## 2019-11-08 DIAGNOSIS — Z23 Encounter for immunization: Secondary | ICD-10-CM | POA: Insufficient documentation

## 2019-11-08 NOTE — Progress Notes (Signed)
   Covid-19 Vaccination Clinic  Name:  Robin Barton    MRN: [id number] DOB: 01/22/1956  11/08/2019  [name] was observed post Covid-19 immunization for 15 minutes without incidence. She was provided with Vaccine Information Sheet and instruction to access the V-Safe system.   [name] was instructed to call 911 with any severe reactions post vaccine: Marland Kitchen Difficulty breathing  . Swelling of your face and throat  . A fast heartbeat  . A bad rash all over your body  . Dizziness and weakness    Immunizations Administered    Name Date Dose VIS Date Route   Pfizer COVID-19 Vaccine 11/08/2019 10:00 AM 0.3 mL 09/03/2019 Intramuscular   Manufacturer: [place]   Lot: [id number]   [place]: [id number]

## 2019-12-23 ENCOUNTER — Ambulatory Visit (INDEPENDENT_AMBULATORY_CARE_PROVIDER_SITE_OTHER): Payer: Medicare Other

## 2019-12-23 ENCOUNTER — Other Ambulatory Visit: Payer: Self-pay | Admitting: Podiatry

## 2019-12-23 ENCOUNTER — Other Ambulatory Visit: Payer: Self-pay

## 2019-12-23 ENCOUNTER — Ambulatory Visit: Payer: Medicare Other | Admitting: Podiatry

## 2019-12-23 DIAGNOSIS — M79672 Pain in left foot: Secondary | ICD-10-CM

## 2019-12-23 DIAGNOSIS — M7672 Peroneal tendinitis, left leg: Secondary | ICD-10-CM

## 2019-12-23 DIAGNOSIS — S99912A Unspecified injury of left ankle, initial encounter: Secondary | ICD-10-CM

## 2019-12-23 NOTE — Progress Notes (Signed)
  Subjective:  Patient ID: Robin Barton, female    DOB: [birth year],  MRN: [id number]  Chief Complaint  Patient presents with  . Foot Injury    Pt states "I stepped bad 4 weeks ago and my ankle and foot have hurt since then". Left foot posterior heel and left lateral ankle pain.    64 y.o. female presents with the above complaint. History confirmed with patient.   Objective:  Physical Exam: warm, good capillary refill, no trophic changes or ulcerative lesions, normal DP and PT pulses and normal sensory exam. Left Foot: tenderness left lateral ankle at the peroneal tendons. Mild ATFL pain.   No images are attached to the encounter.  Radiographs: X-ray of the left foot/ankle: no fracture, dislocation, swelling or degenerative changes noted Assessment:   1. Peroneal tendonitis of left lower leg   2. Ankle injury, left, initial encounter    Plan:  Patient was evaluated and treated and all questions answered.  Peroneal Tendonitis -X-rays reviewed as above -Dispensed Tri-Lock ankle brace.  Patient educated on use  -F/u in 4 weeks for recheck.  No follow-ups on file.

## 2020-01-17 ENCOUNTER — Encounter: Payer: Self-pay | Admitting: Psychiatry

## 2020-01-17 ENCOUNTER — Other Ambulatory Visit: Payer: Self-pay

## 2020-01-17 ENCOUNTER — Ambulatory Visit (INDEPENDENT_AMBULATORY_CARE_PROVIDER_SITE_OTHER): Payer: Medicare Other | Admitting: Psychiatry

## 2020-01-17 DIAGNOSIS — F39 Unspecified mood [affective] disorder: Secondary | ICD-10-CM

## 2020-01-17 DIAGNOSIS — Z79899 Other long term (current) drug therapy: Secondary | ICD-10-CM

## 2020-01-17 DIAGNOSIS — F411 Generalized anxiety disorder: Secondary | ICD-10-CM

## 2020-01-17 DIAGNOSIS — F5105 Insomnia due to other mental disorder: Secondary | ICD-10-CM

## 2020-01-17 DIAGNOSIS — G251 Drug-induced tremor: Secondary | ICD-10-CM

## 2020-01-17 MED ORDER — PAROXETINE HCL 20 MG PO TABS: 20.0000 mg | ORAL_TABLET | Freq: Every day | ORAL | 1 refills | Status: DC

## 2020-01-17 MED ORDER — TRAZODONE HCL 50 MG PO TABS: 50.0000 mg | ORAL_TABLET | Freq: Every day | ORAL | 1 refills | Status: DC

## 2020-01-17 MED ORDER — LITHIUM CARBONATE 150 MG PO CAPS: ORAL_CAPSULE | ORAL | 1 refills | Status: DC

## 2020-01-17 MED ORDER — ALPRAZOLAM 0.5 MG PO TABS: 0.5000 mg | ORAL_TABLET | Freq: Every day | ORAL | 0 refills | Status: DC | PRN

## 2020-01-17 NOTE — Progress Notes (Signed)
[name] KH:[id number] [birth year] 64 y.o.    Subjective:   Patient ID:  Robin Barton is a 64 y.o. (DOB 06/06/1956) female.  Chief Complaint:  Chief Complaint  Patient presents with  . Follow-up    mood  . Medication Problem    tremor    Depression        Associated symptoms include myalgias.  Associated symptoms include no decreased concentration and no suicidal ideas.  Robin Barton presents to the office today for follow-up mood and anxiety.  seen October 2020.  No meds were changed.  She was doing well psychiatrically.  It was recommended she get a lithium level and BMP and Continue paroxetine 20 mg daily, continue trazodone 50 mg nightly, continue lithium 150 mg alternating with 300 mg every other day.  There are still no lithium levels or BMP levels in epic though requested at last 2 visits.  January 17, 2020 appointment the following is noted: .Ok.  Exposed to virus twice and had to QT.  Had to hlep sister with med probvlems.   Forgot the labs.   Gotten vaccinated. Not taking Xanax.   Sleeps Robin Barton with trazodone.  Forgot to get labs after reducing the lithium.  Tremor is much better and manageable with the reduction of lithium.  No worsening of mood with the change.  Tremor has gotten better on it's own.  No longer needs B6.  Patient reports stable mood and denies depressed or irritable moods.  Still feels anxious in AM.  Drinks coffee.  .  Patient denies difficulty with sleep initiation or maintenance with trazodone. Denies appetite disturbance.  Patient reports that energy and motivation have been good.  Patient denies any difficulty with concentration.  Patient denies any suicidal ideation.  Past Psychiatric Medication Trials: paroxetine, lithium Xanax, trazodone   Review of Systems:  Review of Systems  Musculoskeletal: Positive for myalgias.  Neurological: Positive for tremors. Negative for weakness.  Psychiatric/Behavioral: Positive for  depression. Negative for agitation, behavioral problems, confusion, decreased concentration, dysphoric mood, hallucinations, self-injury, sleep disturbance and suicidal ideas. The patient is not nervous/anxious and is not hyperactive.     Medications: I have reviewed the patient's current medications.  Current Outpatient Medications  Medication Sig Dispense Refill  . alendronate (FOSAMAX) 70 MG tablet once a week. Reported on 02/06/2016  0  . atenolol (TENORMIN) 25 MG tablet Take 1 tablet (25 mg total) by mouth daily. 30 tablet 2  . calcium carbonate (OS-CAL - DOSED IN MG OF ELEMENTAL CALCIUM) 1250 MG tablet Take 1 tablet by mouth daily.      . Cholecalciferol (VITAMIN D3) 1.25 MG (50000 UT) CAPS TK 1 C PO Q WK    . cyclobenzaprine (FLEXERIL) 10 MG tablet TK 1 T PO Q 8 H PRN FOR MUSCLE SPASMS    . lisinopril (PRINIVIL,ZESTRIL) 40 MG tablet Take 1 tablet (40 mg total) by mouth daily. 30 tablet 2  . lithium carbonate 150 MG capsule Alternate 2 capsules with 1 capsule every other night 135 capsule 1  . meloxicam (MOBIC) 15 MG tablet Take 15 mg by mouth daily.    Marland Kitchen PARoxetine (PAXIL) 20 MG tablet Take 1 tablet (20 mg total) by mouth daily. 90 tablet 1  . Pyridoxine HCl (VITAMIN B-6) 500 MG tablet Take 500 mg by mouth as needed.     . traZODone (DESYREL) 50 MG tablet Take 1 tablet (50 mg total) by mouth at bedtime. 90 tablet 1  . ALPRAZolam (XANAX) 0.5  MG tablet Take 1 tablet (0.5 mg total) by mouth daily as needed. 30 tablet 0   No current facility-administered medications for this visit.    Medication Side Effects: Other: tremor recently better  Allergies:  Allergies  Allergen Reactions  . Sulfonamide Derivatives Hives, Itching and Rash    Past Medical History:  Diagnosis Date  . Acid reflux   . Anxiety   . Atypical lobular hyperplasia of left breast 11/25/13   lumpectomy, 12/10/13  . BCC (basal cell carcinoma)    per pt, never had cancer  . Depression   . Hyperlipidemia   .  Hypertension   . Osteopenia   . Thyroid disease 2010   rt thyroid nodule    Family History  Problem Relation Age of Onset  . Cancer Mother        colon-stage 4  . Hypertension Mother   . Colon cancer Mother   . Other Father   . Breast cancer Sister   . Colon polyps Neg Hx   . Esophageal cancer Neg Hx   . Rectal cancer Neg Hx   . Stomach cancer Neg Hx     Social History   Socioeconomic History  . Marital status: Married    Spouse name: Not on file  . Number of children: 2  . Years of education: Not on file  . Highest education level: Not on file  Occupational History  . Not on file  Tobacco Use  . Smoking status: Current Some Day Smoker    Packs/day: 0.50    Years: 35.00    Pack years: 17.50    Types: Cigarettes  . Smokeless tobacco: Never Used  Substance and Sexual Activity  . Alcohol use: Yes    Alcohol/week: 12.0 - 14.0 standard drinks    Types: 12 - 14 Glasses of wine per week  . Drug use: No  . Sexual activity: Yes    Partners: Male    Birth control/protection: Post-menopausal  Other Topics Concern  . Not on file  Social History Narrative  . Not on file   Social Determinants of Health   Financial Resource Strain:   . Difficulty of Paying Living Expenses:   Food Insecurity:   . Worried About Charity fundraiser in the Last Year:   . Arboriculturist in the Last Year:   Transportation Needs:   . Film/video editor (Medical):   Marland Kitchen Lack of Transportation (Non-Medical):   Physical Activity:   . Days of Exercise per Week:   . Minutes of Exercise per Session:   Stress:   . Feeling of Stress :   Social Connections:   . Frequency of Communication with Friends and Family:   . Frequency of Social Gatherings with Friends and Family:   . Attends Religious Services:   . Active Member of Clubs or Organizations:   . Attends Archivist Meetings:   Marland Kitchen Marital Status:   Intimate Partner Violence:   . Fear of Current or Ex-Partner:   . Emotionally  Abused:   Marland Kitchen Physically Abused:   . Sexually Abused:     Past Medical History, Surgical history, Social history, and Family history were reviewed and updated as appropriate.   Please see review of systems for further details on the patient's review from today.   Objective:   Physical Exam:  LMP 09/24/1999   Physical Exam Constitutional:      General: She is not in acute distress.    Appearance:  She is well-developed.  Musculoskeletal:        General: No deformity.  Neurological:     Mental Status: She is alert and oriented to person, place, and time.     Cranial Nerves: No dysarthria.     Coordination: Coordination normal.  Psychiatric:        Attention and Perception: Attention and perception normal. She does not perceive auditory or visual hallucinations.        Mood and Affect: Mood normal. Mood is not anxious or depressed. Affect is not labile, blunt, angry or inappropriate.        Speech: Speech normal. Speech is not slurred.        Behavior: Behavior normal. Behavior is cooperative.        Thought Content: Thought content normal. Thought content is not paranoid or delusional. Thought content does not include homicidal or suicidal ideation. Thought content does not include homicidal or suicidal plan.        Cognition and Memory: Cognition and memory normal.        Judgment: Judgment normal.     Comments: Insight good     Lab Review:     Component Value Date/Time   NA 135 06/18/2018 0934   NA 139 07/19/2014 0903   K 4.9 06/18/2018 0934   K 4.7 07/19/2014 0903   CL 103 06/18/2018 0934   CO2 26 06/18/2018 0934   CO2 24 07/19/2014 0903   GLUCOSE 95 06/18/2018 0934   GLUCOSE 95 07/19/2014 0903   BUN 31 (H) 06/18/2018 0934   BUN 22.8 07/19/2014 0903   CREATININE 1.10 (H) 06/18/2018 0934   CREATININE 0.8 07/19/2014 0903   CALCIUM 10.6 (H) 06/18/2018 0934   CALCIUM 9.6 07/19/2014 0903   PROT 7.3 07/19/2014 0903   ALBUMIN 4.1 07/19/2014 0903   AST 23 07/19/2014 0903    ALT 21 07/19/2014 0903   ALKPHOS 54 07/19/2014 0903   BILITOT 0.49 07/19/2014 0903   GFRNONAA 53 (L) 06/18/2018 0934   GFRAA 61 06/18/2018 0934       Component Value Date/Time   WBC 5.0 07/19/2014 0903   WBC 5.3 04/05/2011 0923   RBC 3.94 07/19/2014 0903   RBC 4.05 04/05/2011 0923   HGB 12.5 07/19/2014 0903   HCT 37.8 07/19/2014 0903   PLT 370 07/19/2014 0903   MCV 96.0 07/19/2014 0903   MCH 31.8 07/19/2014 0903   MCH 31.9 04/05/2011 0923   MCHC 33.1 07/19/2014 0903   MCHC 33.2 04/05/2011 0923   RDW 12.8 07/19/2014 0903   LYMPHSABS 1.5 07/19/2014 0903   MONOABS 0.6 07/19/2014 0903   EOSABS 0.1 07/19/2014 0903   BASOSABS 0.0 07/19/2014 0903    Lithium Lvl  Date Value Ref Range Status  06/18/2018 1.1 0.6 - 1.2 mmol/L Final     No results found for: PHENYTOIN, PHENOBARB, VALPROATE, CBMZ   .res Assessment: Plan:    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  Overall she remained stable after the reduction in both lithium and paroxetine to current dosages.  No indication for any changes.  We  discussed her history of response to lithium at the lower dose 150 and then the 300 mg a day.  We discussed her side effects of tremor.  It's better now. We discussed drug to drug interactions that may be elevating her lithium level.  The tremor is better with the reduction to the current dose of lithium and the use of B6.  It is now manageable.  Disc option stopping B6 to see if she still needs it.  We discussed the question of her diagnosis  given at prior history of 2 weeks of hypomania but that may have been related to medications.  The uncertainty whether she truly has major depression versus bipolar disorder remains.  We discussed the risk of antidepressants causing mood cycling and the advantage to being on a lower dose.  Switch to decaf in AM to help anxiety.  That may be the problem.  She needs to repeat the lithium level and the BMP because of some concerns about the calcium as well.  This was emphasized.  No med changes today. Continue paroxetine 20 mg daily, continue trazodone 50 mg nightly, continue lithium 150 mg alternating with 300 mg every other day.  Asks for alprazolam prn anxiety.  Follow-up 6 months  Lynder Parents MD, DFAPA\  Future Appointments  Date Time Provider [place]  01/20/2020  9:45 AM Evelina Bucy, DPM [facility]  02/11/2020 11:00 AM Delice Bison, Charlestine Massed, NP [facility] None  05/05/2020 10:30 AM Megan Salon, MD [facility] None    Orders Placed This Encounter  Procedures  . Basic metabolic panel  . Lithium level  . TSH      -------------------------------

## 2020-01-20 ENCOUNTER — Ambulatory Visit: Payer: Medicare Other | Admitting: Podiatry

## 2020-01-27 ENCOUNTER — Other Ambulatory Visit: Payer: Self-pay | Admitting: Internal Medicine

## 2020-01-27 DIAGNOSIS — E041 Nontoxic single thyroid nodule: Secondary | ICD-10-CM

## 2020-02-08 ENCOUNTER — Ambulatory Visit
Admission: RE | Admit: 2020-02-08 | Discharge: 2020-02-08 | Disposition: A | Discharge status: Home / Self Care | Payer: Medicare Other | Source: Ambulatory Visit | Attending: Internal Medicine | Admitting: Internal Medicine

## 2020-02-08 ENCOUNTER — Telehealth: Payer: Self-pay | Admitting: Adult Health

## 2020-02-08 DIAGNOSIS — E041 Nontoxic single thyroid nodule: Secondary | ICD-10-CM

## 2020-02-08 NOTE — Telephone Encounter (Signed)
Rescheduled LTS visit per provider template change. Pt is aware of new appt date and time.

## 2020-02-11 ENCOUNTER — Inpatient Hospital Stay: Payer: Medicare Other | Admitting: Adult Health

## 2020-02-23 ENCOUNTER — Encounter: Payer: Self-pay | Admitting: Adult Health

## 2020-02-23 ENCOUNTER — Other Ambulatory Visit: Payer: Self-pay

## 2020-02-23 ENCOUNTER — Inpatient Hospital Stay: Payer: Medicare Other | Attending: Adult Health | Admitting: Adult Health

## 2020-02-23 VITALS — BP 125/68 | HR 61 | Temp 98.3°F | Resp 18 | Ht 61.0 in | Wt 116.6 lb

## 2020-02-23 DIAGNOSIS — D0502 Lobular carcinoma in situ of left breast: Secondary | ICD-10-CM | POA: Diagnosis not present

## 2020-02-23 DIAGNOSIS — Z800 Family history of malignant neoplasm of digestive organs: Secondary | ICD-10-CM | POA: Diagnosis not present

## 2020-02-23 DIAGNOSIS — K219 Gastro-esophageal reflux disease without esophagitis: Secondary | ICD-10-CM | POA: Insufficient documentation

## 2020-02-23 DIAGNOSIS — Z803 Family history of malignant neoplasm of breast: Secondary | ICD-10-CM | POA: Insufficient documentation

## 2020-02-23 DIAGNOSIS — F419 Anxiety disorder, unspecified: Secondary | ICD-10-CM | POA: Insufficient documentation

## 2020-02-23 DIAGNOSIS — Z853 Personal history of malignant neoplasm of breast: Secondary | ICD-10-CM | POA: Diagnosis not present

## 2020-02-23 DIAGNOSIS — Z79899 Other long term (current) drug therapy: Secondary | ICD-10-CM | POA: Insufficient documentation

## 2020-02-23 DIAGNOSIS — Z85828 Personal history of other malignant neoplasm of skin: Secondary | ICD-10-CM | POA: Insufficient documentation

## 2020-02-23 DIAGNOSIS — F17210 Nicotine dependence, cigarettes, uncomplicated: Secondary | ICD-10-CM | POA: Insufficient documentation

## 2020-02-23 DIAGNOSIS — M8580 Other specified disorders of bone density and structure, unspecified site: Secondary | ICD-10-CM | POA: Diagnosis not present

## 2020-02-23 DIAGNOSIS — Z8249 Family history of ischemic heart disease and other diseases of the circulatory system: Secondary | ICD-10-CM | POA: Insufficient documentation

## 2020-02-23 DIAGNOSIS — Z170 Estrogen receptor positive status [ER+]: Secondary | ICD-10-CM | POA: Insufficient documentation

## 2020-02-23 NOTE — Progress Notes (Signed)
CLINIC:  Survivorship   REASON FOR VISIT:  Routine follow-up for history of LCIS.   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  S/p biopsy, lumpectomy and 5 years of Letrozole completed in 2020.   INTERVAL HISTORY:  Robin Barton presents to the Survivorship [facility] today for routine follow-up for her history of breast cancer.  Overall, she reports feeling quite well. She denies any new issues.  She sees her PCP regularly.  She notes that she underwent mammogram last year.  She was due last month, however her sister unexpectedly passed away, and her funeral was on the day of her scheduled mammogram.  She plans on rescheduling her mammogram today.    She underwent bone density testing with her PCP and it was essentially stable.  She is taking fosamax weekly and notes she does occasionally miss a dose.  She is up to date with her other cancer screenings.  She is active with walking and gardening.    REVIEW OF SYSTEMS:  Review of Systems  Constitutional: Negative for appetite change, chills, fatigue, fever and unexpected weight change.  HENT:   Negative for hearing loss, lump/mass and sore throat.   Eyes: Negative for eye problems and icterus.  Respiratory: Negative for chest tightness, cough and shortness of breath.   Cardiovascular: Negative for chest pain, leg swelling and palpitations.  Gastrointestinal: Negative for abdominal distention, abdominal pain, constipation, diarrhea, nausea and vomiting.  Endocrine: Negative for hot flashes.  Genitourinary: Negative for difficulty urinating.   Musculoskeletal: Negative for arthralgias.  Skin: Negative for itching and rash.  Neurological: Negative for dizziness, extremity weakness, headaches and numbness.  Hematological: Negative for adenopathy. Does not bruise/bleed easily.  Psychiatric/Behavioral: Negative for depression. The patient is not nervous/anxious.    Breast: Denies any new nodularity, masses,  tenderness, nipple changes, or nipple discharge.       PAST MEDICAL/SURGICAL HISTORY:  Past Medical History:  Diagnosis Date  . Acid reflux   . Anxiety   . Atypical lobular hyperplasia of left breast 11/25/13   lumpectomy, 12/10/13  . BCC (basal cell carcinoma)    per pt, never had cancer  . Depression   . Hyperlipidemia   . Hypertension   . Osteopenia   . Thyroid disease 2010   rt thyroid nodule   Past Surgical History:  Procedure Laterality Date  . BREAST LUMPECTOMY WITH RADIOACTIVE SEED LOCALIZATION Left 12/10/2013   Procedure: BREAST LUMPECTOMY WITH RADIOACTIVE SEED LOCALIZATION;  Surgeon: [name], MD;  Location: [facility];  Service: General;  Laterality: Left;  . BUNIONECTOMY  2/13 left foot and toe straightened  . BUNIONECTOMY  3/13 right foot and toe straightened  . COLONOSCOPY    . NASAL SEPTUM SURGERY  1982     ALLERGIES:  Allergies  Allergen Reactions  . Sulfonamide Derivatives Hives, Itching and Rash     CURRENT MEDICATIONS:  Outpatient Encounter Medications as of 02/23/2020  Medication Sig Note  . alendronate (FOSAMAX) 70 MG tablet once a week. Reported on 02/06/2016 02/01/2015: Received from: External Pharmacy  . ALPRAZolam (XANAX) 0.5 MG tablet Take 1 tablet (0.5 mg total) by mouth daily as needed.   Marland Kitchen atenolol (TENORMIN) 25 MG tablet Take 1 tablet (25 mg total) by mouth daily.   . calcium carbonate (OS-CAL - DOSED IN MG OF ELEMENTAL CALCIUM) 1250 MG tablet Take 1 tablet by mouth daily.     . Cholecalciferol (VITAMIN D3) 1.25 MG (50000 UT) CAPS TK 1 C  PO Q WK   . cyclobenzaprine (FLEXERIL) 10 MG tablet TK 1 T PO Q 8 H PRN FOR MUSCLE SPASMS   . lisinopril (PRINIVIL,ZESTRIL) 40 MG tablet Take 1 tablet (40 mg total) by mouth daily.   Marland Kitchen lithium carbonate 150 MG capsule Alternate 2 capsules with 1 capsule every other night   . meloxicam (MOBIC) 15 MG tablet Take 15 mg by mouth daily.   Marland Kitchen PARoxetine (PAXIL) 20 MG tablet Take 1 tablet (20 mg  total) by mouth daily.   . Pyridoxine HCl (VITAMIN B-6) 500 MG tablet Take 500 mg by mouth as needed.    . traZODone (DESYREL) 50 MG tablet Take 1 tablet (50 mg total) by mouth at bedtime.    No facility-administered encounter medications on file as of 02/23/2020.     ONCOLOGIC FAMILY HISTORY:  Family History  Problem Relation Age of Onset  . Cancer Mother        colon-stage 4  . Hypertension Mother   . Colon cancer Mother   . Other Father   . Breast cancer Sister   . Colon polyps Neg Hx   . Esophageal cancer Neg Hx   . Rectal cancer Neg Hx   . Stomach cancer Neg Hx      SOCIAL HISTORY: Social History   Socioeconomic History  . Marital status: Married    Spouse name: Not on file  . Number of children: 2  . Years of education: Not on file  . Highest education level: Not on file  Occupational History  . Not on file  Tobacco Use  . Smoking status: Current Some Day Smoker    Packs/day: 0.50    Years: 35.00    Pack years: 17.50    Types: Cigarettes  . Smokeless tobacco: Never Used  Substance and Sexual Activity  . Alcohol use: Yes    Alcohol/week: 12.0 - 14.0 standard drinks    Types: 12 - 14 Glasses of wine per week  . Drug use: No  . Sexual activity: Yes    Partners: Male    Birth control/protection: Post-menopausal  Other Topics Concern  . Not on file  Social History Narrative  . Not on file   Social Determinants of Health   Financial Resource Strain:   . Difficulty of Paying Living Expenses:   Food Insecurity:   . Worried About Charity fundraiser in the Last Year:   . Arboriculturist in the Last Year:   Transportation Needs:   . Film/video editor (Medical):   Marland Kitchen Lack of Transportation (Non-Medical):   Physical Activity:   . Days of Exercise per Week:   . Minutes of Exercise per Session:   Stress:   . Feeling of Stress :   Social Connections:   . Frequency of Communication with Friends and Family:   . Frequency of Social Gatherings with Friends  and Family:   . Attends Religious Services:   . Active Member of Clubs or Organizations:   . Attends Archivist Meetings:   Marland Kitchen Marital Status:   Intimate Partner Violence:   . Fear of Current or Ex-Partner:   . Emotionally Abused:   Marland Kitchen Physically Abused:   . Sexually Abused:      PHYSICAL EXAMINATION:  Vital Signs: Vitals:   02/23/20 1049  BP: 125/68  Pulse: 61  Resp: 18  Temp: 98.3 F (36.8 C)  SpO2: 100%   Filed Weights   02/23/20 1049  Weight: 116 lb  9.6 oz (52.9 kg)   General: Well-nourished, well-appearing female in no acute distress.  Unaccompanied today.   HEENT: Head is normocephalic.  Pupils equal and reactive to light. Conjunctivae clear without exudate.  Sclerae anicteric. Oral mucosa is pink, moist.  Oropharynx is pink without lesions or erythema.  Lymph: No cervical, supraclavicular, or infraclavicular lymphadenopathy noted on palpation.  Cardiovascular: Regular rate and rhythm.Marland Kitchen Respiratory: Clear to auscultation bilaterally. Chest expansion symmetric; breathing non-labored.  Breast Exam:  -Left breast: No appreciable masses on palpation. No skin redness, thickening, or peau d'orange appearance; no nipple retraction or nipple discharge; mild distortion in symmetry at previous lumpectomy site well healed scar without erythema or nodularity.  -Right breast: No appreciable masses on palpation. No skin redness, thickening, or peau d'orange appearance; no nipple retraction or nipple discharge. -Axilla: No axillary adenopathy bilaterally.  GI: Abdomen soft and round; non-tender, non-distended. Bowel sounds normoactive. No hepatosplenomegaly.   GU: Deferred.  Neuro: No focal deficits. Steady gait.  Psych: Mood and affect normal and appropriate for situation.  MSK: No focal spinal tenderness to palpation, full range of motion in bilateral upper extremities Extremities: No edema. Skin: Warm and dry.  LABORATORY DATA:  None for this visit   DIAGNOSTIC  IMAGING:  Most recent mammogram:  Pending results from [place]:  Robin Barton is a pleasant 64 y.o. female with history of LCIS diagnosed in 2015 s/p lumpectomy and 5 years of antiestrogen therapy.  She presents to the [facility] for surveillance and routine follow-up.   1. History of breast cancer:  Robin Barton is currently clinically and radiographically without evidence of breast cancer. She will be due for mammogram in this month, and plans on getting this rescheduled today.  She will return in one year for continued surveillance and monitoring.  I encouraged her to call me with any questions or concerns before her next visit at the cancer center, and I would be happy to see her sooner, if needed.    2. Bone health:  She has h/o osteopenia and is taking Fosamax weekly.  I recommended that she continue f/u with her pcp about this. She was given education on specific food and activities to promote bone health.  3. Cancer screening:  Due to Robin Barton's history and her age, she should receive screening for skin cancers, colon cancer, and gynecologic cancers. She was encouraged to follow-up with her PCP for appropriate cancer screenings.   4. Health maintenance and wellness promotion: Robin Barton was encouraged to consume 5-7 servings of fruits and vegetables per day. She was also encouraged to engage in moderate to vigorous exercise for 30 minutes per day most days of the week. She was instructed to limit her alcohol consumption and continue to abstain from tobacco use.    Dispo:  -Return to cancer center in one year for surveillance -Mammogram this month   Total encounter time: 20 minutes  Wilber Bihari, NP 02/23/20 11:01 AM Medical Oncology and Hematology [facility] [place], [place] [address] Tel. [phone]    Fax. [phone]  *Total Encounter Time as defined by the Centers for Medicare and Medicaid Services  includes, in addition to the face-to-face time of a patient visit (documented in the note above) non-face-to-face time: obtaining and reviewing outside history, ordering and reviewing medications, tests or procedures, care coordination (communications with other health care professionals or caregivers) and documentation in the medical record.  Note: PRIMARY CARE PROVIDER [place],  Nicki Reaper, [place] [phone]

## 2020-02-24 ENCOUNTER — Telehealth: Payer: Self-pay | Admitting: Adult Health

## 2020-02-24 NOTE — Telephone Encounter (Signed)
No 6/2 los. No changes made to pt's schedule.

## 2020-04-21 NOTE — Progress Notes (Deleted)
64 y.o. G58P0002 Married White or Caucasian female here for annual exam.    Patient's last menstrual period was 09/24/1999.          Sexually active: {yes no:314532}  The current method of family planning is post menopausal status.    Exercising: {yes no:314532}  {types:19826} Smoker:  {YES NO:22349}  Health Maintenance: Pap:  08-20-16 neg HPV HR neg, 02-18-2019 neg History of abnormal Pap:  no MMG:  12-31-17 category b density birads 1:neg Colonoscopy:  03-15-2019 BMD:   01-03-15, with Dr Jackelyn Poling TDaP:  2020 Pneumonia vaccine(s):  2016 Shingrix:   Done 2018 Hep C testing: pcp Screening Labs: ***   reports that she has been smoking cigarettes. She has a 17.50 pack-year smoking history. She has never used smokeless tobacco. She reports current alcohol use of about 12.0 - 14.0 standard drinks of alcohol per week. She reports that she does not use drugs.  Past Medical History:  Diagnosis Date  . Acid reflux   . Anxiety   . Atypical lobular hyperplasia of left breast 11/25/13   lumpectomy, 12/10/13  . BCC (basal cell carcinoma)    per pt, never had cancer  . Depression   . Hyperlipidemia   . Hypertension   . Osteopenia   . Thyroid disease 2010   rt thyroid nodule    Past Surgical History:  Procedure Laterality Date  . BREAST LUMPECTOMY WITH RADIOACTIVE SEED LOCALIZATION Left 12/10/2013   Procedure: BREAST LUMPECTOMY WITH RADIOACTIVE SEED LOCALIZATION;  Surgeon: [name], MD;  Location: [facility];  Service: General;  Laterality: Left;  . BUNIONECTOMY  2/13 left foot and toe straightened  . BUNIONECTOMY  3/13 right foot and toe straightened  . COLONOSCOPY    . NASAL SEPTUM SURGERY  1982    Current Outpatient Medications  Medication Sig Dispense Refill  . alendronate (FOSAMAX) 70 MG tablet once a week. Reported on 02/06/2016  0  . ALPRAZolam (XANAX) 0.5 MG tablet Take 1 tablet (0.5 mg total) by mouth daily as needed. 30 tablet 0  . atenolol (TENORMIN) 25 MG  tablet Take 1 tablet (25 mg total) by mouth daily. 30 tablet 2  . calcium carbonate (OS-CAL - DOSED IN MG OF ELEMENTAL CALCIUM) 1250 MG tablet Take 1 tablet by mouth daily.      . Cholecalciferol (VITAMIN D3) 1.25 MG (50000 UT) CAPS TK 1 C PO Q WK    . cyclobenzaprine (FLEXERIL) 10 MG tablet TK 1 T PO Q 8 H PRN FOR MUSCLE SPASMS    . lisinopril (PRINIVIL,ZESTRIL) 40 MG tablet Take 1 tablet (40 mg total) by mouth daily. 30 tablet 2  . lithium carbonate 150 MG capsule Alternate 2 capsules with 1 capsule every other night 135 capsule 1  . meloxicam (MOBIC) 15 MG tablet Take 15 mg by mouth daily.    Marland Kitchen PARoxetine (PAXIL) 20 MG tablet Take 1 tablet (20 mg total) by mouth daily. 90 tablet 1  . Pyridoxine HCl (VITAMIN B-6) 500 MG tablet Take 500 mg by mouth as needed.     . traZODone (DESYREL) 50 MG tablet Take 1 tablet (50 mg total) by mouth at bedtime. 90 tablet 1   No current facility-administered medications for this visit.    Family History  Problem Relation Age of Onset  . Cancer Mother        colon-stage 4  . Hypertension Mother   . Colon cancer Mother   . Other Father   . Breast cancer  Sister   . Colon polyps Neg Hx   . Esophageal cancer Neg Hx   . Rectal cancer Neg Hx   . Stomach cancer Neg Hx     Review of Systems  Exam:   LMP 09/24/1999      General appearance: alert, cooperative and appears stated age Head: Normocephalic, without obvious abnormality, atraumatic Neck: no adenopathy, supple, symmetrical, trachea midline and thyroid {EXAM; THYROID:18604} Lungs: clear to auscultation bilaterally Breasts: {Exam; breast:13139::"normal appearance, no masses or tenderness"} Heart: regular rate and rhythm Abdomen: soft, non-tender; bowel sounds normal; no masses,  no organomegaly Extremities: extremities normal, atraumatic, no cyanosis or edema Skin: Skin color, texture, turgor normal. No rashes or lesions Lymph nodes: Cervical, supraclavicular, and axillary nodes normal. No  abnormal inguinal nodes palpated Neurologic: Grossly normal   Pelvic: External genitalia:  no lesions              Urethra:  normal appearing urethra with no masses, tenderness or lesions              Bartholins and Skenes: normal                 Vagina: normal appearing vagina with normal color and discharge, no lesions              Cervix: {exam; cervix:14595}              Pap taken: {yes no:314532} Bimanual Exam:  Uterus:  {exam; uterus:12215}              Adnexa: {exam; adnexa:12223}               Rectovaginal: Confirms               Anus:  normal sphincter tone, no lesions  Chaperone, ***Terence Lux, CMA, was present for exam.  A:  Well Woman with normal exam  P:   {plan; gyn:5269::"mammogram","pap smear","return annually or prn"}

## 2020-04-25 ENCOUNTER — Ambulatory Visit: Payer: Medicare Other | Admitting: Obstetrics & Gynecology

## 2020-04-26 ENCOUNTER — Telehealth: Payer: Self-pay | Admitting: Psychiatry

## 2020-04-26 NOTE — Telephone Encounter (Signed)
Left message to call back tomorrow morning after 9:30 am and ask to speak with nurse.

## 2020-04-26 NOTE — Telephone Encounter (Signed)
Pt left message stating having a mental break down. Returned call, pt stated she was doing much better now. Need to see CC asap. Oct 4 first available and on canc list. Asking nurse to return call while fresh in her mind.The way she's feeling and was feeling. [phone]

## 2020-04-27 ENCOUNTER — Other Ambulatory Visit: Payer: Self-pay | Admitting: Psychiatry

## 2020-04-27 NOTE — Telephone Encounter (Signed)
PDMP shows:   01/17/2020  1   01/17/2020  Alprazolam 0.5 MG Tablet  30.00    I wonder if perhaps she took alprazolam before MD appt and got confused?  She clearly does not take it often.  If this recurs let us know ASAP.  We will keep her on cancellation list to get in ASAP.

## 2020-04-27 NOTE — Telephone Encounter (Signed)
Patient called back this morning to explain the situation from yesterday, she was scheduled for an apt with her GYN yesterday morning, she left her house to go but instead of getting to her apt, she circled around the same exit at least 5 times. She has no idea why, by the time she contacted her GYN she had missed her apt. She then goes home and realizes she had not showered, she wore clothing out that she would never wear outside the home, she was a complete mess. At that point she just starts crying and can't stop, her husband thought one of their children died by the way she was crying to much.   Since then she has calmed down and is feeling better. She has no idea what happen. I asked if she has noticed any changes lately with her behavior or mood, only reported she forgot her pocket book one day but other then that no. We discussed medications, she has had no changes since her last visit 12/2019. I instructed her to go to [facility] to get lithium level drawn, she has not been in a very long time. She agreed and will go today. She had previous order from 01/17/2020 for TSH, Lithium, and BMP  Informed her I would let you know situation and look for her lab results.

## 2020-04-28 LAB — BASIC METABOLIC PANEL
BUN: 22 mg/dL (ref 7–25)
CO2: 24 mmol/L (ref 20–32)
Calcium: 9.7 mg/dL (ref 8.6–10.4)
Chloride: 98 mmol/L (ref 98–110)
Creat: 0.92 mg/dL (ref 0.50–0.99)
Glucose, Bld: 127 mg/dL (ref 65–139)
Potassium: 4.1 mmol/L (ref 3.5–5.3)
Sodium: 128 mmol/L — ABNORMAL LOW (ref 135–146)

## 2020-04-28 LAB — LITHIUM LEVEL: Lithium Lvl: 0.5 mmol/L — ABNORMAL LOW (ref 0.6–1.2)

## 2020-04-28 LAB — TSH: TSH: 1.03 mIU/L (ref 0.40–4.50)

## 2020-05-02 NOTE — Telephone Encounter (Signed)
Left detailed message with information and to call back to discuss

## 2020-05-02 NOTE — Telephone Encounter (Signed)
Left patient another detailed message to call back about her labs.

## 2020-05-02 NOTE — Telephone Encounter (Signed)
Her sodium level is too low and that has nothing to do with lithium.  Possibly connected to paroxetine or other meds but she needs to get to PCP ASAP and have them evaluate.  If confusion occurs again go to ER.  She could have fainting spells with a sodium level this low.

## 2020-05-03 NOTE — Telephone Encounter (Signed)
Patient called back and aware to reach out to her PCP with her sodium level. Instructed her to call back with an update and if the office needed anything to let us know.

## 2020-05-05 ENCOUNTER — Other Ambulatory Visit: Payer: Self-pay | Admitting: Psychiatry

## 2020-05-05 ENCOUNTER — Ambulatory Visit: Payer: Self-pay | Admitting: Obstetrics & Gynecology

## 2020-05-05 ENCOUNTER — Telehealth: Payer: Self-pay | Admitting: Psychiatry

## 2020-05-05 DIAGNOSIS — F411 Generalized anxiety disorder: Secondary | ICD-10-CM

## 2020-05-05 DIAGNOSIS — E871 Hypo-osmolality and hyponatremia: Secondary | ICD-10-CM

## 2020-05-05 MED ORDER — ALPRAZOLAM 0.5 MG PO TABS: 0.5000 mg | ORAL_TABLET | Freq: Two times a day (BID) | ORAL | 0 refills | Status: DC | PRN

## 2020-05-05 NOTE — Telephone Encounter (Signed)
RTC  No unusual amounts of water.  Start 2 salt tablets daily  Reduce paroxetine to 10 mg daily. She asks to defer this  Move up appt with me.    Continued lithium.  She wants to try stopping the trazodone first as this would be easier to stop that in the paroxetine.  Trazodone can also cause low sodium levels.  Therefore we will stop trazodone and continue paroxetine though she is aware that statistically speaking it is more likely the paroxetine causing low sodium.  Lithium does not cause low sodium.  She is also aware that low sodium can cause episodes of confusion, and she has had 1, as well as other wrist like seizures.  Therefore it is important that we follow the sodium closely and see it get back into a normal range as soon as possible.  We will send request to [facility] for BMP next week.

## 2020-05-05 NOTE — Telephone Encounter (Signed)
Call her to move her appointment up with me and put her on the cancellation list.  Schedule next available.

## 2020-05-05 NOTE — Telephone Encounter (Signed)
Robin Barton called to get guidance on taking her Lithium.  Robin Barton advised her to go to her PCP.  She did and had labs done.  They showed low sodium which her Dr. Michela Barton was probably due to taking lithium.  You should have copies of those labs.  Robin Barton didn't take her Lithium last night. She wants to know if she should continue taking it or change dose, or what it is your advice on how to proceed.

## 2020-05-08 NOTE — Telephone Encounter (Signed)
Noted thank you

## 2020-05-09 NOTE — Progress Notes (Signed)
Info communicated about low sodium and repeat lab ordered

## 2020-05-12 LAB — BASIC METABOLIC PANEL
BUN: 16 mg/dL (ref 7–25)
CO2: 22 mmol/L (ref 20–32)
Calcium: 10 mg/dL (ref 8.6–10.4)
Chloride: 101 mmol/L (ref 98–110)
Creat: 0.86 mg/dL (ref 0.50–0.99)
Glucose, Bld: 95 mg/dL (ref 65–139)
Potassium: 4.9 mmol/L (ref 3.5–5.3)
Sodium: 133 mmol/L — ABNORMAL LOW (ref 135–146)

## 2020-05-12 NOTE — Progress Notes (Signed)
Left pt. A VM to return my call.

## 2020-05-12 NOTE — Progress Notes (Signed)
Spoke with patient and she is going in today to get her labs drawn

## 2020-05-15 ENCOUNTER — Other Ambulatory Visit: Payer: Self-pay | Admitting: Psychiatry

## 2020-05-15 DIAGNOSIS — E871 Hypo-osmolality and hyponatremia: Secondary | ICD-10-CM

## 2020-05-15 NOTE — Telephone Encounter (Signed)
Sodium level is much improved from 128 to 133 with time and stopping trazodone and adding salt tablets while continuing paroxetine.  Sodium level is still low but is not likely low enough to cause SE.  It still needs to be monitored.  Repeat blood test in 2 weeks.  No med changes required at this time.  Call if any symptoms change.

## 2020-05-16 NOTE — Telephone Encounter (Signed)
Spoke with her and she verbalized understanding.

## 2020-05-26 ENCOUNTER — Other Ambulatory Visit: Payer: Medicare Other

## 2020-05-26 ENCOUNTER — Other Ambulatory Visit: Payer: Self-pay

## 2020-05-26 DIAGNOSIS — Z20822 Contact with and (suspected) exposure to covid-19: Secondary | ICD-10-CM

## 2020-05-27 LAB — BASIC METABOLIC PANEL
BUN: 14 mg/dL (ref 7–25)
CO2: 23 mmol/L (ref 20–32)
Calcium: 10.3 mg/dL (ref 8.6–10.4)
Chloride: 99 mmol/L (ref 98–110)
Creat: 0.73 mg/dL (ref 0.50–0.99)
Glucose, Bld: 85 mg/dL (ref 65–139)
Potassium: 4.4 mmol/L (ref 3.5–5.3)
Sodium: 133 mmol/L — ABNORMAL LOW (ref 135–146)

## 2020-05-27 LAB — NOVEL CORONAVIRUS, NAA: SARS-CoV-2, NAA: NOT DETECTED

## 2020-05-31 NOTE — Progress Notes (Signed)
Patient made aware.

## 2020-05-31 NOTE — Progress Notes (Signed)
Sodium level9/3//21 is still better but same borderline low as last time which was better after stopping trazodone and taking salt tabs.  See previous lab note on this..  No immediate change necessary since no sx related.

## 2020-06-08 ENCOUNTER — Other Ambulatory Visit: Payer: Self-pay | Admitting: Internal Medicine

## 2020-06-08 DIAGNOSIS — E785 Hyperlipidemia, unspecified: Secondary | ICD-10-CM

## 2020-06-21 ENCOUNTER — Other Ambulatory Visit: Payer: Medicare Other

## 2020-06-23 NOTE — Progress Notes (Signed)
64 y.o. G51P0002 Married White or Caucasian female here for breast and pelvic exam.  I am also following her for h/o lobar carcinoma in situ in 2016.  Treated with lumpectomy.    Had episode of hypokalemia and confusion.  Now on oral potassium.  Level is improved.  Medications have been adjusted.  Followed by Dr Clovis Pu.  Pt reports liver enzymes were mildly elevated.  Dr. Jackelyn Poling is following this and had lab work on Friday.  Reports she stopped the statin last year and feels much better.  Reports lipids were better.  Coronary calcium test is scheduled for Friday.  Denies vaginal bleeding.  She's lost weight this past year.  Eats six small small meals a day.  Reports she is stable at her current weight.  She is walking and doing a lot of yard work.    Patient's last menstrual period was 09/24/1999.   Post menopausal       Sexually active: Yes.    H/O STD:  no  Health Maintenance: PCP:  Velna Hatchet.  Last wellness appt was 12/2019  Did blood work at that appt: yes Vaccines are up to date:  yes Colonoscopy:  03-15-2019 polyps.  Pt is planning on having follow up in 3 years. MMG:  12-31-17 category b density birads 1:neg, 05/2020 per patient will call for copy BMD:  12/2019 with pcp Last pap smear:  08-20-16 neg HPV HR neg, 02-18-2019 neg H/o abnormal pap smear:  no    reports that she has been smoking cigarettes. She has a 17.50 pack-year smoking history. She has never used smokeless tobacco. She reports current alcohol use of about 12.0 - 14.0 standard drinks of alcohol per week. She reports that she does not use drugs.  Past Medical History:  Diagnosis Date  . Acid reflux   . Anxiety   . Atypical lobular hyperplasia of left breast 11/25/13   lumpectomy, 12/10/13  . BCC (basal cell carcinoma)    per pt, never had cancer  . Depression   . Hyperlipidemia   . Hypertension   . Osteopenia   . Thyroid disease 2010   rt thyroid nodule    Past Surgical History:  Procedure Laterality Date   . BREAST LUMPECTOMY WITH RADIOACTIVE SEED LOCALIZATION Left 12/10/2013   Procedure: BREAST LUMPECTOMY WITH RADIOACTIVE SEED LOCALIZATION;  Surgeon: [name], MD;  Location: [place] [facility];  Service: General;  Laterality: Left;  . BUNIONECTOMY  2/13 left foot and toe straightened  . BUNIONECTOMY  3/13 right foot and toe straightened  . COLONOSCOPY    . NASAL SEPTUM SURGERY  1982    Current Outpatient Medications  Medication Sig Dispense Refill  . alendronate (FOSAMAX) 70 MG tablet once a week. Reported on 02/06/2016  0  . ALPRAZolam (XANAX) 0.5 MG tablet Take 1 tablet (0.5 mg total) by mouth 2 (two) times daily as needed. 45 tablet 0  . atenolol (TENORMIN) 25 MG tablet Take 1 tablet (25 mg total) by mouth daily. 30 tablet 2  . calcium carbonate (OS-CAL - DOSED IN MG OF ELEMENTAL CALCIUM) 1250 MG tablet Take 1 tablet by mouth daily.      . Cholecalciferol (VITAMIN D3) 1.25 MG (50000 UT) CAPS every 14 (fourteen) days.     . cyclobenzaprine (FLEXERIL) 10 MG tablet TK 1 T PO Q 8 H PRN FOR MUSCLE SPASMS    . lisinopril (PRINIVIL,ZESTRIL) 40 MG tablet Take 1 tablet (40 mg total) by mouth daily. 30 tablet 2  .  lithium carbonate 150 MG capsule Alternate 2 capsules with 1 capsule every other night 135 capsule 1  . PARoxetine (PAXIL) 20 MG tablet Take 1 tablet (20 mg total) by mouth daily. (Patient taking differently: Take 10 mg by mouth daily. ) 90 tablet 1  . Pyridoxine HCl (VITAMIN B-6) 500 MG tablet Take 500 mg by mouth as needed.     . SODIUM CHLORIDE PO Take by mouth.    . meloxicam (MOBIC) 15 MG tablet Take 15 mg by mouth daily. (Patient not taking: Reported on 06/26/2020)    . traZODone (DESYREL) 50 MG tablet Take 1 tablet (50 mg total) by mouth at bedtime. (Patient not taking: Reported on 06/26/2020) 90 tablet 1   No current facility-administered medications for this visit.    Family History  Problem Relation Age of Onset  . Cancer Mother        colon-stage 4  .  Hypertension Mother   . Colon cancer Mother   . Other Father   . Breast cancer Sister   . Colon polyps Neg Hx   . Esophageal cancer Neg Hx   . Rectal cancer Neg Hx   . Stomach cancer Neg Hx     Review of Systems  Constitutional: Negative.   HENT: Negative.   Eyes: Negative.   Respiratory: Negative.   Cardiovascular: Negative.   Gastrointestinal: Negative.   Endocrine: Negative.   Genitourinary: Negative.   Musculoskeletal: Negative.   Skin: Negative.   Allergic/Immunologic: Negative.   Neurological: Negative.   Hematological: Negative.   Psychiatric/Behavioral: Negative.     Exam:   BP 110/70   Pulse 68   Resp 16   Ht 5' 0.75" (1.543 m)   Wt 108 lb (49 kg)   LMP 09/24/1999   BMI 20.57 kg/m   Height: 5' 0.75" (154.3 cm)  General appearance: alert, cooperative and appears stated age Breasts: left breast with well healed scar, no masses, skin changes, LAD; right breast without masses, skin changes, nipple discharge, LAD Abdomen: soft, non-tender; bowel sounds normal; no masses,  no organomegaly Lymph nodes: Cervical, supraclavicular, and axillary nodes normal.  No abnormal inguinal nodes palpated Neurologic: Grossly normal  Pelvic: External genitalia:  no lesions              Urethra:  normal appearing urethra with no masses, tenderness or lesions              Bartholins and Skenes: normal                 Vagina: normal appearing vagina with normal color and discharge, no lesions              Cervix: no lesions              Pap taken: No. Bimanual Exam:  Uterus:  normal size, contour, position, consistency, mobility, non-tender              Adnexa: normal adnexa and no mass, fullness, tenderness               Rectovaginal: Confirms               Anus:  normal sphincter tone, no lesions  Chaperone, Royal Hawthorn, CMA, was present for exam.  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 Osteopenia Depression/anxiety Family hx of colon cancer in mother (early 73's).  Person hx  of adenomatous polyps.   P:   Mammogram guidelines reviewed.  Will get copy of this to review. pap smear not indicated Colonoscopy done 02/2019 Vaccines reviewed and up to date Lab work done 12/2019 Will get copy of BMD.  I feel pt probably can stop her alendronate.   Return annually or prn  26 minutes of total time was spent for this patient encounter, including preparation, face-to-face counseling with the patient and coordination of care, and documentation of the encounter.

## 2020-06-26 ENCOUNTER — Ambulatory Visit (INDEPENDENT_AMBULATORY_CARE_PROVIDER_SITE_OTHER): Payer: Medicare Other | Admitting: Psychiatry

## 2020-06-26 ENCOUNTER — Ambulatory Visit (INDEPENDENT_AMBULATORY_CARE_PROVIDER_SITE_OTHER): Payer: Medicare Other | Admitting: Obstetrics & Gynecology

## 2020-06-26 ENCOUNTER — Encounter: Payer: Self-pay | Admitting: Obstetrics & Gynecology

## 2020-06-26 ENCOUNTER — Other Ambulatory Visit: Payer: Self-pay

## 2020-06-26 ENCOUNTER — Encounter: Payer: Self-pay | Admitting: Psychiatry

## 2020-06-26 VITALS — BP 110/70 | HR 68 | Resp 16 | Ht 60.75 in | Wt 108.0 lb

## 2020-06-26 DIAGNOSIS — G251 Drug-induced tremor: Secondary | ICD-10-CM

## 2020-06-26 DIAGNOSIS — E871 Hypo-osmolality and hyponatremia: Secondary | ICD-10-CM

## 2020-06-26 DIAGNOSIS — F39 Unspecified mood [affective] disorder: Secondary | ICD-10-CM

## 2020-06-26 DIAGNOSIS — F5105 Insomnia due to other mental disorder: Secondary | ICD-10-CM

## 2020-06-26 DIAGNOSIS — D0502 Lobular carcinoma in situ of left breast: Secondary | ICD-10-CM

## 2020-06-26 DIAGNOSIS — Z9289 Personal history of other medical treatment: Secondary | ICD-10-CM | POA: Diagnosis not present

## 2020-06-26 DIAGNOSIS — F411 Generalized anxiety disorder: Secondary | ICD-10-CM

## 2020-06-26 DIAGNOSIS — Z79899 Other long term (current) drug therapy: Secondary | ICD-10-CM

## 2020-06-26 DIAGNOSIS — Z01419 Encounter for gynecological examination (general) (routine) without abnormal findings: Secondary | ICD-10-CM

## 2020-06-26 MED ORDER — PAROXETINE HCL 10 MG PO TABS: 10.0000 mg | ORAL_TABLET | Freq: Every day | ORAL | 1 refills | Status: DC

## 2020-06-26 MED ORDER — LITHIUM CARBONATE 150 MG PO CAPS: ORAL_CAPSULE | ORAL | 1 refills | Status: DC

## 2020-06-26 MED ORDER — ALPRAZOLAM 0.5 MG PO TABS: 0.5000 mg | ORAL_TABLET | Freq: Two times a day (BID) | ORAL | 5 refills | Status: DC | PRN

## 2020-06-26 NOTE — Patient Instructions (Signed)
[place], [place]: [phone] Behavioral Medicine: [phone] Fax: [phone] Hours: [place] (M, Th, F); 7am - 6pm (Tue & Wed)  Dr. Betty Martinique Dr. Grier Mitts

## 2020-06-26 NOTE — Progress Notes (Signed)
Robin Barton [id number] 12/19/1955 64 y.o.    Subjective:   Patient ID:  Robin Barton is a 64 y.o. (DOB Jul 11, 1956) female.  Chief Complaint:  Chief Complaint  Patient presents with  . Follow-up    Medication Management  . Other    mood disorder  . Hx hyponatremia    Depression        Associated symptoms include myalgias.  Associated symptoms include no decreased concentration and no suicidal ideas.  Robin Barton presents to the office today for follow-up mood and anxiety.  seen October 2020.  No meds were changed.  She was doing well psychiatrically.  It was recommended she get a lithium level and BMP and Continue paroxetine 20 mg daily, continue trazodone 50 mg nightly, continue lithium 150 mg alternating with 300 mg every other day.  There are still no lithium levels or BMP levels in epic though requested at last 2 visits.  January 17, 2020 appointment the following is noted: .Ok.  Exposed to virus twice and had to QT.  Had to hlep sister with med probvlems.   Forgot the labs.   Gotten vaccinated. Not taking Xanax.   Sleeps [place] with trazodone. Forgot to get labs after reducing the lithium.  Tremor is much better and manageable with the reduction of lithium.  No worsening of mood with the change. Tremor has gotten better on it's own.  No longer needs B6. Plan: Continue paroxetine 20 mg daily, continue trazodone 50 mg nightly, continue lithium 150 mg alternating with 300 mg every other day. Asks for alprazolam prn anxiety.  August 2021 it was discovered she had low sodium and was just recommended she discuss with PCP.  She stopped trazodone and her sodium level improved.  06/26/20 appt noted: Reduced paroxetine to 10 mg from 20 mg daily and stopped trazodone. Low sodium SE caused scary neuro sx.  Had gotten confused and was having tearful episodes.  Sx had developed gradually over time.   Balance issues.   I feel so much better.    Taking salt  tablet twice daily. BP is fine.   Emotionally feels good.  On a cleaning rampage at this time.    Lost a good friend to ALS.   Taking Xanax instead of trazodone for sleep.  Patient reports stable mood and denies depressed or irritable moods.  Still feels anxious in AM.  Drinks coffee.  .  Patient denies difficulty with sleep initiation or maintenance with trazodone. Denies appetite disturbance.  Patient reports that energy and motivation have been good.  Patient denies any difficulty with concentration.  Patient denies any suicidal ideation.  Past Psychiatric Medication Trials: paroxetine, lithium Xanax, trazodone  Review of Systems:  Review of Systems  Musculoskeletal: Positive for myalgias.  Neurological: Positive for tremors. Negative for dizziness and weakness.  Psychiatric/Behavioral: Positive for depression. Negative for agitation, behavioral problems, confusion, decreased concentration, dysphoric mood, hallucinations, self-injury, sleep disturbance and suicidal ideas. The patient is not nervous/anxious and is not hyperactive.     Medications: I have reviewed the patient's current medications.  Current Outpatient Medications  Medication Sig Dispense Refill  . ALPRAZolam (XANAX) 0.5 MG tablet Take 1 tablet (0.5 mg total) by mouth 2 (two) times daily as needed. 45 tablet 5  . atenolol (TENORMIN) 25 MG tablet Take 1 tablet (25 mg total) by mouth daily. 30 tablet 2  . calcium carbonate (OS-CAL - DOSED IN MG OF ELEMENTAL CALCIUM) 1250 MG tablet Take 1 tablet by mouth  daily.      . Cholecalciferol (VITAMIN D3) 1.25 MG (50000 UT) CAPS every 14 (fourteen) days.     . cyclobenzaprine (FLEXERIL) 10 MG tablet TK 1 T PO Q 8 H PRN FOR MUSCLE SPASMS    . lisinopril (PRINIVIL,ZESTRIL) 40 MG tablet Take 1 tablet (40 mg total) by mouth daily. 30 tablet 2  . lithium carbonate 150 MG capsule Alternate 2 capsules with 1 capsule every other night 135 capsule 1  . PARoxetine (PAXIL) 10 MG tablet Take 1  tablet (10 mg total) by mouth daily. 90 tablet 1  . Pyridoxine HCl (VITAMIN B-6) 500 MG tablet Take 500 mg by mouth as needed.     . SODIUM CHLORIDE PO Take by mouth.     No current facility-administered medications for this visit.    Medication Side Effects: Other: tremor recently better  Allergies:  Allergies  Allergen Reactions  . Sulfonamide Derivatives Hives, Itching and Rash    Past Medical History:  Diagnosis Date  . Acid reflux   . Anxiety   . Atypical lobular hyperplasia of left breast 11/25/13   lumpectomy, 12/10/13  . BCC (basal cell carcinoma)    per pt, never had cancer  . Depression   . Hyperlipidemia   . Hypertension   . Osteopenia   . Thyroid disease 2010   rt thyroid nodule    Family History  Problem Relation Age of Onset  . Cancer Mother        colon-stage 4  . Hypertension Mother   . Colon cancer Mother   . Other Father   . Breast cancer Sister   . Colon polyps Neg Hx   . Esophageal cancer Neg Hx   . Rectal cancer Neg Hx   . Stomach cancer Neg Hx     Social History   Socioeconomic History  . Marital status: Married    Spouse name: Not on file  . Number of children: 2  . Years of education: Not on file  . Highest education level: Not on file  Occupational History  . Not on file  Tobacco Use  . Smoking status: Current Some Day Smoker    Packs/day: 0.50    Years: 35.00    Pack years: 17.50    Types: Cigarettes  . Smokeless tobacco: Never Used  Vaping Use  . Vaping Use: Former  Substance and Sexual Activity  . Alcohol use: Yes    Alcohol/week: 12.0 - 14.0 standard drinks    Types: 12 - 14 Glasses of wine per week  . Drug use: No  . Sexual activity: Yes    Partners: Male    Birth control/protection: Post-menopausal  Other Topics Concern  . Not on file  Social History Narrative  . Not on file   Social Determinants of Health   Financial Resource Strain:   . Difficulty of Paying Living Expenses: Not on file  Food Insecurity:    . Worried About Charity fundraiser in the Last Year: Not on file  . Ran Out of Food in the Last Year: Not on file  Transportation Needs:   . Lack of Transportation (Medical): Not on file  . Lack of Transportation (Non-Medical): Not on file  Physical Activity:   . Days of Exercise per Week: Not on file  . Minutes of Exercise per Session: Not on file  Stress:   . Feeling of Stress : Not on file  Social Connections:   . Frequency of Communication with Friends  and Family: Not on file  . Frequency of Social Gatherings with Friends and Family: Not on file  . Attends Religious Services: Not on file  . Active Member of Clubs or Organizations: Not on file  . Attends Archivist Meetings: Not on file  . Marital Status: Not on file  Intimate Partner Violence:   . Fear of Current or Ex-Partner: Not on file  . Emotionally Abused: Not on file  . Physically Abused: Not on file  . Sexually Abused: Not on file    Past Medical History, Surgical history, Social history, and Family history were reviewed and updated as appropriate.   Please see review of systems for further details on the patient's review from today.   Objective:   Physical Exam:  LMP 09/24/1999   Physical Exam Constitutional:      General: She is not in acute distress.    Appearance: She is well-developed.  Musculoskeletal:        General: No deformity.  Neurological:     Mental Status: She is alert and oriented to person, place, and time.     Cranial Nerves: No dysarthria.     Coordination: Coordination normal.  Psychiatric:        Attention and Perception: Attention and perception normal. She does not perceive auditory or visual hallucinations.        Mood and Affect: Mood normal. Mood is not anxious or depressed. Affect is not labile, blunt, angry or inappropriate.        Speech: Speech normal. Speech is not rapid and pressured or slurred.        Behavior: Behavior normal. Behavior is cooperative.         Thought Content: Thought content normal. Thought content is not paranoid or delusional. Thought content does not include homicidal or suicidal ideation. Thought content does not include homicidal or suicidal plan.        Cognition and Memory: Cognition and memory normal.        Judgment: Judgment normal.     Comments: Insight good No evidence for cognitive issues.     Lab Review:     Component Value Date/Time   NA 133 (L) 05/26/2020 1631   NA 139 07/19/2014 0903   K 4.4 05/26/2020 1631   K 4.7 07/19/2014 0903   CL 99 05/26/2020 1631   CO2 23 05/26/2020 1631   CO2 24 07/19/2014 0903   GLUCOSE 85 05/26/2020 1631   GLUCOSE 95 07/19/2014 0903   BUN 14 05/26/2020 1631   BUN 22.8 07/19/2014 0903   CREATININE 0.73 05/26/2020 1631   CREATININE 0.8 07/19/2014 0903   CALCIUM 10.3 05/26/2020 1631   CALCIUM 9.6 07/19/2014 0903   PROT 7.3 07/19/2014 0903   ALBUMIN 4.1 07/19/2014 0903   AST 23 07/19/2014 0903   ALT 21 07/19/2014 0903   ALKPHOS 54 07/19/2014 0903   BILITOT 0.49 07/19/2014 0903   GFRNONAA 53 (L) 06/18/2018 0934   GFRAA 61 06/18/2018 0934       Component Value Date/Time   WBC 5.0 07/19/2014 0903   WBC 5.3 04/05/2011 0923   RBC 3.94 07/19/2014 0903   RBC 4.05 04/05/2011 0923   HGB 12.5 07/19/2014 0903   HCT 37.8 07/19/2014 0903   PLT 370 07/19/2014 0903   MCV 96.0 07/19/2014 0903   MCH 31.8 07/19/2014 0903   MCH 31.9 04/05/2011 0923   MCHC 33.1 07/19/2014 0903   MCHC 33.2 04/05/2011 0923   RDW 12.8 07/19/2014 0903  LYMPHSABS 1.5 07/19/2014 0903   MONOABS 0.6 07/19/2014 0903   EOSABS 0.1 07/19/2014 0903   BASOSABS 0.0 07/19/2014 0903    Lithium Lvl  Date Value Ref Range Status  04/27/2020 0.5 (L) 0.6 - 1.2 mmol/L Final     No results found for: PHENYTOIN, PHENOBARB, VALPROATE, CBMZ   .res Assessment: Plan:    Generalized anxiety disorder - Plan: ALPRAZolam (XANAX) 0.5 MG tablet, PARoxetine (PAXIL) 10 MG tablet  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  Overall she remained stable after the reduction in both lithium and paroxetine to current dosages.  No indication for any changes.  We discussed her history of response to lithium at the lower dose 150 and then the 300 mg a day.  We discussed her side effects of tremor.  It's better now.   The tremor is better with the reduction to the current dose of lithium and the use of B6.  It is now manageable.  Disc option stopping B6 to see if she still needs it. Last lithium level is low normal.  We discussed the question of her diagnosis given at prior history of 2 weeks of hypomania but that may have been related to medications.  The uncertainty whether she truly has major depression versus bipolar disorder remains.  We discussed the risk of antidepressants causing mood cycling and the advantage to being on a lower dose.  Repeat Sodium before the end of the year.  Recent hyponatremia in August as low as 128 improved to 133 after stopping trazodone and adding salt tablets. Repeated twice.  No med changes today. Continue lower paroxetine 10 mg daily,  continue lithium 150 mg alternating with 300 mg every other day.  Continue salt tablets Stopped trazodone DT hyponatremia.  Asks for alprazolam prn anxiety or insomnia since off the Xanax..  Follow-up sooner 3 months bc of recent concerns  Lynder Parents MD, DFAPA\  Future Appointments  Date Time Provider [place]  07/05/2020 10:30 AM [facility] 1 [facility]    Orders Placed This Encounter  Procedures  . Basic metabolic panel      -------------------------------

## 2020-07-05 ENCOUNTER — Ambulatory Visit
Admission: RE | Admit: 2020-07-05 | Discharge: 2020-07-05 | Disposition: A | Discharge status: Home / Self Care | Payer: Medicare Other | Source: Ambulatory Visit | Attending: Internal Medicine | Admitting: Internal Medicine

## 2020-07-05 DIAGNOSIS — E785 Hyperlipidemia, unspecified: Secondary | ICD-10-CM

## 2020-07-18 ENCOUNTER — Ambulatory Visit: Payer: Medicare Other | Admitting: Psychiatry

## 2020-07-27 ENCOUNTER — Other Ambulatory Visit: Payer: Self-pay

## 2020-07-27 ENCOUNTER — Encounter: Payer: Self-pay | Admitting: Cardiology

## 2020-07-27 ENCOUNTER — Ambulatory Visit: Payer: Medicare Other | Admitting: Cardiology

## 2020-07-27 VITALS — BP 136/70 | HR 55 | Ht 60.75 in | Wt 109.0 lb

## 2020-07-27 DIAGNOSIS — I10 Essential (primary) hypertension: Secondary | ICD-10-CM

## 2020-07-27 DIAGNOSIS — Z720 Tobacco use: Secondary | ICD-10-CM | POA: Diagnosis not present

## 2020-07-27 DIAGNOSIS — E7800 Pure hypercholesterolemia, unspecified: Secondary | ICD-10-CM

## 2020-07-27 DIAGNOSIS — I2510 Atherosclerotic heart disease of native coronary artery without angina pectoris: Secondary | ICD-10-CM | POA: Diagnosis not present

## 2020-07-27 MED ORDER — ROSUVASTATIN CALCIUM 20 MG PO TABS
20.0000 mg | ORAL_TABLET | Freq: Every day | ORAL | 3 refills | Status: DC
Start: 2020-07-27 — End: 2021-09-19

## 2020-07-27 MED ORDER — CARVEDILOL 3.125 MG PO TABS
3.1250 mg | ORAL_TABLET | Freq: Two times a day (BID) | ORAL | 3 refills | Status: DC
Start: 2020-07-27 — End: 2021-11-06

## 2020-07-27 NOTE — Progress Notes (Signed)
Cardiology Office Note:    Date:  07/27/2020   ID:  Robin Barton, DOB 1956/04/05, MRN [id number]  PCP:  Velna Hatchet, MD  [facility] Cardiologist:  No primary care provider on file.  [facility] Electrophysiologist:  None   Referring MD: Velna Hatchet, MD     History of Present Illness:    Robin Barton is a 64 y.o. female with a hx of GERD, anxiety, depression, HLD and HTN who was referred by Dr. Ardeth Perfect for elevated coronary calcium score.  Patient states she is asymptomatic. She is very active with no exercise limitations. No chest pain, SOB, LE edema, orthopnea, PND. She continues to smoke 1/2ppd, but has successfully quit in the past and is motivated to do it again. Other risk factors include HTN and HLD. She does not monitor her blood pressure at home.  The observed calcium score of 702 is at the percentile 97 for subjects of the same age, gender and race/ethnicity who are free of clinical cardiovascular disease and treated diabetes. Left Main: 0 LAD: 274 LCx: 177 RCA: 250 Total Agatston Score: 702   Past Medical History:  Diagnosis Date   Acid reflux    Anxiety    Atypical lobular hyperplasia of left breast 11/25/13   lumpectomy, 12/10/13   BCC (basal cell carcinoma)    per pt, never had cancer   Depression    Hyperlipidemia    Hypertension    Osteopenia    Thyroid disease 2010   rt thyroid nodule    Past Surgical History:  Procedure Laterality Date   BREAST LUMPECTOMY WITH RADIOACTIVE SEED LOCALIZATION Left 12/10/2013   Procedure: BREAST LUMPECTOMY WITH RADIOACTIVE SEED LOCALIZATION;  Surgeon: [name], MD;  Location: [facility];  Service: General;  Laterality: Left;   BUNIONECTOMY  2/13 left foot and toe straightened   BUNIONECTOMY  3/13 right foot and toe straightened   COLONOSCOPY     NASAL SEPTUM SURGERY  1982    Current Medications: Current Meds  Medication Sig   ALPRAZolam (XANAX) 0.5  MG tablet Take 1 tablet (0.5 mg total) by mouth 2 (two) times daily as needed.   aspirin EC 81 MG tablet Take 81 mg by mouth daily. Swallow whole.   Cholecalciferol (VITAMIN D3) 1.25 MG (50000 UT) CAPS every 14 (fourteen) days.    cyclobenzaprine (FLEXERIL) 10 MG tablet TK 1 T PO Q 8 H PRN FOR MUSCLE SPASMS   lisinopril (PRINIVIL,ZESTRIL) 40 MG tablet Take 1 tablet (40 mg total) by mouth daily.   lithium carbonate 150 MG capsule Alternate 2 capsules with 1 capsule every other night   PARoxetine (PAXIL) 10 MG tablet Take 1 tablet (10 mg total) by mouth daily.   Pyridoxine HCl (VITAMIN B-6) 500 MG tablet Take 500 mg by mouth as needed.    SODIUM CHLORIDE PO Take by mouth.   [DISCONTINUED] atenolol (TENORMIN) 25 MG tablet Take 1 tablet (25 mg total) by mouth daily.   [DISCONTINUED] calcium carbonate (OS-CAL - DOSED IN MG OF ELEMENTAL CALCIUM) 1250 MG tablet Take 1 tablet by mouth daily.       Allergies:   Sulfonamide derivatives   Social History   Socioeconomic History   Marital status: Married    Spouse name: Not on file   Number of children: 2   Years of education: Not on file   Highest education level: Not on file  Occupational History   Not on file  Tobacco Use   Smoking  status: Current Some Day Smoker    Packs/day: 0.50    Years: 35.00    Pack years: 17.50    Types: Cigarettes   Smokeless tobacco: Never Used  Vaping Use   Vaping Use: Former  Substance and Sexual Activity   Alcohol use: Yes    Alcohol/week: 12.0 - 14.0 standard drinks    Types: 12 - 14 Glasses of wine per week   Drug use: No   Sexual activity: Yes    Partners: Male    Birth control/protection: Post-menopausal  Other Topics Concern   Not on file  Social History Narrative   Not on file   Social Determinants of Health   Financial Resource Strain:    Difficulty of Paying Living Expenses: Not on file  Food Insecurity:    Worried About Charity fundraiser in the Last Year: Not  on file   [employer] of Food in the Last Year: Not on file  Transportation Needs:    Lack of Transportation (Medical): Not on file   Lack of Transportation (Non-Medical): Not on file  Physical Activity:    Days of Exercise per Week: Not on file   Minutes of Exercise per Session: Not on file  Stress:    Feeling of Stress : Not on file  Social Connections:    Frequency of Communication with Friends and Family: Not on file   Frequency of Social Gatherings with Friends and Family: Not on file   Attends Religious Services: Not on file   Active Member of Clubs or Organizations: Not on file   Attends Archivist Meetings: Not on file   Marital Status: Not on file     Family History: The patient's family history includes Breast cancer in her sister; Cancer in her mother; Colon cancer in her mother; Hypertension in her mother; Other in her father. There is no history of Colon polyps, Esophageal cancer, Rectal cancer, or Stomach cancer.  ROS:   Please see the history of present illness.    Review of Systems  Constitutional: Negative for chills, fever and malaise/fatigue.  HENT: Negative for hearing loss.   Eyes: Negative for blurred vision.  Respiratory: Negative for cough and shortness of breath.   Cardiovascular: Negative for chest pain, palpitations, orthopnea, claudication, leg swelling and PND.  Gastrointestinal: Negative for nausea and vomiting.  Genitourinary: Negative for hematuria.  Musculoskeletal: Negative for falls.  Neurological: Negative for dizziness and loss of consciousness.  Endo/Heme/Allergies: Does not bruise/bleed easily.  Psychiatric/Behavioral: Negative for substance abuse.    EKGs/Labs/Other Studies Reviewed:    The following studies were reviewed today: FINDINGS: CORONARY CALCIUM SCORES:  Left Main: 0  LAD: 274  LCx: 177  RCA: 250  Total Agatston Score: 702  MESA database percentile: 97  AORTA MEASUREMENTS:  Ascending  Aorta: 31 mm  Descending Aorta: 23 mm  OTHER FINDINGS:  Heart is normal size. No adenopathy. Visualized lungs clear. No effusions. Imaging into the upper abdomen demonstrates no acute findings. Chest wall soft tissues are unremarkable. No acute bony abnormality.  IMPRESSION: The observed calcium score of 702 is at the percentile 97 for subjects of the same age, gender and race/ethnicity who are free of clinical cardiovascular disease and treated diabetes.  No acute or significant extracardiac abnormality   EKG:  EKG is ordered today.  The ekg ordered today demonstrates Sinus bradycardia with HR 55. Minor PR depression inferiorly. TWI in I and aVL. Relatively unchanged from prior in 2015.  Recent Labs:  04/27/2020: TSH 1.03 05/26/2020: BUN 14; Creat 0.73; Potassium 4.4; Sodium 133  Recent Lipid Panel    Component Value Date/Time   CHOL 186 07/19/2014 0903   TRIG 40 07/19/2014 0903   HDL 98 07/19/2014 0903   CHOLHDL 1.9 07/19/2014 0903   VLDL 8 07/19/2014 0903   LDLCALC 80 07/19/2014 0903      Physical Exam:    VS:  BP 136/70    Pulse (!) 55    Ht 5' 0.75" (1.543 m)    Wt 109 lb (49.4 kg)    LMP 09/24/1999    SpO2 96%    BMI 20.77 kg/m     Wt Readings from Last 3 Encounters:  07/27/20 109 lb (49.4 kg)  06/26/20 108 lb (49 kg)  02/23/20 116 lb 9.6 oz (52.9 kg)     GEN:  Well nourished, well developed in no acute distress HEENT: Normal NECK: No JVD; No carotid bruits CARDIAC: RRR, no murmurs, rubs, gallops RESPIRATORY:  Clear to auscultation without rales, wheezing or rhonchi  ABDOMEN: Soft, non-tender, non-distended MUSCULOSKELETAL:  No edema; No deformity  SKIN: Warm and dry NEUROLOGIC:  Alert and oriented x 3 PSYCHIATRIC:  Normal affect   ASSESSMENT:    1. Coronary artery disease involving native coronary artery of native heart without angina pectoris   2. Primary hypertension   3. Pure hypercholesterolemia   4. Tobacco abuse    PLAN:    In order of  problems listed above:  #Multivessel CAD: #Coronary calcification: Patient with coronary calcium score 702 (97% for age, gender, race matched controls). Calcium noted in all three coronary arteries consistent with multivessel CAD. No symptoms and patient is very active without limitations. ECG without evidence of ischemia or infarct. Risk factors include tobacco use, HTN, HLD and age. -Continue ASA 81mg  daily -Increase crestor to 20mg  daily -Stop atenolol and start coreg 3.125mg  BID -Continue lisinopril 40mg  daily -Discussed that she needs to stop tobacco use as this will signficantly decreased her risk of progression of CVD and is the single best thing she can do for her health. She is motivated to quit. -Will check TTE given degree of disease to assess EF  #HTN: Managed by PCP. -Stop atenolol; start coreg 3.125mg  BID -Continue lisinopril 40mg  daily -Monitor at home with goal BP<120s/80s  #HLD: -Merits high dose statin give degree of CAD on CT scan; will increase crestor to 20mg  daily -Follow-up with PCP for continued monitoring  #Tobacco use: -Extensive time spent today about tobacco cessation to decrease risk of progression of CVD. Patient is motivated to quit and has quit in past. She is going to continue to try to do so today.   Medication Adjustments/Labs and Tests Ordered: Current medicines are reviewed at length with the patient today.  Concerns regarding medicines are outlined above.  Orders Placed This Encounter  Procedures   ECHOCARDIOGRAM COMPLETE   Meds ordered this encounter  Medications   carvedilol (COREG) 3.125 MG tablet    Sig: Take 1 tablet (3.125 mg total) by mouth 2 (two) times daily.    Dispense:  180 tablet    Refill:  3   rosuvastatin (CRESTOR) 20 MG tablet    Sig: Take 1 tablet (20 mg total) by mouth daily.    Dispense:  90 tablet    Refill:  3    Patient Instructions  Medication Instructions:  Your physician has recommended you make the  following change in your medication:  1-STOP Atenolol 2-START Carvedilol 3.125 mg by  mouth twice daily 3-INCREASE Crestor 20 mg by mouth daily.  *If you need a refill on your cardiac medications before your next appointment, please call your pharmacy*  Lab Work: If you have labs (blood work) drawn today and your tests are completely normal, you will receive your results only by:  [place] (if you have MyChart) OR  A paper copy in the mail If you have any lab test that is abnormal or we need to change your treatment, we will call you to review the results.  Testing/Procedures: Your physician has requested that you have an echocardiogram. Echocardiography is a painless test that uses sound waves to create images of your heart. It provides your doctor with information about the size and shape of your heart and how well your hearts chambers and valves are working. This procedure takes approximately one hour. There are no restrictions for this procedure.  Follow-Up: At [facility], you and your health needs are our priority.  As part of our continuing mission to provide you with exceptional heart care, we have created designated Provider Care Teams.  These Care Teams include your primary Cardiologist (physician) and Advanced Practice Providers (APPs -  Physician Assistants and Nurse Practitioners) who all work together to provide you with the care you need, when you need it.  We recommend signing up for the patient portal called "MyChart".  Sign up information is provided on this After Visit Summary.  MyChart is used to connect with patients for Virtual Visits (Telemedicine).  Patients are able to view lab/test results, encounter notes, upcoming appointments, etc.  Non-urgent messages can be sent to your provider as well.   To learn more about what you can do with MyChart, go to [email or web address].    Your next appointment:   3 month(s)  The format for your next appointment:     In Person  Provider:   You may see Dr. Johney Barton or one of the following Advanced Practice Providers on your designated Care Team:    Robin Dopp, PA-C  Robin Barton, [place]        Signed, Freada Bergeron, MD  07/27/2020 11:36 AM    [place]

## 2020-07-27 NOTE — Patient Instructions (Signed)
Medication Instructions:  Your physician has recommended you make the following change in your medication:  1-STOP Atenolol 2-START Carvedilol 3.125 mg by mouth twice daily 3-INCREASE Crestor 20 mg by mouth daily.  *If you need a refill on your cardiac medications before your next appointment, please call your pharmacy*  Lab Work: If you have labs (blood work) drawn today and your tests are completely normal, you will receive your results only by:  [place] (if you have MyChart) OR  A paper copy in the mail If you have any lab test that is abnormal or we need to change your treatment, we will call you to review the results.  Testing/Procedures: Your physician has requested that you have an echocardiogram. Echocardiography is a painless test that uses sound waves to create images of your heart. It provides your doctor with information about the size and shape of your heart and how well your hearts chambers and valves are working. This procedure takes approximately one hour. There are no restrictions for this procedure.  Follow-Up: At [facility], you and your health needs are our priority.  As part of our continuing mission to provide you with exceptional heart care, we have created designated Provider Care Teams.  These Care Teams include your primary Cardiologist (physician) and Advanced Practice Providers (APPs -  Physician Assistants and Nurse Practitioners) who all work together to provide you with the care you need, when you need it.  We recommend signing up for the patient portal called "MyChart".  Sign up information is provided on this After Visit Summary.  MyChart is used to connect with patients for Virtual Visits (Telemedicine).  Patients are able to view lab/test results, encounter notes, upcoming appointments, etc.  Non-urgent messages can be sent to your provider as well.   To learn more about what you can do with MyChart, go to [email or web address].    Your next  appointment:   3 month(s)  The format for your next appointment:   In Person  Provider:   You may see Dr. Johney Frame or one of the following Advanced Practice Providers on your designated Care Team:    Richardson Dopp, PA-C  [place], [place]

## 2020-07-29 ENCOUNTER — Other Ambulatory Visit: Payer: Self-pay | Admitting: Psychiatry

## 2020-07-29 DIAGNOSIS — F39 Unspecified mood [affective] disorder: Secondary | ICD-10-CM

## 2020-07-29 DIAGNOSIS — F411 Generalized anxiety disorder: Secondary | ICD-10-CM

## 2020-07-31 NOTE — Addendum Note (Signed)
Addended by: Gaetano Net on: 07/31/2020 07:19 AM   Modules accepted: Orders

## 2020-08-23 ENCOUNTER — Other Ambulatory Visit: Payer: Self-pay

## 2020-08-23 ENCOUNTER — Ambulatory Visit (HOSPITAL_COMMUNITY): Payer: Medicare Other | Attending: Cardiovascular Disease

## 2020-08-23 DIAGNOSIS — I2510 Atherosclerotic heart disease of native coronary artery without angina pectoris: Secondary | ICD-10-CM

## 2020-08-23 LAB — ECHOCARDIOGRAM COMPLETE
Area-P 1/2: 3.76 cm2
S' Lateral: 2.6 cm

## 2020-09-13 ENCOUNTER — Telehealth: Payer: Self-pay | Admitting: Gastroenterology

## 2020-09-13 NOTE — Telephone Encounter (Signed)
Spoke with patient, she states that today she had a BM - soft stool, patient noticed BRB blood when she wiped, no straining with bowel movement, pt denies any abdominal pain, has had hemorrhoids about 10 years ago, no rectal pain or discomfort. Advised that the area would need to be assessed to see if she has developed hemorrhoids or possibly a fissure. Pt also states that she is having repeat colon 7 years after her last one, pt states that she would feel more comfortable with having a colonoscopy every 3 years due to family history - mother was diagnosed with colon cancer at age 10 per patient. Patient has been scheduled to see Carl Best on Monday, 09/18/2020 at 11 AM for rectal bleeding. Please advise on timing of recall colon. Thank you.

## 2020-09-13 NOTE — Telephone Encounter (Signed)
More than likely this is benign anorectal bleeding. She had a colonoscopy last year without anything concerning noted. At this point in her life her mother's history does not influence her frequency of exams. She can discuss this further with Colleen at the visit. If she has any significant bleeding in the interim she can let us know. She can try some preparation H in the interim, etc, to see if that helps. thanks

## 2020-09-13 NOTE — Telephone Encounter (Signed)
Lm on vm for patient to return call 

## 2020-09-18 ENCOUNTER — Encounter: Payer: Self-pay | Admitting: Nurse Practitioner

## 2020-09-18 ENCOUNTER — Ambulatory Visit: Payer: Medicare Other | Admitting: Nurse Practitioner

## 2020-09-18 VITALS — BP 100/58 | HR 60 | Ht 60.75 in | Wt 110.4 lb

## 2020-09-18 DIAGNOSIS — K644 Residual hemorrhoidal skin tags: Secondary | ICD-10-CM

## 2020-09-18 DIAGNOSIS — K625 Hemorrhage of anus and rectum: Secondary | ICD-10-CM | POA: Diagnosis not present

## 2020-09-18 DIAGNOSIS — Z86010 Personal history of colonic polyps: Secondary | ICD-10-CM

## 2020-09-18 NOTE — Progress Notes (Signed)
Agree with assessment and plan as outlined.  

## 2020-09-18 NOTE — Progress Notes (Signed)
09/18/2020 Robin Barton [id number] Oct 10, 1955   Chief Complaint: Rectal bleeding  History of Present Illness: Robin Barton. Corney is a 64 year old female with a past medical history of anxiety, depression, hypertension, thyroid nodules, acid reflux, IBS symptoms and colon polyps.  She presents to our office today for further evaluation regarding rectal bleeding.  Approximately 1 week ago, she passed a normal bowel movement and she saw a small amount of bright red blood in the toilet tissue.  Three days later she passed a similar bowel movement with less bright red blood on the toilet tissue.  Her mother was diagnosed with colon cancer at the age of 76 and she is very concerned regarding her risk of colorectal cancer.  She denied having any straining or difficult bowel movement on the days she saw the blood in the toilet tissue.  No associated anal rectal discomfort.  She eats a fairly healthy diet.  She is well-hydrated.  History of colon polyps.  Her most recent colonoscopy was 03/15/2019 and 2 diminutive tubular adenomatous polyps were removed at the hepatic flexure and from the transverse colon.  She was advised by Dr. Adela Lank to repeat a colonoscopy in 7 years, however, the patient wishes to repeat a colonoscopy in 3 years.  She also reports having irritable bowel symptoms.  She describes having urgent diarrhea if she eats lunch away from home.  No specific food triggers.  No bloody diarrhea or associated abdominal pain.  Newest medication is carvedilol which she started early November 2021.  No recent antibiotics.   Colonoscopy 03/15/2019: - One diminutive polyp in the transverse colon, removed with a cold snare. Resected and retrieved. - One diminutive polyp at the hepatic flexure, removed with a cold snare. Resected and retrieved. - Tortuous colon. - The examination was otherwise normal. - TUBULAR ADENOMA (1 OF 2 FRAGMENTS) - BENIGN COLONIC MUCOSA (1 OF 2 FRAGMENTS) - NO  HIGH GRADE DYSPLASIA OR MALIGNANCY IDENTIFIED  Current Outpatient Medications on File Prior to Visit  Medication Sig Dispense Refill  . ALPRAZolam (XANAX) 0.5 MG tablet Take 1 tablet (0.5 mg total) by mouth 2 (two) times daily as needed. 45 tablet 5  . aspirin EC 81 MG tablet Take 81 mg by mouth daily. Swallow whole.    . carvedilol (COREG) 3.125 MG tablet Take 1 tablet (3.125 mg total) by mouth 2 (two) times daily. 180 tablet 3  . Cholecalciferol (VITAMIN D3) 1.25 MG (50000 UT) CAPS every 14 (fourteen) days.     . cyclobenzaprine (FLEXERIL) 10 MG tablet TK 1 T PO Q 8 H PRN FOR MUSCLE SPASMS    . lisinopril (PRINIVIL,ZESTRIL) 40 MG tablet Take 1 tablet (40 mg total) by mouth daily. 30 tablet 2  . lithium carbonate 150 MG capsule Alternate 2 capsules with 1 capsule every other night 135 capsule 1  . PARoxetine (PAXIL) 10 MG tablet Take 1 tablet (10 mg total) by mouth daily. 90 tablet 1  . Pyridoxine HCl (VITAMIN B-6) 500 MG tablet Take 500 mg by mouth as needed.     . rosuvastatin (CRESTOR) 20 MG tablet Take 1 tablet (20 mg total) by mouth daily. 90 tablet 3  . SODIUM CHLORIDE PO Take by mouth.     No current facility-administered medications on file prior to visit.   Allergies  Allergen Reactions  . Sulfonamide Derivatives Hives, Itching and Rash     Current Medications, Allergies, Past Medical History, Past Surgical History, Family History and Social History were reviewed  in [employer] record.   Review of Systems:   Constitutional: Negative for fever, sweats, chills or weight loss.  Respiratory: Negative for shortness of breath.   Cardiovascular: Negative for chest pain, palpitations and leg swelling.  Gastrointestinal: See HPI.  Musculoskeletal: Negative for back pain or muscle aches.  Neurological: Negative for dizziness, headaches or paresthesias.    Physical Exam: BP (!) 100/58   Pulse 60   Ht 5' 0.75" (1.543 m)   Wt 110 lb 6.4 oz (50.1 kg)   LMP  09/24/1999   BMI 21.03 kg/m  General: Well developed 64 year old female in no acute distress. Head: Normocephalic and atraumatic. Eyes: No scleral icterus. Conjunctiva pink . Ears: Normal auditory acuity. Mouth: Dentition intact. No ulcers or lesions.  Lungs: Clear throughout to auscultation. Heart: Regular rate and rhythm, no murmur. Abdomen: Soft, nontender and nondistended. No masses or hepatomegaly. Normal bowel sounds x 4 quadrants.  Rectal: Small non inflamed anterior external hemorrhoids.  No significant internal hemorrhoids palpated.  No blood or stool in the rectal vault.  No mass. Musculoskeletal: Symmetrical with no gross deformities. Extremities: No edema. Neurological: Alert oriented x 4. No focal deficits.  Psychological: Alert and cooperative. Normal mood and affect  Assessment and Recommendations:  33.  64 year old female with a history of tubular adenomatous polyps presents for further evaluation regarding rectal bleeding.  On rectal exam, she has a small noninflamed external hemorrhoid.  -Avoid straining -Metamucil as tolerated which is her preferred fiber supplement -Apply a small amount of Desitin inside the anal opening into the external anal area 3 times daily as needed -Patient did not wish to have laboratory studies collected today, however, if her symptoms worsen a CBC would be advised -If her rectal bleeding persists or worsens a diagnostic colonoscopy may be warranted prior to her previous colonoscopy recall date  2.  IBS symptoms.  Urgent bowel movements when she eats lunch away from home.  No specific food triggers. -Patient declined celiac serology at this time -Metamucil as noted above -Take one half Imodium tab prior to eating away from home -She will call our office if her symptoms worsen  3.  History of colon polyp.  Degree relative (mother) with colon cancer. -Recall colonoscopy due June 2027, patient prefers a repeat colonoscopy June 2023.  I  discussed her polyps were tubular adenomatous and were small when removed at the time of her colonoscopy June 2020 and a 5 to 7-year recall was appropriate.  I advised the patient to discuss her colonoscopy recall date further with Dr. Adela Lank.

## 2020-09-18 NOTE — Telephone Encounter (Signed)
Patient is returning your call.  

## 2020-09-18 NOTE — Patient Instructions (Signed)
If you are age 67 or older, your body mass index should be between 23-30. Your Body mass index is 21.03 kg/m. If this is out of the aforementioned range listed, please consider follow up with your Primary Care Provider.  If you are age 68 or younger, your body mass index should be between 19-25. Your Body mass index is 21.03 kg/m. If this is out of the aformentioned range listed, please consider follow up with your Primary Care Provider.    Please purchase Metamucil over the counter. Take as directed.  Please apply a small amount of Desitin inside the anal opening and the external hemorrhoid area three times a day as needed.   Take 1/2 Tablet of Imodium before eating out.  Please call our office if your symptoms worsen.   It was great seeing you today!  Thank you for entrusting me with your care and choosing [facility].  Alcide Evener, NP

## 2020-09-20 NOTE — Telephone Encounter (Signed)
Patient was seen in the office on 09/18/20. See office note for more information.

## 2020-09-26 ENCOUNTER — Ambulatory Visit (INDEPENDENT_AMBULATORY_CARE_PROVIDER_SITE_OTHER): Payer: Medicare Other | Admitting: Psychiatry

## 2020-09-26 ENCOUNTER — Encounter: Payer: Self-pay | Admitting: Psychiatry

## 2020-09-26 ENCOUNTER — Other Ambulatory Visit: Payer: Self-pay

## 2020-09-26 DIAGNOSIS — E871 Hypo-osmolality and hyponatremia: Secondary | ICD-10-CM

## 2020-09-26 DIAGNOSIS — F331 Major depressive disorder, recurrent, moderate: Secondary | ICD-10-CM

## 2020-09-26 DIAGNOSIS — F411 Generalized anxiety disorder: Secondary | ICD-10-CM

## 2020-09-26 DIAGNOSIS — F5105 Insomnia due to other mental disorder: Secondary | ICD-10-CM

## 2020-09-26 MED ORDER — BUPROPION HCL ER (XL) 150 MG PO TB24: ORAL_TABLET | ORAL | 0 refills | Status: DC

## 2020-09-26 NOTE — Patient Instructions (Addendum)
Reduce caffeine to help reduce anxiety.  wellbutrin XL 150 mg 1 for 1 week in the AM, then 2 each morning. Discussed the refill will be for 300 mg daily.  Get blood test for sodium after 7 days

## 2020-09-26 NOTE — Progress Notes (Signed)
LIS PENNYWELL [id number] [birth year] 65 y.o.    Subjective:   Patient ID:  Robin Barton is a 65 y.o. (DOB 1956/04/06) female.  Chief Complaint:  Chief Complaint  Patient presents with  . Anxiety  . Follow-up  . Medication Problem  . poor motivation    Depression        Associated symptoms include myalgias.  Associated symptoms include no decreased concentration, no fatigue and no suicidal ideas.  Robin Barton presents to the office today for follow-up mood and anxiety.  seen October 2020.  No meds were changed.  She was doing well psychiatrically.  It was recommended she get a lithium level and BMP and Continue paroxetine 20 mg daily, continue trazodone 50 mg nightly, continue lithium 150 mg alternating with 300 mg every other day.  January 17, 2020 appointment the following is noted: .Ok.  Exposed to virus twice and had to QT.  Had to hlep sister with med probvlems.   Forgot the labs.   Gotten vaccinated. Not taking Xanax.   Sleeps Index with trazodone. Forgot to get labs after reducing the lithium.  Tremor is much better and manageable with the reduction of lithium.  No worsening of mood with the change. Tremor has gotten better on it's own.  No longer needs B6. Plan: Continue paroxetine 20 mg daily, continue trazodone 50 mg nightly, continue lithium 150 mg alternating with 300 mg every other day. Asks for alprazolam prn anxiety.  August 2021 it was discovered she had low sodium and was just recommended she discuss with PCP.  She stopped trazodone and her sodium level improved.  06/26/20 appt noted: Reduced paroxetine to 10 mg from 20 mg daily and stopped trazodone. Low sodium SE caused scary neuro sx.  Had gotten confused and was having tearful episodes.  Sx had developed gradually over time.   Balance issues.   I feel so much better.    Taking salt tablet twice daily. BP is fine.   Emotionally feels good.  On a cleaning rampage at this time.    Lost  a good friend to ALS.   Taking Xanax instead of trazodone for sleep. Plan: No med changes today. Continue lower paroxetine 10 mg daily,  continue lithium 150 mg alternating with 300 mg every other day.  Continue salt tablets Stopped trazodone DT hyponatremia. Asks for alprazolam prn anxiety or insomnia since off the Xanax..  09/26/2020 appointment with following noted: Xanax 0.5 mg HS. Still on salt, paroxetine 10, lithium 300/150 QOD. Asks to take Xanax during the day for anxiety.  Don't seem to have the drive she used to have but once done she feels great.  Some avoidance like social things.  Energy is good. Possibly more anxious at lower dose paroxetine. No crying spells.  H notices reduction in motivation and she doesn't seem like herself. Smokes and needs to stop.  Some occ dep but not usually.  Good enjoyment but less motivation.  Some anxiety over doing things..    Still feels anxious in AM same pattern..  Drinks coffee.  Never had ADD but anxious about doing things she wants to do but doesn't get done..  Patient denies difficulty with sleep initiation or maintenance with trazodone. Denies appetite disturbance.  Patient reports that energy and motivation have been good.  Patient denies any difficulty with concentration.  Patient denies any suicidal ideation.  Past Psychiatric Medication Trials: paroxetine, lithium Xanax, trazodone  Review of Systems:  Review of Systems  Constitutional:  Negative for fatigue.  Musculoskeletal: Positive for myalgias.  Neurological: Positive for tremors. Negative for dizziness and weakness.  Psychiatric/Behavioral: Positive for depression. Negative for agitation, behavioral problems, confusion, decreased concentration, dysphoric mood, hallucinations, self-injury, sleep disturbance and suicidal ideas. The patient is not nervous/anxious and is not hyperactive.     Medications: I have reviewed the patient's current medications.  Current Outpatient  Medications  Medication Sig Dispense Refill  . ALPRAZolam (XANAX) 0.5 MG tablet Take 1 tablet (0.5 mg total) by mouth 2 (two) times daily as needed. 45 tablet 5  . aspirin EC 81 MG tablet Take 81 mg by mouth daily. Swallow whole.    Marland Kitchen buPROPion (WELLBUTRIN XL) 150 MG 24 hr tablet 1 each AM for 1 week, then 2 each AM 30 tablet 0  . carvedilol (COREG) 3.125 MG tablet Take 1 tablet (3.125 mg total) by mouth 2 (two) times daily. 180 tablet 3  . Cholecalciferol (VITAMIN D3) 1.25 MG (50000 UT) CAPS every 14 (fourteen) days.     . cyclobenzaprine (FLEXERIL) 10 MG tablet TK 1 T PO Q 8 H PRN FOR MUSCLE SPASMS    . lisinopril (PRINIVIL,ZESTRIL) 40 MG tablet Take 1 tablet (40 mg total) by mouth daily. 30 tablet 2  . lithium carbonate 150 MG capsule Alternate 2 capsules with 1 capsule every other night 135 capsule 1  . PARoxetine (PAXIL) 10 MG tablet Take 1 tablet (10 mg total) by mouth daily. 90 tablet 1  . Pyridoxine HCl (VITAMIN B-6) 500 MG tablet Take 500 mg by mouth as needed.     . rosuvastatin (CRESTOR) 20 MG tablet Take 1 tablet (20 mg total) by mouth daily. 90 tablet 3  . SODIUM CHLORIDE PO Take by mouth.     No current facility-administered medications for this visit.    Medication Side Effects: Other: tremor recently better  Allergies:  Allergies  Allergen Reactions  . Sulfonamide Derivatives Hives, Itching and Rash    Past Medical History:  Diagnosis Date  . Acid reflux   . Anxiety   . Atypical lobular hyperplasia of left breast 11/25/13   lumpectomy, 12/10/13  . BCC (basal cell carcinoma)    per pt, never had cancer  . Depression   . Hyperlipidemia   . Hypertension   . Osteopenia   . Thyroid disease 2010   rt thyroid nodule    Family History  Problem Relation Age of Onset  . Cancer Mother        colon-stage 4  . Hypertension Mother   . Colon cancer Mother   . Other Father   . Breast cancer Sister   . Colon polyps Neg Hx   . Esophageal cancer Neg Hx   . Rectal cancer  Neg Hx   . Stomach cancer Neg Hx     Social History   Socioeconomic History  . Marital status: Married    Spouse name: Not on file  . Number of children: 2  . Years of education: Not on file  . Highest education level: Not on file  Occupational History  . Not on file  Tobacco Use  . Smoking status: Current Some Day Smoker    Packs/day: 0.50    Years: 35.00    Pack years: 17.50    Types: Cigarettes  . Smokeless tobacco: Never Used  Vaping Use  . Vaping Use: Former  Substance and Sexual Activity  . Alcohol use: Yes    Alcohol/week: 12.0 - 14.0 standard drinks    Types:  12 - 14 Glasses of wine per week  . Drug use: No  . Sexual activity: Yes    Partners: Male    Birth control/protection: Post-menopausal  Other Topics Concern  . Not on file  Social History Narrative  . Not on file   Social Determinants of Health   Financial Resource Strain: Not on file  Food Insecurity: Not on file  Transportation Needs: Not on file  Physical Activity: Not on file  Stress: Not on file  Social Connections: Not on file  Intimate Partner Violence: Not on file    Past Medical History, Surgical history, Social history, and Family history were reviewed and updated as appropriate.   Please see review of systems for further details on the patient's review from today.   Objective:   Physical Exam:  LMP 09/24/1999   Physical Exam Constitutional:      General: She is not in acute distress.    Appearance: She is well-developed.  Musculoskeletal:        General: No deformity.  Neurological:     Mental Status: She is alert and oriented to person, place, and time.     Cranial Nerves: No dysarthria.     Coordination: Coordination normal.  Psychiatric:        Attention and Perception: Attention and perception normal. She does not perceive auditory or visual hallucinations.        Mood and Affect: Mood is anxious. Mood is not depressed. Affect is not labile, blunt, angry or  inappropriate.        Speech: Speech normal. Speech is not rapid and pressured or slurred.        Behavior: Behavior normal. Behavior is cooperative.        Thought Content: Thought content normal. Thought content is not paranoid or delusional. Thought content does not include homicidal or suicidal ideation. Thought content does not include homicidal or suicidal plan.        Cognition and Memory: Cognition and memory normal.        Judgment: Judgment normal.     Comments: Insight good No evidence for cognitive issues.     Lab Review:     Component Value Date/Time   NA 133 (L) 05/26/2020 1631   NA 139 07/19/2014 0903   K 4.4 05/26/2020 1631   K 4.7 07/19/2014 0903   CL 99 05/26/2020 1631   CO2 23 05/26/2020 1631   CO2 24 07/19/2014 0903   GLUCOSE 85 05/26/2020 1631   GLUCOSE 95 07/19/2014 0903   BUN 14 05/26/2020 1631   BUN 22.8 07/19/2014 0903   CREATININE 0.73 05/26/2020 1631   CREATININE 0.8 07/19/2014 0903   CALCIUM 10.3 05/26/2020 1631   CALCIUM 9.6 07/19/2014 0903   PROT 7.3 07/19/2014 0903   ALBUMIN 4.1 07/19/2014 0903   AST 23 07/19/2014 0903   ALT 21 07/19/2014 0903   ALKPHOS 54 07/19/2014 0903   BILITOT 0.49 07/19/2014 0903   GFRNONAA 53 (L) 06/18/2018 0934   GFRAA 61 06/18/2018 0934       Component Value Date/Time   WBC 5.0 07/19/2014 0903   WBC 5.3 04/05/2011 0923   RBC 3.94 07/19/2014 0903   RBC 4.05 04/05/2011 0923   HGB 12.5 07/19/2014 0903   HCT 37.8 07/19/2014 0903   PLT 370 07/19/2014 0903   MCV 96.0 07/19/2014 0903   MCH 31.8 07/19/2014 0903   MCH 31.9 04/05/2011 0923   MCHC 33.1 07/19/2014 0903   MCHC 33.2 04/05/2011 0350  RDW 12.8 07/19/2014 0903   LYMPHSABS 1.5 07/19/2014 0903   MONOABS 0.6 07/19/2014 0903   EOSABS 0.1 07/19/2014 0903   BASOSABS 0.0 07/19/2014 0903    Lithium Lvl  Date Value Ref Range Status  04/27/2020 0.5 (L) 0.6 - 1.2 mmol/L Final     No results found for: PHENYTOIN, PHENOBARB, VALPROATE, CBMZ    .res Assessment: Plan:    Generalized anxiety disorder  Major depressive disorder, recurrent episode, moderate ([place]) - Plan: buPROPion (WELLBUTRIN XL) 150 MG 24 hr tablet  Insomnia due to mental condition  Hyponatremia  Please see After Visit Summary for patient specific instructions.  Overall she remained stable after the reduction in both lithium and paroxetine to current dosages but is less motivated and H notices.  Unsure if depression anxierty or something else.  We discussed her history of response to lithium at the lower dose 150 and then the 300 mg a day.  We discussed her side effects of tremor.  It's better now.   The tremor is better with the reduction to the current dose of lithium and the use of B6.  It is now manageable.  Disc option stopping B6 to see if she still needs it. Last lithium level is low normal.  We discussed the question of her diagnosis given at prior history of 2 weeks of hypomania but that may have been related to medications.  The uncertainty whether she truly has major depression versus bipolar disorder remains.  We discussed the risk of antidepressants causing mood cycling and the advantage to being on a lower dose.  Repeat Sodium in 1 week  Recent hyponatremia in August as low as 128 improved to 133 after stopping trazodone and adding salt tablets. Repeated twice.  Continue lower paroxetine 10 mg daily,  continue lithium 150 mg alternating with 300 mg every other day.  Continue salt tablets Stopped trazodone DT hyponatremia.  Asks for alprazolam prn anxiety or insomnia since off the Xanax..  Reduce caffeine bc she gets anxious after taking it and has not reduced as requested. wellbutrin XL 150 mg 1 for 1 week in the AM, then 2 each morning. Discussed the refill will be for 300 mg daily.  Follow-up sooner 6-8 weeks  Lynder Parents MD, DFAPA\  Future Appointments  Date Time Provider [place]  11/01/2020 10:40 AM Freada Bergeron,  MD [id number]    No orders of the defined types were placed in this encounter.     -------------------------------

## 2020-10-14 LAB — BASIC METABOLIC PANEL
BUN: 20 mg/dL (ref 7–25)
CO2: 23 mmol/L (ref 20–32)
Calcium: 9.6 mg/dL (ref 8.6–10.4)
Chloride: 102 mmol/L (ref 98–110)
Creat: 0.79 mg/dL (ref 0.50–0.99)
Glucose, Bld: 131 mg/dL — ABNORMAL HIGH (ref 65–99)
Potassium: 4.5 mmol/L (ref 3.5–5.3)
Sodium: 136 mmol/L (ref 135–146)

## 2020-10-29 NOTE — Progress Notes (Signed)
Cardiology Office Note:    Date:  11/01/2020   ID:  Robin Barton, DOB 22-Dec-1955, MRN [id number]  PCP:  Velna Hatchet, MD  [facility] Cardiologist:  No primary care provider on file.  [facility] Electrophysiologist:  None   Referring MD: Velna Hatchet, MD    History of Present Illness:    Robin Barton is a 65 y.o. female with a hx of GERD, anxiety, depression, HLD, HTN and elevated coronary calcium score who returns to clinic for follow-up.  Patient was seen in clinic last on 07/27/20 where she was doing well. No exertional symptoms. Calcium score 702. TTE was obtained which showed normal BiV function, no significant valvular abnormalities. She was continued on ASA, her crestor was increased to 20mg  daily, and her atenolol was changed to coreg. She now returns to clinic for follow-up.  The patients that she feels well. No chest pain or shortness of breath. She is cutting back on smoking significantly with the goal to quit. Was placed on Wellbutrin by her psychiatrist to help with depression and smoking cessation. She has cut back to at most 5 per day. Interested in nicotine patch as well.   Past Medical History:  Diagnosis Date  . Acid reflux   . Anxiety   . Atypical lobular hyperplasia of left breast 11/25/13   lumpectomy, 12/10/13  . BCC (basal cell carcinoma)    per pt, never had cancer  . Depression   . Hyperlipidemia   . Hypertension   . Osteopenia   . Thyroid disease 2010   rt thyroid nodule    Past Surgical History:  Procedure Laterality Date  . BREAST LUMPECTOMY WITH RADIOACTIVE SEED LOCALIZATION Left 12/10/2013   Procedure: BREAST LUMPECTOMY WITH RADIOACTIVE SEED LOCALIZATION;  Surgeon: [name], MD;  Location: [facility];  Service: General;  Laterality: Left;  . BUNIONECTOMY  2/13 left foot and toe straightened  . BUNIONECTOMY  3/13 right foot and toe straightened  . COLONOSCOPY    . NASAL SEPTUM SURGERY  1982     Current Medications: Current Meds  Medication Sig  . ALPRAZolam (XANAX) 0.5 MG tablet Take 1 tablet (0.5 mg total) by mouth 2 (two) times daily as needed.  Marland Kitchen aspirin EC 81 MG tablet Take 81 mg by mouth daily. Swallow whole.  Marland Kitchen buPROPion (WELLBUTRIN XL) 150 MG 24 hr tablet 1 each AM for 1 week, then 2 each AM  . carvedilol (COREG) 3.125 MG tablet Take 1 tablet (3.125 mg total) by mouth 2 (two) times daily.  . Cholecalciferol (VITAMIN D3) 1.25 MG (50000 UT) CAPS every 14 (fourteen) days.   . cyclobenzaprine (FLEXERIL) 10 MG tablet TK 1 T PO Q 8 H PRN FOR MUSCLE SPASMS  . lisinopril (PRINIVIL,ZESTRIL) 40 MG tablet Take 1 tablet (40 mg total) by mouth daily.  Marland Kitchen lithium carbonate 150 MG capsule Alternate 2 capsules with 1 capsule every other night  . nicotine (NICODERM CQ) 14 mg/24hr patch Place 1 patch (14 mg total) onto the skin daily.  Marland Kitchen PARoxetine (PAXIL) 10 MG tablet Take 1 tablet (10 mg total) by mouth daily.  . Pyridoxine HCl (VITAMIN B-6) 500 MG tablet Take 500 mg by mouth as needed.   . rosuvastatin (CRESTOR) 20 MG tablet Take 1 tablet (20 mg total) by mouth daily.  . SODIUM CHLORIDE PO Take by mouth.     Allergies:   Sulfonamide derivatives   Social History   Socioeconomic History  . Marital status: Married  Spouse name: Not on file  . Number of children: 2  . Years of education: Not on file  . Highest education level: Not on file  Occupational History  . Not on file  Tobacco Use  . Smoking status: Current Some Day Smoker    Packs/day: 0.50    Years: 35.00    Pack years: 17.50    Types: Cigarettes  . Smokeless tobacco: Never Used  Vaping Use  . Vaping Use: Former  Substance and Sexual Activity  . Alcohol use: Yes    Alcohol/week: 12.0 - 14.0 standard drinks    Types: 12 - 14 Glasses of wine per week  . Drug use: No  . Sexual activity: Yes    Partners: Male    Birth control/protection: Post-menopausal  Other Topics Concern  . Not on file  Social History  Narrative  . Not on file   Social Determinants of Health   Financial Resource Strain: Not on file  Food Insecurity: Not on file  Transportation Needs: Not on file  Physical Activity: Not on file  Stress: Not on file  Social Connections: Not on file     Family History: The patient's family history includes Breast cancer in her sister; Cancer in her mother; Colon cancer in her mother; Hypertension in her mother; Other in her father. There is no history of Colon polyps, Esophageal cancer, Rectal cancer, or Stomach cancer.  ROS:   Please see the history of present illness.    Review of Systems  Constitutional: Negative for chills and fever.  HENT: Negative for nosebleeds.   Eyes: Negative for blurred vision and redness.  Respiratory: Negative for shortness of breath.   Cardiovascular: Negative for chest pain, palpitations, orthopnea, claudication, leg swelling and PND.  Gastrointestinal: Negative for nausea and vomiting.  Genitourinary: Negative for frequency.  Musculoskeletal: Negative for falls.  Neurological: Negative for dizziness and loss of consciousness.  Endo/Heme/Allergies: Negative for polydipsia.  Psychiatric/Behavioral: Negative for substance abuse.    EKGs/Labs/Other Studies Reviewed:    The following studies were reviewed today:  TTE 08/24/20: IMPRESSIONS  1. Left ventricular ejection fraction, by estimation, is 60 to 65%. The  left ventricle has normal function. The left ventricle has no regional  wall motion abnormalities. Left ventricular diastolic parameters were  normal.  2. Right ventricular systolic function is normal. The right ventricular  size is normal. There is normal pulmonary artery systolic pressure.  3. The mitral valve is normal in structure. No evidence of mitral valve  regurgitation. No evidence of mitral stenosis.  4. The aortic valve is normal in structure. Aortic valve regurgitation is  not visualized. No aortic stenosis is present.   5. The inferior vena cava is normal in size with greater than 50%  respiratory variability, suggesting right atrial pressure of 3 mmHg.  CT 07/05/20: CORONARY CALCIUM SCORES:  Left Main: 0  LAD: 274  LCx: 177  RCA: 250  Total Agatston Score: 702  MESA database percentile: 97  AORTA MEASUREMENTS:  Ascending Aorta: 31 mm  Descending Aorta: 23 mm  OTHER FINDINGS:  Heart is normal size. No adenopathy. Visualized lungs clear. No effusions. Imaging into the upper abdomen demonstrates no acute findings. Chest wall soft tissues are unremarkable. No acute bony abnormality.  IMPRESSION: The observed calcium score of 702 is at the percentile 97 for subjects of the same age, gender and race/ethnicity who are free of clinical cardiovascular disease and treated diabetes.  No acute or significant extracardiac abnormality  Recent Labs:  04/27/2020: TSH 1.03 10/13/2020: BUN 20; Creat 0.79; Potassium 4.5; Sodium 136  Recent Lipid Panel    Component Value Date/Time   CHOL 186 07/19/2014 0903   TRIG 40 07/19/2014 0903   HDL 98 07/19/2014 0903   CHOLHDL 1.9 07/19/2014 0903   VLDL 8 07/19/2014 0903   LDLCALC 80 07/19/2014 0903     Physical Exam:    VS:  BP 108/60   Pulse (!) 59   Ht 5\' 1"  (1.549 m)   Wt 112 lb (50.8 kg)   LMP 09/24/1999   SpO2 97%   BMI 21.16 kg/m     Wt Readings from Last 3 Encounters:  11/01/20 112 lb (50.8 kg)  09/18/20 110 lb 6.4 oz (50.1 kg)  07/27/20 109 lb (49.4 kg)     GEN:  Well nourished, well developed in no acute distress HEENT: Normal NECK: No JVD; No carotid bruits CARDIAC: RRR, no murmurs, rubs, gallops RESPIRATORY:  Clear to auscultation without rales, wheezing or rhonchi  ABDOMEN: Soft, non-tender, non-distended MUSCULOSKELETAL:  No edema; No deformity  SKIN: Warm and dry NEUROLOGIC:  Alert and oriented x 3 PSYCHIATRIC:  Normal affect   ASSESSMENT:    1. Coronary artery disease involving native coronary artery  of native heart without angina pectoris   2. Primary hypertension   3. Tobacco abuse   4. Pure hypercholesterolemia    PLAN:    In order of problems listed above:  #Multivessel CAD: #Coronary calcification: Patient with coronary calcium score 702 (97% for age, gender, race matched controls). Calcium noted in all three coronary arteries consistent with multivessel CAD. No symptoms and patient is very active without limitations. ECG without evidence of ischemia or infarct. Risk factors include tobacco use, HTN, HLD and age. -TTE with normal BiV function, no WMA -Continue ASA 81mg  daily -Continue crestor 20mg  daily -Continue coreg 3.125mg  BID -Continue lisinopril 40mg  daily -Patient is doing well with trying to quit smoking; will start nicotine patches as below  #HTN: Managed by PCP. -Continue coreg 3.125mg  BID -Continue lisinopril 40mg  daily -Monitor at home with goal BP<120s/80s  #HLD: -Merits high dose statin give degree of CAD on CT scan; increased crestor to 20mg  daily -Follow-up with PCP for continued monitoring  #Tobacco use: Patient is currently working on quitting and has significantly cut back.  -Continue wellbutrin -Start nicotine patches   Medication Adjustments/Labs and Tests Ordered: Current medicines are reviewed at length with the patient today.  Concerns regarding medicines are outlined above.  No orders of the defined types were placed in this encounter.  Meds ordered this encounter  Medications  . nicotine (NICODERM CQ) 14 mg/24hr patch    Sig: Place 1 patch (14 mg total) onto the skin daily.    Dispense:  30 patch    Refill:  3    Patient Instructions  Medication Instructions:  Your physician has recommended you make the following change in your medication:   START: 1. Nicotine Patches 14gm, one patch per day.   *If you need a refill on your cardiac medications before your next appointment, please call your pharmacy*   Lab Work: none If  you have labs (blood work) drawn today and your tests are completely normal, you will receive your results only by: Marland Kitchen MyChart Message (if you have MyChart) OR . A paper copy in the mail If you have any lab test that is abnormal or we need to change your treatment, we will call you to review the results.   Testing/Procedures:  none   Follow-Up: At [facility], you and your health needs are our priority.  As part of our continuing mission to provide you with exceptional heart care, we have created designated Provider Care Teams.  These Care Teams include your primary Cardiologist (physician) and Advanced Practice Providers (APPs -  Physician Assistants and Nurse Practitioners) who all work together to provide you with the care you need, when you need it.  We recommend signing up for the patient portal called "MyChart".  Sign up information is provided on this After Visit Summary.  MyChart is used to connect with patients for Virtual Visits (Telemedicine).  Patients are able to view lab/test results, encounter notes, upcoming appointments, etc.  Non-urgent messages can be sent to your provider as well.   To learn more about what you can do with MyChart, go to [email or web address].    Your next appointment:   6 month(s)  The format for your next appointment:   In Person  Provider:   You may see Dr. Gwyndolyn Kaufman, MD or one of the following Advanced Practice Providers on your designated Care Team:    Richardson Dopp, PA-C  Robbie Lis, [place]       Signed, Freada Bergeron, MD  11/01/2020 11:45 AM    [place]

## 2020-11-01 ENCOUNTER — Encounter: Payer: Self-pay | Admitting: Cardiology

## 2020-11-01 ENCOUNTER — Ambulatory Visit: Payer: Medicare Other | Admitting: Cardiology

## 2020-11-01 ENCOUNTER — Other Ambulatory Visit: Payer: Self-pay

## 2020-11-01 VITALS — BP 108/60 | HR 59 | Ht 61.0 in | Wt 112.0 lb

## 2020-11-01 DIAGNOSIS — E7800 Pure hypercholesterolemia, unspecified: Secondary | ICD-10-CM | POA: Diagnosis not present

## 2020-11-01 DIAGNOSIS — I10 Essential (primary) hypertension: Secondary | ICD-10-CM

## 2020-11-01 DIAGNOSIS — I2510 Atherosclerotic heart disease of native coronary artery without angina pectoris: Secondary | ICD-10-CM

## 2020-11-01 DIAGNOSIS — Z720 Tobacco use: Secondary | ICD-10-CM

## 2020-11-01 MED ORDER — NICOTINE 14 MG/24HR TD PT24: 14.0000 mg | MEDICATED_PATCH | Freq: Every day | TRANSDERMAL | 3 refills | Status: AC

## 2020-11-01 NOTE — Patient Instructions (Signed)
Medication Instructions:  Your physician has recommended you make the following change in your medication:   START: 1. Nicotine Patches 14gm, one patch per day.   *If you need a refill on your cardiac medications before your next appointment, please call your pharmacy*   Lab Work: none If you have labs (blood work) drawn today and your tests are completely normal, you will receive your results only by: Marland Kitchen MyChart Message (if you have MyChart) OR . A paper copy in the mail If you have any lab test that is abnormal or we need to change your treatment, we will call you to review the results.   Testing/Procedures: none   Follow-Up: At [facility], you and your health needs are our priority.  As part of our continuing mission to provide you with exceptional heart care, we have created designated Provider Care Teams.  These Care Teams include your primary Cardiologist (physician) and Advanced Practice Providers (APPs -  Physician Assistants and Nurse Practitioners) who all work together to provide you with the care you need, when you need it.  We recommend signing up for the patient portal called "MyChart".  Sign up information is provided on this After Visit Summary.  MyChart is used to connect with patients for Virtual Visits (Telemedicine).  Patients are able to view lab/test results, encounter notes, upcoming appointments, etc.  Non-urgent messages can be sent to your provider as well.   To learn more about what you can do with MyChart, go to [email or web address].    Your next appointment:   6 month(s)  The format for your next appointment:   In Person  Provider:   You may see Dr. Gwyndolyn Kaufman, MD or one of the following Advanced Practice Providers on your designated Care Team:    Richardson Dopp, PA-C  [place], [place]

## 2020-11-10 ENCOUNTER — Other Ambulatory Visit: Payer: Self-pay | Admitting: Psychiatry

## 2020-11-10 DIAGNOSIS — F331 Major depressive disorder, recurrent, moderate: Secondary | ICD-10-CM

## 2020-11-13 ENCOUNTER — Encounter: Payer: Self-pay | Admitting: Psychiatry

## 2020-11-13 ENCOUNTER — Other Ambulatory Visit: Payer: Self-pay

## 2020-11-13 ENCOUNTER — Ambulatory Visit (INDEPENDENT_AMBULATORY_CARE_PROVIDER_SITE_OTHER): Payer: Medicare Other | Admitting: Psychiatry

## 2020-11-13 DIAGNOSIS — F17210 Nicotine dependence, cigarettes, uncomplicated: Secondary | ICD-10-CM

## 2020-11-13 DIAGNOSIS — F331 Major depressive disorder, recurrent, moderate: Secondary | ICD-10-CM

## 2020-11-13 DIAGNOSIS — F39 Unspecified mood [affective] disorder: Secondary | ICD-10-CM

## 2020-11-13 DIAGNOSIS — F411 Generalized anxiety disorder: Secondary | ICD-10-CM | POA: Diagnosis not present

## 2020-11-13 DIAGNOSIS — F5105 Insomnia due to other mental disorder: Secondary | ICD-10-CM | POA: Diagnosis not present

## 2020-11-13 DIAGNOSIS — E871 Hypo-osmolality and hyponatremia: Secondary | ICD-10-CM

## 2020-11-13 MED ORDER — LITHIUM CARBONATE 150 MG PO CAPS: ORAL_CAPSULE | ORAL | 1 refills | Status: DC

## 2020-11-13 MED ORDER — BUPROPION HCL ER (SR) 100 MG PO TB12: 100.0000 mg | ORAL_TABLET | Freq: Every day | ORAL | 1 refills | Status: DC

## 2020-11-13 MED ORDER — PAROXETINE HCL 10 MG PO TABS: 10.0000 mg | ORAL_TABLET | Freq: Every day | ORAL | 1 refills | Status: DC

## 2020-11-13 MED ORDER — ALPRAZOLAM 0.5 MG PO TABS: 0.5000 mg | ORAL_TABLET | Freq: Two times a day (BID) | ORAL | 5 refills | Status: DC | PRN

## 2020-11-13 NOTE — Progress Notes (Signed)
Robin Barton [id number] [birth year] 65 y.o.    Subjective:   Patient ID:  Robin Barton is a 65 y.o. (DOB 1955-10-19) female.  Chief Complaint:  Chief Complaint  Patient presents with  . Follow-up  . Generalized anxiety disorder  . Depression  . Other    smoking    Depression        Associated symptoms include myalgias.  Associated symptoms include no decreased concentration, no fatigue and no suicidal ideas.  Robin Barton presents to the office today for follow-up mood and anxiety.  seen October 2020.  No meds were changed.  She was doing well psychiatrically.  It was recommended she get a lithium level and BMP and Continue paroxetine 20 mg daily, continue trazodone 50 mg nightly, continue lithium 150 mg alternating with 300 mg every other day.  January 17, 2020 appointment the following is noted: .Ok.  Exposed to virus twice and had to QT.  Had to hlep sister with med probvlems.   Forgot the labs.   Gotten vaccinated. Not taking Xanax.   Sleeps [place] with trazodone. Forgot to get labs after reducing the lithium.  Tremor is much better and manageable with the reduction of lithium.  No worsening of mood with the change. Tremor has gotten better on it's own.  No longer needs B6. Plan: Continue paroxetine 20 mg daily, continue trazodone 50 mg nightly, continue lithium 150 mg alternating with 300 mg every other day. Asks for alprazolam prn anxiety.  August 2021 it was discovered she had low sodium and was just recommended she discuss with PCP.  She stopped trazodone and her sodium level improved.  06/26/20 appt noted: Reduced paroxetine to 10 mg from 20 mg daily and stopped trazodone. Low sodium SE caused scary neuro sx.  Had gotten confused and was having tearful episodes.  Sx had developed gradually over time.   Balance issues.   I feel so much better.    Taking salt tablet twice daily. BP is fine.   Emotionally feels good.  On a cleaning rampage at this  time.    Lost a good friend to ALS.   Taking Xanax instead of trazodone for sleep. Plan: No med changes today. Continue lower paroxetine 10 mg daily,  continue lithium 150 mg alternating with 300 mg every other day.  Continue salt tablets Stopped trazodone DT hyponatremia. Asks for alprazolam prn anxiety or insomnia since off the Xanax..  09/26/2020 appointment with following noted: Xanax 0.5 mg HS. Still on salt, paroxetine 10, lithium 300/150 QOD. Asks to take Xanax during the day for anxiety.  Don't seem to have the drive she used to have but once done she feels great.  Some avoidance like social things.  Energy is good. Possibly more anxious at lower dose paroxetine. No crying spells.  H notices reduction in motivation and she doesn't seem like herself. Smokes and needs to stop. Plan: Reduce caffeine bc she gets anxious after taking it and has not reduced as requested. wellbutrin XL 150 mg 1 for 1 week in the AM, then 2 each morning. Repeat serum sodium  11/13/2020 appointment with following noted: Wellbutrin helped me settle down and reduced craving to smoke.  Never got above 150 mg XL.   SE night sweats. Still continues paroxetine 10. Not sleeping as well either with awakening with sweats.  Cut smoking way back to as little as 3-5 per day with less craving from 1/2 to 1 PPD. Emotionally felt better with better task  completion.  Better motivation.  Don't feel as anxious in AM and less coffee in the AM.  Some occ dep but not usually.  Good enjoyment but less motivation.  Some anxiety over doing things..    Still feels anxious in AM same pattern..  Drinks coffee.  Never had ADD but anxious about doing things she wants to do but doesn't get done..  Patient denies difficulty with sleep initiation or maintenance with trazodone. Denies appetite disturbance.  Patient reports that energy and motivation have been good.  Patient denies any difficulty with concentration.  Patient denies any suicidal  ideation.  Past Psychiatric Medication Trials: paroxetine, lithium Xanax, trazodone  Review of Systems:  Review of Systems  Constitutional: Negative for fatigue.  Musculoskeletal: Positive for myalgias.  Neurological: Positive for tremors. Negative for dizziness and weakness.  Psychiatric/Behavioral: Positive for depression. Negative for agitation, behavioral problems, confusion, decreased concentration, dysphoric mood, hallucinations, self-injury, sleep disturbance and suicidal ideas. The patient is not nervous/anxious and is not hyperactive.     Medications: I have reviewed the patient's current medications.  Current Outpatient Medications  Medication Sig Dispense Refill  . aspirin EC 81 MG tablet Take 81 mg by mouth daily. Swallow whole.    Marland Kitchen buPROPion (WELLBUTRIN SR) 100 MG 12 hr tablet Take 1 tablet (100 mg total) by mouth daily. 30 tablet 1  . carvedilol (COREG) 3.125 MG tablet Take 1 tablet (3.125 mg total) by mouth 2 (two) times daily. 180 tablet 3  . Cholecalciferol (VITAMIN D3) 1.25 MG (50000 UT) CAPS every 14 (fourteen) days.     . cyclobenzaprine (FLEXERIL) 10 MG tablet TK 1 T PO Q 8 H PRN FOR MUSCLE SPASMS    . lisinopril (PRINIVIL,ZESTRIL) 40 MG tablet Take 1 tablet (40 mg total) by mouth daily. 30 tablet 2  . Pyridoxine HCl (VITAMIN B-6) 500 MG tablet Take 500 mg by mouth as needed.     . rosuvastatin (CRESTOR) 20 MG tablet Take 1 tablet (20 mg total) by mouth daily. 90 tablet 3  . SODIUM CHLORIDE PO Take by mouth in the morning and at bedtime.    . ALPRAZolam (XANAX) 0.5 MG tablet Take 1 tablet (0.5 mg total) by mouth 2 (two) times daily as needed. 45 tablet 5  . lithium carbonate 150 MG capsule Alternate 2 capsules with 1 capsule every other night 135 capsule 1  . nicotine (NICODERM CQ) 14 mg/24hr patch Place 1 patch (14 mg total) onto the skin daily. (Patient not taking: Reported on 11/13/2020) 30 patch 3  . PARoxetine (PAXIL) 10 MG tablet Take 1 tablet (10 mg total) by  mouth daily. 90 tablet 1   No current facility-administered medications for this visit.    Medication Side Effects: Other: tremor recently better  Allergies:  Allergies  Allergen Reactions  . Sulfonamide Derivatives Hives, Itching and Rash    Past Medical History:  Diagnosis Date  . Acid reflux   . Anxiety   . Atypical lobular hyperplasia of left breast 11/25/13   lumpectomy, 12/10/13  . BCC (basal cell carcinoma)    per pt, never had cancer  . Depression   . Hyperlipidemia   . Hypertension   . Osteopenia   . Thyroid disease 2010   rt thyroid nodule    Family History  Problem Relation Age of Onset  . Cancer Mother        colon-stage 4  . Hypertension Mother   . Colon cancer Mother   . Other Father   .  Breast cancer Sister   . Colon polyps Neg Hx   . Esophageal cancer Neg Hx   . Rectal cancer Neg Hx   . Stomach cancer Neg Hx     Social History   Socioeconomic History  . Marital status: Married    Spouse name: Not on file  . Number of children: 2  . Years of education: Not on file  . Highest education level: Not on file  Occupational History  . Not on file  Tobacco Use  . Smoking status: Current Some Day Smoker    Packs/day: 0.50    Years: 35.00    Pack years: 17.50    Types: Cigarettes  . Smokeless tobacco: Never Used  Vaping Use  . Vaping Use: Former  Substance and Sexual Activity  . Alcohol use: Yes    Alcohol/week: 12.0 - 14.0 standard drinks    Types: 12 - 14 Glasses of wine per week  . Drug use: No  . Sexual activity: Yes    Partners: Male    Birth control/protection: Post-menopausal  Other Topics Concern  . Not on file  Social History Narrative  . Not on file   Social Determinants of Health   Financial Resource Strain: Not on file  Food Insecurity: Not on file  Transportation Needs: Not on file  Physical Activity: Not on file  Stress: Not on file  Social Connections: Not on file  Intimate Partner Violence: Not on file    Past  Medical History, Surgical history, Social history, and Family history were reviewed and updated as appropriate.   Please see review of systems for further details on the patient's review from today.   Objective:   Physical Exam:  LMP 09/24/1999   Physical Exam Constitutional:      General: She is not in acute distress.    Appearance: She is well-developed.  Musculoskeletal:        General: No deformity.  Neurological:     Mental Status: She is alert and oriented to person, place, and time.     Cranial Nerves: No dysarthria.     Coordination: Coordination normal.  Psychiatric:        Attention and Perception: Attention and perception normal. She does not perceive auditory or visual hallucinations.        Mood and Affect: Mood is anxious. Mood is not depressed. Affect is not labile, blunt, angry or inappropriate.        Speech: Speech normal. Speech is not rapid and pressured or slurred.        Behavior: Behavior normal. Behavior is cooperative.        Thought Content: Thought content normal. Thought content is not paranoid or delusional. Thought content does not include homicidal or suicidal ideation. Thought content does not include homicidal or suicidal plan.        Cognition and Memory: Cognition and memory normal.        Judgment: Judgment normal.     Comments: Insight good No evidence for cognitive issues.     Lab Review:     Component Value Date/Time   NA 136 10/13/2020 1314   NA 139 07/19/2014 0903   K 4.5 10/13/2020 1314   K 4.7 07/19/2014 0903   CL 102 10/13/2020 1314   CO2 23 10/13/2020 1314   CO2 24 07/19/2014 0903   GLUCOSE 131 (H) 10/13/2020 1314   GLUCOSE 95 07/19/2014 0903   BUN 20 10/13/2020 1314   BUN 22.8 07/19/2014 0903  CREATININE 0.79 10/13/2020 1314   CREATININE 0.8 07/19/2014 0903   CALCIUM 9.6 10/13/2020 1314   CALCIUM 9.6 07/19/2014 0903   PROT 7.3 07/19/2014 0903   ALBUMIN 4.1 07/19/2014 0903   AST 23 07/19/2014 0903   ALT 21 07/19/2014  0903   ALKPHOS 54 07/19/2014 0903   BILITOT 0.49 07/19/2014 0903   GFRNONAA 53 (L) 06/18/2018 0934   GFRAA 61 06/18/2018 0934       Component Value Date/Time   WBC 5.0 07/19/2014 0903   WBC 5.3 04/05/2011 0923   RBC 3.94 07/19/2014 0903   RBC 4.05 04/05/2011 0923   HGB 12.5 07/19/2014 0903   HCT 37.8 07/19/2014 0903   PLT 370 07/19/2014 0903   MCV 96.0 07/19/2014 0903   MCH 31.8 07/19/2014 0903   MCH 31.9 04/05/2011 0923   MCHC 33.1 07/19/2014 0903   MCHC 33.2 04/05/2011 0923   RDW 12.8 07/19/2014 0903   LYMPHSABS 1.5 07/19/2014 0903   MONOABS 0.6 07/19/2014 0903   EOSABS 0.1 07/19/2014 0903   BASOSABS 0.0 07/19/2014 0903    Lithium Lvl  Date Value Ref Range Status  04/27/2020 0.5 (L) 0.6 - 1.2 mmol/L Final     No results found for: PHENYTOIN, PHENOBARB, VALPROATE, CBMZ   .res Assessment: Plan:    Major depressive disorder, recurrent episode, moderate (HCC) - Plan: buPROPion (WELLBUTRIN SR) 100 MG 12 hr tablet  Generalized anxiety disorder - Plan: ALPRAZolam (XANAX) 0.5 MG tablet, PARoxetine (PAXIL) 10 MG tablet  Cigarette smoker motivated to quit - Plan: buPROPion (WELLBUTRIN SR) 100 MG 12 hr tablet  Insomnia due to mental condition - Plan: ALPRAZolam (XANAX) 0.5 MG tablet  Hyponatremia  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  Overall she remained stable after the reduction in both lithium and paroxetine to current dosages.  Better motivation and task completion and less msoking with Wellbutrin But SE.  We discussed her history of response to lithium at the lower dose 150 and then the 300 mg a day.  We discussed her side effects of tremor.  It's better now.   The tremor is better with the reduction to the current dose of lithium and the use of B6.  It is now manageable.  Disc option stopping B6 to see if she still needs  it. Last lithium level is low normal.  We discussed the question of her diagnosis given at prior history of 2 weeks of hypomania but that may have been related to medications.  The uncertainty whether she truly has major depression versus bipolar disorder remains.  We discussed the risk of antidepressants causing mood cycling and the advantage to being on a lower dose.  Repeat Sodium 10/13/20 =136 Recent hyponatremia in August as low as 128 improved to 133 after stopping trazodone and adding salt tablets. Repeated twice. Reduce salt tablets to 1 daily from 2 daily.  Continue lower paroxetine 10 mg daily,  continue lithium 150 mg alternating with 300 mg every other day.   Stopped trazodone DT hyponatremia.  Asks for alprazolam prn anxiety or insomnia since off the trazodone..  Switch to Wellbutrin SR 100 mg AM to reduce SE.  It's helping. Disc smoking cessation..  Follow-up 3 mos  Lynder Parents MD, DFAPA\  No future appointments.  No orders of the defined types were placed in this encounter.     -------------------------------

## 2020-12-07 ENCOUNTER — Other Ambulatory Visit: Payer: Self-pay | Admitting: Psychiatry

## 2020-12-07 DIAGNOSIS — F331 Major depressive disorder, recurrent, moderate: Secondary | ICD-10-CM

## 2020-12-22 ENCOUNTER — Other Ambulatory Visit: Payer: Self-pay | Admitting: Psychiatry

## 2020-12-22 DIAGNOSIS — F331 Major depressive disorder, recurrent, moderate: Secondary | ICD-10-CM

## 2020-12-22 DIAGNOSIS — F17210 Nicotine dependence, cigarettes, uncomplicated: Secondary | ICD-10-CM

## 2020-12-30 ENCOUNTER — Other Ambulatory Visit: Payer: Self-pay | Admitting: Psychiatry

## 2020-12-30 DIAGNOSIS — F411 Generalized anxiety disorder: Secondary | ICD-10-CM

## 2020-12-30 DIAGNOSIS — F39 Unspecified mood [affective] disorder: Secondary | ICD-10-CM

## 2021-01-04 ENCOUNTER — Other Ambulatory Visit: Payer: Self-pay | Admitting: Psychiatry

## 2021-01-04 DIAGNOSIS — F17210 Nicotine dependence, cigarettes, uncomplicated: Secondary | ICD-10-CM

## 2021-01-04 DIAGNOSIS — F331 Major depressive disorder, recurrent, moderate: Secondary | ICD-10-CM

## 2021-01-22 ENCOUNTER — Other Ambulatory Visit: Payer: Self-pay | Admitting: Psychiatry

## 2021-01-22 DIAGNOSIS — F331 Major depressive disorder, recurrent, moderate: Secondary | ICD-10-CM

## 2021-01-22 DIAGNOSIS — F17210 Nicotine dependence, cigarettes, uncomplicated: Secondary | ICD-10-CM

## 2021-02-06 ENCOUNTER — Ambulatory Visit (INDEPENDENT_AMBULATORY_CARE_PROVIDER_SITE_OTHER): Payer: Medicare Other | Admitting: Psychiatry

## 2021-02-06 ENCOUNTER — Other Ambulatory Visit: Payer: Self-pay

## 2021-02-06 ENCOUNTER — Encounter: Payer: Self-pay | Admitting: Psychiatry

## 2021-02-06 DIAGNOSIS — F17210 Nicotine dependence, cigarettes, uncomplicated: Secondary | ICD-10-CM | POA: Diagnosis not present

## 2021-02-06 DIAGNOSIS — F5105 Insomnia due to other mental disorder: Secondary | ICD-10-CM | POA: Diagnosis not present

## 2021-02-06 DIAGNOSIS — F331 Major depressive disorder, recurrent, moderate: Secondary | ICD-10-CM

## 2021-02-06 DIAGNOSIS — R232 Flushing: Secondary | ICD-10-CM

## 2021-02-06 DIAGNOSIS — F411 Generalized anxiety disorder: Secondary | ICD-10-CM | POA: Diagnosis not present

## 2021-02-06 MED ORDER — BUPROPION HCL ER (SR) 100 MG PO TB12: 100.0000 mg | ORAL_TABLET | Freq: Every day | ORAL | 1 refills | Status: DC

## 2021-02-06 MED ORDER — ALPRAZOLAM 0.5 MG PO TABS: 0.5000 mg | ORAL_TABLET | Freq: Two times a day (BID) | ORAL | 1 refills | Status: DC | PRN

## 2021-02-06 NOTE — Progress Notes (Signed)
BRYLI CEASAR KH:[id number] [birth year] 65 y.o.    Subjective:   Patient ID:  Robin Barton is a 65 y.o. (DOB 12/25/1955) female.  Chief Complaint:  Chief Complaint  Patient presents with  . Follow-up  . Major depressive disorder, recurrent episode, moderate ([place])  . Generalized anxiety disorder    Depression        Associated symptoms include myalgias.  Associated symptoms include no decreased concentration, no fatigue and no suicidal ideas.  [name] presents to the office today for follow-up mood and anxiety.  seen October 2020.  No meds were changed.  She was doing well psychiatrically.  It was recommended she get a lithium level and BMP and Continue paroxetine 20 mg daily, continue trazodone 50 mg nightly, continue lithium 150 mg alternating with 300 mg every other day.  January 17, 2020 appointment the following is noted: .Ok.  Exposed to virus twice and had to QT.  Had to hlep sister with med probvlems.   Forgot the labs.   Gotten vaccinated. Not taking Xanax.   Sleeps [place] with trazodone. Forgot to get labs after reducing the lithium.  Tremor is much better and manageable with the reduction of lithium.  No worsening of mood with the change. Tremor has gotten better on it's own.  No longer needs B6. Plan: Continue paroxetine 20 mg daily, continue trazodone 50 mg nightly, continue lithium 150 mg alternating with 300 mg every other day. Asks for alprazolam prn anxiety.  August 2021 it was discovered she had low sodium and was just recommended she discuss with PCP.  She stopped trazodone and her sodium level improved.  06/26/20 appt noted: Reduced paroxetine to 10 mg from 20 mg daily and stopped trazodone. Low sodium SE caused scary neuro sx.  Had gotten confused and was having tearful episodes.  Sx had developed gradually over time.   Balance issues.   I feel so much better.    Taking salt tablet twice daily. BP is fine.   Emotionally feels good.  On  a cleaning rampage at this time.    Lost a good friend to ALS.   Taking Xanax instead of trazodone for sleep. Plan: No med changes today. Continue lower paroxetine 10 mg daily,  continue lithium 150 mg alternating with 300 mg every other day.  Continue salt tablets Stopped trazodone DT hyponatremia. Asks for alprazolam prn anxiety or insomnia since off the Xanax..  09/26/2020 appointment with following noted: Xanax 0.5 mg HS. Still on salt, paroxetine 10, lithium 300/150 QOD. Asks to take Xanax during the day for anxiety.  Don't seem to have the drive she used to have but once done she feels great.  Some avoidance like social things.  Energy is good. Possibly more anxious at lower dose paroxetine. No crying spells.  H notices reduction in motivation and she doesn't seem like herself. Smokes and needs to stop. Plan: Reduce caffeine bc she gets anxious after taking it and has not reduced as requested. wellbutrin XL 150 mg 1 for 1 week in the AM, then 2 each morning. Repeat serum sodium  11/13/2020 appointment with following noted: Wellbutrin helped me settle down and reduced craving to smoke.  Never got above 150 mg XL.   SE night sweats. Still continues paroxetine 10. Not sleeping as well either with awakening with sweats.  Cut smoking way back to as little as 3-5 per day with less craving from 1/2 to 1 PPD. Emotionally felt better with better task completion.  Better motivation.  Don't feel as anxious in AM and less coffee in the AM.  Plan: Switch to Wellbutrin SR 100 mg AM to reduce SE.  It's helping.  02/06/2021 appointment with the following noted: Not sleeping well with night sweats awakens..Still gets at least 8 hours but prefers 10 h.  Not drowsy. Wonders if related to Wellbutrin.  Feels she needs 1 mg alprazolam to get best response from it.  No hangover.  Felt sleep better with trazodone but afraid of repeat hyponatremia.   Some depression.  Feels a little lost and not real motivated  but not everyday.  Thinks needs job and schedule.  Good enjoyment but less motivation. Kids don't live here and [name] gone a lot on golf trips. Antsy in the morning after up 10-15 mins and reduced caffeine and it helped. Not anxious all day.   Wellbutrin reduced smoking by 50%.  Don't want to give Wellbutrin up.  No daytime XaNAX.    Some occ dep but not usually.  .  Some anxiety over doing things..    Still feels anxious in AM same pattern..  Drinks coffee.  Never had ADD but anxious about doing things she wants to do but doesn't get done..  Patient denies difficulty with sleep initiation or maintenance with trazodone. Denies appetite disturbance.  Patient reports that energy and motivation have been good.  Patient denies any difficulty with concentration.  Patient denies any suicidal ideation.  Past Psychiatric Medication Trials: paroxetine, several intolerant SSRI's lithium Xanax, trazodone low sodium  Review of Systems:  Review of Systems  Constitutional: Negative for fatigue.  Musculoskeletal: Positive for myalgias.  Neurological: Positive for tremors. Negative for dizziness and weakness.  Psychiatric/Behavioral: Positive for depression. Negative for agitation, behavioral problems, confusion, decreased concentration, dysphoric mood, hallucinations, self-injury, sleep disturbance and suicidal ideas. The patient is not nervous/anxious and is not hyperactive.     Medications: I have reviewed the patient's current medications.  Current Outpatient Medications  Medication Sig Dispense Refill  . aspirin EC 81 MG tablet Take 81 mg by mouth daily. Swallow whole.    . carvedilol (COREG) 3.125 MG tablet Take 1 tablet (3.125 mg total) by mouth 2 (two) times daily. 180 tablet 3  . Cholecalciferol (VITAMIN D3) 1.25 MG (50000 UT) CAPS every 14 (fourteen) days.     Marland Kitchen lisinopril (PRINIVIL,ZESTRIL) 40 MG tablet Take 1 tablet (40 mg total) by mouth daily. 30 tablet 2  . lithium carbonate 150 MG  capsule Alternate 2 capsules with 1 capsule every other night 135 capsule 1  . nicotine (NICODERM CQ) 14 mg/24hr patch Place 1 patch (14 mg total) onto the skin daily. 30 patch 3  . PARoxetine (PAXIL) 10 MG tablet Take 1 tablet (10 mg total) by mouth daily. 90 tablet 1  . Pyridoxine HCl (VITAMIN B-6) 500 MG tablet Take 500 mg by mouth as needed.     . rosuvastatin (CRESTOR) 20 MG tablet Take 1 tablet (20 mg total) by mouth daily. 90 tablet 3  . SODIUM CHLORIDE PO Take by mouth in the morning and at bedtime.    . ALPRAZolam (XANAX) 0.5 MG tablet Take 1 tablet (0.5 mg total) by mouth 2 (two) times daily as needed. 135 tablet 1  . buPROPion (WELLBUTRIN SR) 100 MG 12 hr tablet Take 1 tablet (100 mg total) by mouth daily. 90 tablet 1  . cyclobenzaprine (FLEXERIL) 10 MG tablet TK 1 T PO Q 8 H PRN FOR MUSCLE SPASMS (Patient not taking:  Reported on 02/06/2021)     No current facility-administered medications for this visit.    Medication Side Effects: Other: tremor recently better  Allergies:  Allergies  Allergen Reactions  . Sulfonamide Derivatives Hives, Itching and Rash    Past Medical History:  Diagnosis Date  . Acid reflux   . Anxiety   . Atypical lobular hyperplasia of left breast 11/25/13   lumpectomy, 12/10/13  . BCC (basal cell carcinoma)    per pt, never had cancer  . Depression   . Hyperlipidemia   . Hypertension   . Osteopenia   . Thyroid disease 2010   rt thyroid nodule    Family History  Problem Relation Age of Onset  . Cancer Mother        colon-stage 4  . Hypertension Mother   . Colon cancer Mother   . Other Father   . Breast cancer Sister   . Colon polyps Neg Hx   . Esophageal cancer Neg Hx   . Rectal cancer Neg Hx   . Stomach cancer Neg Hx     Social History   Socioeconomic History  . Marital status: Married    Spouse name: Not on file  . Number of children: 2  . Years of education: Not on file  . Highest education level: Not on file  Occupational  History  . Not on file  Tobacco Use  . Smoking status: Current Some Day Smoker    Packs/day: 0.50    Years: 35.00    Pack years: 17.50    Types: Cigarettes  . Smokeless tobacco: Never Used  Vaping Use  . Vaping Use: Former  Substance and Sexual Activity  . Alcohol use: Yes    Alcohol/week: 12.0 - 14.0 standard drinks    Types: 12 - 14 Glasses of wine per week  . Drug use: No  . Sexual activity: Yes    Partners: Male    Birth control/protection: Post-menopausal  Other Topics Concern  . Not on file  Social History Narrative  . Not on file   Social Determinants of Health   Financial Resource Strain: Not on file  Food Insecurity: Not on file  Transportation Needs: Not on file  Physical Activity: Not on file  Stress: Not on file  Social Connections: Not on file  Intimate Partner Violence: Not on file    Past Medical History, Surgical history, Social history, and Family history were reviewed and updated as appropriate.   Please see review of systems for further details on the patient's review from today.   Objective:   Physical Exam:  LMP 09/24/1999   Physical Exam Constitutional:      General: She is not in acute distress.    Appearance: She is well-developed.  Musculoskeletal:        General: No deformity.  Neurological:     Mental Status: She is alert and oriented to person, place, and time.     Cranial Nerves: No dysarthria.     Coordination: Coordination normal.  Psychiatric:        Attention and Perception: Attention and perception normal. She does not perceive auditory or visual hallucinations.        Mood and Affect: Mood is anxious. Mood is not depressed. Affect is not labile, blunt, angry or inappropriate.        Speech: Speech normal. Speech is not rapid and pressured or slurred.        Behavior: Behavior normal. Behavior is cooperative.  Thought Content: Thought content normal. Thought content is not paranoid or delusional. Thought content does  not include homicidal or suicidal ideation. Thought content does not include homicidal or suicidal plan.        Cognition and Memory: Cognition and memory normal.        Judgment: Judgment normal.     Comments: Insight good No evidence for cognitive issues.     Lab Review:     Component Value Date/Time   NA 136 10/13/2020 1314   NA 139 07/19/2014 0903   K 4.5 10/13/2020 1314   K 4.7 07/19/2014 0903   CL 102 10/13/2020 1314   CO2 23 10/13/2020 1314   CO2 24 07/19/2014 0903   GLUCOSE 131 (H) 10/13/2020 1314   GLUCOSE 95 07/19/2014 0903   BUN 20 10/13/2020 1314   BUN 22.8 07/19/2014 0903   CREATININE 0.79 10/13/2020 1314   CREATININE 0.8 07/19/2014 0903   CALCIUM 9.6 10/13/2020 1314   CALCIUM 9.6 07/19/2014 0903   PROT 7.3 07/19/2014 0903   ALBUMIN 4.1 07/19/2014 0903   AST 23 07/19/2014 0903   ALT 21 07/19/2014 0903   ALKPHOS 54 07/19/2014 0903   BILITOT 0.49 07/19/2014 0903   GFRNONAA 53 (L) 06/18/2018 0934   GFRAA 61 06/18/2018 0934       Component Value Date/Time   WBC 5.0 07/19/2014 0903   WBC 5.3 04/05/2011 0923   RBC 3.94 07/19/2014 0903   RBC 4.05 04/05/2011 0923   HGB 12.5 07/19/2014 0903   HCT 37.8 07/19/2014 0903   PLT 370 07/19/2014 0903   MCV 96.0 07/19/2014 0903   MCH 31.8 07/19/2014 0903   MCH 31.9 04/05/2011 0923   MCHC 33.1 07/19/2014 0903   MCHC 33.2 04/05/2011 0923   RDW 12.8 07/19/2014 0903   LYMPHSABS 1.5 07/19/2014 0903   MONOABS 0.6 07/19/2014 0903   EOSABS 0.1 07/19/2014 0903   BASOSABS 0.0 07/19/2014 0903    Lithium Lvl  Date Value Ref Range Status  04/27/2020 0.5 (L) 0.6 - 1.2 mmol/L Final     No results found for: PHENYTOIN, PHENOBARB, VALPROATE, CBMZ   .res Assessment: Plan:    Major depressive disorder, recurrent episode, moderate ([facility]) - Plan: buPROPion (WELLBUTRIN SR) 100 MG 12 hr tablet  Generalized anxiety disorder - Plan: ALPRAZolam (XANAX) 0.5 MG tablet  Cigarette smoker motivated to quit - Plan: buPROPion  (WELLBUTRIN SR) 100 MG 12 hr tablet  Insomnia due to mental condition - Plan: ALPRAZolam (XANAX) 0.5 MG tablet  Hot flashes  Please see After Visit Summary for patient specific instructions.  Overall she remained stable after the reduction in both lithium and paroxetine to current dosages.  Better motivation and task completion and less msoking with Wellbutrin But SE. Disc hot flashes and she doesn't think it's due hormones.  We discussed her history of response to lithium at the lower dose 150 and then the 300 mg a day.  We discussed her side effects of tremor.  It's better now.   The tremor is better with the reduction to the current dose of lithium and the use of B6.  It is now manageable.  Disc option stopping B6 to see if she still needs it. Last lithium level is low normal.  We discussed the question of her diagnosis given at prior history of 2 weeks of hypomania but that may have been related to medications.  The uncertainty whether she truly has major depression versus bipolar disorder remains.  We discussed the risk  of antidepressants causing mood cycling and the advantage to being on a lower dose.  Repeat Sodium 10/13/20 =136 Recent hyponatremia in August as low as 128 improved to 133 after stopping trazodone and adding salt tablets. Repeated twice. Reduced salt tablets to 1 daily from 2 daily.  Continue lower paroxetine 10 mg daily,  continue lithium 150 mg alternating with 300 mg every other day.   Stopped trazodone DT hyponatremia.  Disc option rechallenge.  She's afraid.   Trial black cohush.  Asks for alprazolam prn anxiety or insomnia since off the trazodone. Ok increase to 0.75 mg HS We discussed the short-term risks associated with benzodiazepines including sedation and increased fall risk among others.  Discussed long-term side effect risk including dependence, potential withdrawal symptoms, and the potential eventual dose-related risk of dementia.  But recent studies  from 2020 dispute this association between benzodiazepines and dementia risk. Newer studies in 2020 do not support an association with dementia. Disc SE worsen with edge.  Switch to Wellbutrin SR 100 mg AM to reduce SE.  It's helping. Disc smoking cessation..  Follow-up 4 mos  Lynder Parents MD, DFAPA\  No future appointments.  No orders of the defined types were placed in this encounter.     -------------------------------

## 2021-02-06 NOTE — Patient Instructions (Signed)
Trial black cohush for hot flashes

## 2021-03-20 ENCOUNTER — Encounter (HOSPITAL_BASED_OUTPATIENT_CLINIC_OR_DEPARTMENT_OTHER): Payer: Self-pay | Admitting: *Deleted

## 2021-04-20 ENCOUNTER — Other Ambulatory Visit: Payer: Self-pay | Admitting: Psychiatry

## 2021-04-20 DIAGNOSIS — F39 Unspecified mood [affective] disorder: Secondary | ICD-10-CM

## 2021-06-05 ENCOUNTER — Ambulatory Visit (INDEPENDENT_AMBULATORY_CARE_PROVIDER_SITE_OTHER): Payer: Medicare Other | Admitting: Psychiatry

## 2021-06-05 ENCOUNTER — Encounter: Payer: Self-pay | Admitting: Psychiatry

## 2021-06-05 ENCOUNTER — Other Ambulatory Visit: Payer: Self-pay

## 2021-06-05 DIAGNOSIS — R232 Flushing: Secondary | ICD-10-CM

## 2021-06-05 DIAGNOSIS — Z79899 Other long term (current) drug therapy: Secondary | ICD-10-CM | POA: Diagnosis not present

## 2021-06-05 DIAGNOSIS — E871 Hypo-osmolality and hyponatremia: Secondary | ICD-10-CM

## 2021-06-05 DIAGNOSIS — F5105 Insomnia due to other mental disorder: Secondary | ICD-10-CM | POA: Diagnosis not present

## 2021-06-05 DIAGNOSIS — F331 Major depressive disorder, recurrent, moderate: Secondary | ICD-10-CM | POA: Diagnosis not present

## 2021-06-05 DIAGNOSIS — F411 Generalized anxiety disorder: Secondary | ICD-10-CM

## 2021-06-05 DIAGNOSIS — F17210 Nicotine dependence, cigarettes, uncomplicated: Secondary | ICD-10-CM

## 2021-06-05 MED ORDER — CLONAZEPAM 0.5 MG PO TABS: 0.7500 mg | ORAL_TABLET | Freq: Every day | ORAL | 0 refills | Status: DC

## 2021-06-05 MED ORDER — BUPROPION HCL 75 MG PO TABS: 75.0000 mg | ORAL_TABLET | Freq: Every morning | ORAL | 0 refills | Status: DC

## 2021-06-05 MED ORDER — BUPROPION HCL 100 MG PO TABS: 100.0000 mg | ORAL_TABLET | Freq: Two times a day (BID) | ORAL | 1 refills | Status: DC

## 2021-06-05 NOTE — Progress Notes (Signed)
Robin Barton KH:[id number] 03-Mar-1956 65 y.o.    Subjective:   Patient ID:  Robin Barton is a 65 y.o. (DOB Apr 26, 1956) female.  Chief Complaint:  Chief Complaint  Patient presents with   Follow-up   Depression   Anxiety   Sleeping Problem    Depression        Associated symptoms include myalgias.  Associated symptoms include no decreased concentration, no fatigue and no suicidal ideas. Robin Barton presents to the office today for follow-up mood and anxiety.  seen October 2020.  No meds were changed.  She was doing well psychiatrically.  It was recommended she get a lithium level and BMP and Continue paroxetine 20 mg daily, continue trazodone 50 mg nightly, continue lithium 150 mg alternating with 300 mg every other day.  January 17, 2020 appointment the following is noted: .Ok.  Exposed to virus twice and had to QT.  Had to hlep sister with med probvlems.   Forgot the labs.   Gotten vaccinated. Not taking Xanax.   Sleeps [place] with trazodone. Forgot to get labs after reducing the lithium.  Tremor is much better and manageable with the reduction of lithium.  No worsening of mood with the change. Tremor has gotten better on it's own.  No longer needs B6. Plan: Continue paroxetine 20 mg daily, continue trazodone 50 mg nightly, continue lithium 150 mg alternating with 300 mg every other day. Asks for alprazolam prn anxiety.  August 2021 it was discovered she had low sodium and was just recommended she discuss with PCP.  She stopped trazodone and her sodium level improved.  06/26/20 appt noted: Reduced paroxetine to 10 mg from 20 mg daily and stopped trazodone. Low sodium SE caused scary neuro sx.  Had gotten confused and was having tearful episodes.  Sx had developed gradually over time.   Balance issues.   I feel so much better.    Taking salt tablet twice daily. BP is fine.   Emotionally feels good.  On a cleaning rampage at this time.    Lost a good friend  to ALS.   Taking Xanax instead of trazodone for sleep. Plan: No med changes today. Continue lower paroxetine 10 mg daily,  continue lithium 150 mg alternating with 300 mg every other day.  Continue salt tablets Stopped trazodone DT hyponatremia. Asks for alprazolam prn anxiety or insomnia since off the Xanax..  09/26/2020 appointment with following noted: Xanax 0.5 mg HS. Still on salt, paroxetine 10, lithium 300/150 QOD. Asks to take Xanax during the day for anxiety.  Don't seem to have the drive she used to have but once done she feels great.  Some avoidance like social things.  Energy is good. Possibly more anxious at lower dose paroxetine. No crying spells.  H notices reduction in motivation and she doesn't seem like herself. Smokes and needs to stop. Plan: Reduce caffeine bc she gets anxious after taking it and has not reduced as requested. wellbutrin XL 150 mg 1 for 1 week in the AM, then 2 each morning. Repeat serum sodium  11/13/2020 appointment with following noted: Wellbutrin helped me settle down and reduced craving to smoke.  Never got above 150 mg XL.   SE night sweats. Still continues paroxetine 10. Not sleeping as well either with awakening with sweats.  Cut smoking way back to as little as 3-5 per day with less craving from 1/2 to 1 PPD. Emotionally felt better with better task completion.  Better motivation.  Don't  feel as anxious in AM and less coffee in the AM.  Plan: Switch to Wellbutrin SR 100 mg AM to reduce SE.  It's helping.  02/06/2021 appointment with the following noted: Not sleeping well with night sweats awakens..Still gets at least 8 hours but prefers 10 h.  Not drowsy. Wonders if related to Wellbutrin.  Feels she needs 1 mg alprazolam to get best response from it.  No hangover.  Felt sleep better with trazodone but afraid of repeat hyponatremia.   Some depression.  Feels a little lost and not real motivated but not everyday.  Thinks needs job and schedule.  Good  enjoyment but less motivation. Kids don't live here and Robin Barton gone a lot on golf trips. Antsy in the morning after up 10-15 mins and reduced caffeine and it helped. Not anxious all day.   Wellbutrin reduced smoking by 50%.  Don't want to give Wellbutrin up.  No daytime XaNAX.   Plan: Switch to Wellbutrin SR 100 mg AM to reduce SE.  It's helping. Disc smoking cessation.. Asks for alprazolam prn anxiety or insomnia since off the trazodone. Ok increase to 0.75 mg HS Continue lower paroxetine 10 mg daily,  continue lithium 150 mg alternating with 300 mg every other day.   06/05/21 appt noted:   Fair.  Not sleeping well.  EMA and jus t lays there in the dark.  Then feels like crap the next day.  Happens 5 days per week.  Took trazdone other night without Xanax without more help. Had slept well with trazodone. Night sweats after she awakens.  Tried black [place] intermittently.   Redeuced coffee. Smoking better but hasn't quit.  3-5 per day.   .Still having tremor  Some occ dep but not usually.  .  Some anxiety over doing things..    Still feels anxious in AM same pattern..  Drinks coffee.  Never had ADD but anxious about doing things she wants to do but doesn't get done..   Denies appetite disturbance.  Patient reports that energy and motivation have been good.  Patient denies any difficulty with concentration.  Patient denies any suicidal ideation.  Past Psychiatric Medication Trials: paroxetine, several intolerant SSRI's Wellbutrin SR 100 ? ins lithium Xanax, trazodone low sodium  Review of Systems:  Review of Systems  Constitutional:  Negative for fatigue.  Musculoskeletal:  Positive for myalgias.  Neurological:  Positive for tremors. Negative for dizziness and weakness.  Psychiatric/Behavioral:  Negative for agitation, behavioral problems, confusion, decreased concentration, dysphoric mood, hallucinations, self-injury, sleep disturbance and suicidal ideas. The patient is not nervous/anxious  and is not hyperactive.    Medications: I have reviewed the patient's current medications.  Current Outpatient Medications  Medication Sig Dispense Refill   ALPRAZolam (XANAX) 0.5 MG tablet Take 1 tablet (0.5 mg total) by mouth 2 (two) times daily as needed. 135 tablet 1   aspirin EC 81 MG tablet Take 81 mg by mouth daily. Swallow whole.     buPROPion (WELLBUTRIN) 75 MG tablet Take 1 tablet (75 mg total) by mouth in the morning. 30 tablet 0   carvedilol (COREG) 3.125 MG tablet Take 1 tablet (3.125 mg total) by mouth 2 (two) times daily. 180 tablet 3   Cholecalciferol (VITAMIN D3) 1.25 MG (50000 UT) CAPS every 14 (fourteen) days.      clonazePAM (KLONOPIN) 0.5 MG tablet Take 1.5 tablets (0.75 mg total) by mouth at bedtime. 45 tablet 0   lisinopril (PRINIVIL,ZESTRIL) 40 MG tablet Take 1 tablet (40  mg total) by mouth daily. 30 tablet 2   lithium carbonate 150 MG capsule ALTERNATE 2 CAPSULES WITH 1 CAPSULE EVERY OTHER NIGHT 135 capsule 1   PARoxetine (PAXIL) 10 MG tablet Take 1 tablet (10 mg total) by mouth daily. 90 tablet 1   Pyridoxine HCl (VITAMIN B-6) 500 MG tablet Take 500 mg by mouth as needed.      rosuvastatin (CRESTOR) 20 MG tablet Take 1 tablet (20 mg total) by mouth daily. 90 tablet 3   SODIUM CHLORIDE PO Take by mouth in the morning and at bedtime.     cyclobenzaprine (FLEXERIL) 10 MG tablet TK 1 T PO Q 8 H PRN FOR MUSCLE SPASMS (Patient not taking: No sig reported)     nicotine (NICODERM CQ) 14 mg/24hr patch Place 1 patch (14 mg total) onto the skin daily. (Patient not taking: Reported on 06/05/2021) 30 patch 3   No current facility-administered medications for this visit.    Medication Side Effects: Other: tremor recently better  Allergies:  Allergies  Allergen Reactions   Sulfonamide Derivatives Hives, Itching and Rash    Past Medical History:  Diagnosis Date   Acid reflux    Anxiety    Atypical lobular hyperplasia of left breast 11/25/13   lumpectomy, 12/10/13   BCC  (basal cell carcinoma)    per pt, never had cancer   Depression    Hyperlipidemia    Hypertension    Osteopenia    Thyroid disease 2010   rt thyroid nodule    Family History  Problem Relation Age of Onset   Cancer Mother        colon-stage 4   Hypertension Mother    Colon cancer Mother    Other Father    Breast cancer Sister    Colon polyps Neg Hx    Esophageal cancer Neg Hx    Rectal cancer Neg Hx    Stomach cancer Neg Hx     Social History   Socioeconomic History   Marital status: Married    Spouse name: Not on file   Number of children: 2   Years of education: Not on file   Highest education level: Not on file  Occupational History   Not on file  Tobacco Use   Smoking status: Some Days    Packs/day: 0.50    Years: 35.00    Pack years: 17.50    Types: Cigarettes   Smokeless tobacco: Never  Vaping Use   Vaping Use: Former  Substance and Sexual Activity   Alcohol use: Yes    Alcohol/week: 12.0 - 14.0 standard drinks    Types: 12 - 14 Glasses of wine per week   Drug use: No   Sexual activity: Yes    Partners: Male    Birth control/protection: Post-menopausal  Other Topics Concern   Not on file  Social History Narrative   Not on file   Social Determinants of Health   Financial Resource Strain: Not on file  Food Insecurity: Not on file  Transportation Needs: Not on file  Physical Activity: Not on file  Stress: Not on file  Social Connections: Not on file  Intimate Partner Violence: Not on file    Past Medical History, Surgical history, Social history, and Family history were reviewed and updated as appropriate.   Please see review of systems for further details on the patient's review from today.   Objective:   Physical Exam:  LMP 09/24/1999   Physical Exam Constitutional:  General: She is not in acute distress.    Appearance: She is well-developed.  Musculoskeletal:        General: No deformity.  Neurological:     Mental Status: She  is alert and oriented to person, place, and time.     Cranial Nerves: No dysarthria.     Coordination: Coordination normal.  Psychiatric:        Attention and Perception: Attention and perception normal. She does not perceive auditory or visual hallucinations.        Mood and Affect: Mood is anxious. Mood is not depressed. Affect is not labile, blunt, angry or inappropriate.        Speech: Speech normal. Speech is not rapid and pressured or slurred.        Behavior: Behavior normal. Behavior is cooperative.        Thought Content: Thought content normal. Thought content is not paranoid or delusional. Thought content does not include homicidal or suicidal ideation. Thought content does not include homicidal or suicidal plan.        Cognition and Memory: Cognition and memory normal.        Judgment: Judgment normal.     Comments: Insight good No evidence for cognitive issues.    Lab Review:     Component Value Date/Time   NA 136 10/13/2020 1314   NA 139 07/19/2014 0903   K 4.5 10/13/2020 1314   K 4.7 07/19/2014 0903   CL 102 10/13/2020 1314   CO2 23 10/13/2020 1314   CO2 24 07/19/2014 0903   GLUCOSE 131 (H) 10/13/2020 1314   GLUCOSE 95 07/19/2014 0903   BUN 20 10/13/2020 1314   BUN 22.8 07/19/2014 0903   CREATININE 0.79 10/13/2020 1314   CREATININE 0.8 07/19/2014 0903   CALCIUM 9.6 10/13/2020 1314   CALCIUM 9.6 07/19/2014 0903   PROT 7.3 07/19/2014 0903   ALBUMIN 4.1 07/19/2014 0903   AST 23 07/19/2014 0903   ALT 21 07/19/2014 0903   ALKPHOS 54 07/19/2014 0903   BILITOT 0.49 07/19/2014 0903   GFRNONAA 53 (L) 06/18/2018 0934   GFRAA 61 06/18/2018 0934       Component Value Date/Time   WBC 5.0 07/19/2014 0903   WBC 5.3 04/05/2011 0923   RBC 3.94 07/19/2014 0903   RBC 4.05 04/05/2011 0923   HGB 12.5 07/19/2014 0903   HCT 37.8 07/19/2014 0903   PLT 370 07/19/2014 0903   MCV 96.0 07/19/2014 0903   MCH 31.8 07/19/2014 0903   MCH 31.9 04/05/2011 0923   MCHC 33.1  07/19/2014 0903   MCHC 33.2 04/05/2011 0923   RDW 12.8 07/19/2014 0903   LYMPHSABS 1.5 07/19/2014 0903   MONOABS 0.6 07/19/2014 0903   EOSABS 0.1 07/19/2014 0903   BASOSABS 0.0 07/19/2014 0903    Lithium Lvl  Date Value Ref Range Status  04/27/2020 0.5 (L) 0.6 - 1.2 mmol/L Final     No results found for: PHENYTOIN, PHENOBARB, VALPROATE, CBMZ   .res Assessment: Plan:    Major depressive disorder, recurrent episode, moderate ([place]) - Plan: Basic metabolic panel, Lithium level, buPROPion (WELLBUTRIN) 75 MG tablet, DISCONTINUED: buPROPion (WELLBUTRIN) 100 MG tablet  Hyponatremia - Plan: Basic metabolic panel, Lithium level  Lithium use - Plan: Basic metabolic panel, Lithium level, buPROPion (WELLBUTRIN) 75 MG tablet  Insomnia due to mental condition - Plan: clonazePAM (KLONOPIN) 0.5 MG tablet, buPROPion (WELLBUTRIN) 75 MG tablet  Generalized anxiety disorder - Plan: buPROPion (WELLBUTRIN) 75 MG tablet  Cigarette smoker motivated to  quit  Hot flashes - Plan: buPROPion (WELLBUTRIN) 75 MG tablet  Hypercalcemia - Plan: buPROPion (WELLBUTRIN) 75 MG tablet  Please see After Visit Summary for patient specific instructions.  Overall she remained stable after the reduction in both lithium and paroxetine to current dosages.  Better motivation and task completion and less msoking with Wellbutrin But SE. Disc hot flashes and she doesn't think it's due hormones.  We discussed her history of response to lithium at the lower dose 150 and then the 300 mg a day.  We discussed her side effects of tremor.  It's better now.   The tremor is better with the reduction to the current dose of lithium and the use of B6.  It is now manageable.  Disc option stopping B6 to see if she still needs it. Last lithium level is low normal.  We discussed the question of her diagnosis given at prior history of 2 weeks of hypomania but that may have been related to medications.  The uncertainty whether she truly has  major depression versus bipolar disorder remains.  We discussed the risk of antidepressants causing mood cycling and the advantage to being on a lower dose.  Repeat Sodium 10/13/20 =136, 6 mos ago.  Repeat again. Check lithium level. Recent hyponatremia in August as low as 128 improved to 133 after stopping trazodone and adding salt tablets. Repeated twice. Reduced salt tablets to 1 daily from 2 daily.  Continue lower paroxetine 10 mg daily,  continue lithium 150 mg alternating with 300 mg every other day.   Stopped trazodone DT hyponatremia.  Disc option rechallenge.  She's afraid.   Trial black cohush.  Asks for alprazolam prn anxiety or insomnia since off the trazodone. Ok increase to 0.75 mg HS If switch to Wellbutrin does not help sleep then swich to clonazepam. We discussed the short-term risks associated with benzodiazepines including sedation and increased fall risk among others.  Discussed long-term side effect risk including dependence, potential withdrawal symptoms, and the potential eventual dose-related risk of dementia.  But recent studies from 2020 dispute this association between benzodiazepines and dementia risk. Newer studies in 2020 do not support an association with dementia. Disc SE worsen with edge.  Switch to Wellbutrin IR 75 mg AM to reduce insomnia SE.  It's helping. Disc smoking cessation..  If not better then consider Abilify potentiation  Follow-up 2 mos  Lynder Parents MD, DFAPA\  Future Appointments  Date Time Provider [place]  08/31/2021  9:45 AM Megan Salon, MD [facility]  10/05/2021  8:00 AM Freada Bergeron, MD [facility]    Orders Placed This Encounter  Procedures   Basic metabolic panel   Lithium level      -------------------------------

## 2021-06-09 ENCOUNTER — Other Ambulatory Visit: Payer: Self-pay | Admitting: Psychiatry

## 2021-06-09 DIAGNOSIS — F5105 Insomnia due to other mental disorder: Secondary | ICD-10-CM

## 2021-06-09 DIAGNOSIS — F411 Generalized anxiety disorder: Secondary | ICD-10-CM

## 2021-06-22 LAB — BASIC METABOLIC PANEL
BUN/Creatinine Ratio: 37 (calc) — ABNORMAL HIGH (ref 6–22)
BUN: 30 mg/dL — ABNORMAL HIGH (ref 7–25)
CO2: 22 mmol/L (ref 20–32)
Calcium: 10 mg/dL (ref 8.6–10.4)
Chloride: 104 mmol/L (ref 98–110)
Creat: 0.81 mg/dL (ref 0.50–1.05)
Glucose, Bld: 76 mg/dL (ref 65–139)
Potassium: 4.9 mmol/L (ref 3.5–5.3)
Sodium: 135 mmol/L (ref 135–146)

## 2021-06-22 LAB — LITHIUM LEVEL: Lithium Lvl: 0.6 mmol/L (ref 0.6–1.2)

## 2021-06-26 ENCOUNTER — Other Ambulatory Visit: Payer: Self-pay | Admitting: Psychiatry

## 2021-06-26 DIAGNOSIS — F39 Unspecified mood [affective] disorder: Secondary | ICD-10-CM

## 2021-06-26 DIAGNOSIS — F411 Generalized anxiety disorder: Secondary | ICD-10-CM

## 2021-07-07 ENCOUNTER — Other Ambulatory Visit: Payer: Self-pay | Admitting: Psychiatry

## 2021-07-07 DIAGNOSIS — F39 Unspecified mood [affective] disorder: Secondary | ICD-10-CM

## 2021-07-07 DIAGNOSIS — F411 Generalized anxiety disorder: Secondary | ICD-10-CM

## 2021-07-09 ENCOUNTER — Other Ambulatory Visit: Payer: Self-pay | Admitting: Psychiatry

## 2021-07-09 DIAGNOSIS — Z79899 Other long term (current) drug therapy: Secondary | ICD-10-CM

## 2021-07-09 DIAGNOSIS — F411 Generalized anxiety disorder: Secondary | ICD-10-CM

## 2021-07-09 DIAGNOSIS — F331 Major depressive disorder, recurrent, moderate: Secondary | ICD-10-CM

## 2021-07-09 DIAGNOSIS — F5105 Insomnia due to other mental disorder: Secondary | ICD-10-CM

## 2021-07-09 DIAGNOSIS — R232 Flushing: Secondary | ICD-10-CM

## 2021-08-09 ENCOUNTER — Other Ambulatory Visit: Payer: Self-pay | Admitting: Psychiatry

## 2021-08-09 DIAGNOSIS — F331 Major depressive disorder, recurrent, moderate: Secondary | ICD-10-CM

## 2021-08-09 DIAGNOSIS — Z79899 Other long term (current) drug therapy: Secondary | ICD-10-CM

## 2021-08-09 DIAGNOSIS — F411 Generalized anxiety disorder: Secondary | ICD-10-CM

## 2021-08-09 DIAGNOSIS — F5105 Insomnia due to other mental disorder: Secondary | ICD-10-CM

## 2021-08-09 DIAGNOSIS — R232 Flushing: Secondary | ICD-10-CM

## 2021-08-14 ENCOUNTER — Ambulatory Visit: Payer: Medicare Other | Admitting: Psychiatry

## 2021-08-31 ENCOUNTER — Other Ambulatory Visit (HOSPITAL_COMMUNITY)
Admission: RE | Admit: 2021-08-31 | Discharge: 2021-08-31 | Disposition: A | Discharge status: Home / Self Care | Payer: Medicare Other | Source: Ambulatory Visit | Attending: Obstetrics & Gynecology | Admitting: Obstetrics & Gynecology

## 2021-08-31 ENCOUNTER — Encounter (HOSPITAL_BASED_OUTPATIENT_CLINIC_OR_DEPARTMENT_OTHER): Payer: Self-pay | Admitting: Obstetrics & Gynecology

## 2021-08-31 ENCOUNTER — Other Ambulatory Visit: Payer: Self-pay

## 2021-08-31 ENCOUNTER — Ambulatory Visit (INDEPENDENT_AMBULATORY_CARE_PROVIDER_SITE_OTHER): Payer: Medicare Other | Admitting: Obstetrics & Gynecology

## 2021-08-31 VITALS — BP 114/67 | HR 66 | Ht 61.0 in | Wt 119.2 lb

## 2021-08-31 DIAGNOSIS — Z780 Asymptomatic menopausal state: Secondary | ICD-10-CM | POA: Diagnosis not present

## 2021-08-31 DIAGNOSIS — D0502 Lobular carcinoma in situ of left breast: Secondary | ICD-10-CM | POA: Diagnosis not present

## 2021-08-31 DIAGNOSIS — Z124 Encounter for screening for malignant neoplasm of cervix: Secondary | ICD-10-CM | POA: Insufficient documentation

## 2021-08-31 DIAGNOSIS — Z800 Family history of malignant neoplasm of digestive organs: Secondary | ICD-10-CM

## 2021-08-31 DIAGNOSIS — Z9189 Other specified personal risk factors, not elsewhere classified: Secondary | ICD-10-CM

## 2021-08-31 NOTE — Progress Notes (Signed)
65 y.o. G5P0002 Married White or Caucasian female here for breast and pelvic exam.  I am also following her for breast cancer hx.  Denies vaginal bleeding.  Reports she doesn't feel "great".  Not sleeping as well as she used to in the past.  Has used Trazodone in the past.  Followed by psychiatrist.  Medications have been changed.    She has questions about her last colonoscopy and follow up.  We discussed this today.  Patient's last menstrual period was 09/24/1999.          Sexually active: Yes.    H/O STD:  no  Health Maintenance: PCP:  Dr. Ardeth Perfect.  Last wellness appt was in the spring.  Did blood work at that appt:  yes Vaccines are up to date:  yes Colonoscopy:  03/15/2019.  Follow up 7 years recommended. MMG:  03/09/2021 BMD:  01/20/2020.  Reviewed with pt today. Last pap smear:  02/18/2019 Negative.   H/o abnormal pap smear:  no   reports that she has been smoking cigarettes. She has a 17.50 pack-year smoking history. She has never used smokeless tobacco. She reports current alcohol use of about 12.0 - 14.0 standard drinks per week. She reports that she does not use drugs.  Past Medical History:  Diagnosis Date   Acid reflux    Anxiety    Atypical lobular hyperplasia of left breast 11/25/13   lumpectomy, 12/10/13   BCC (basal cell carcinoma)    per pt, never had cancer   Depression    Hyperlipidemia    Hypertension    Osteopenia    Thyroid disease 2010   rt thyroid nodule    Past Surgical History:  Procedure Laterality Date   BREAST LUMPECTOMY WITH RADIOACTIVE SEED LOCALIZATION Left 12/10/2013   Procedure: BREAST LUMPECTOMY WITH RADIOACTIVE SEED LOCALIZATION;  Surgeon: [name], MD;  Location: [facility];  Service: General;  Laterality: Left;   BUNIONECTOMY  2/13 left foot and toe straightened   BUNIONECTOMY  3/13 right foot and toe straightened   COLONOSCOPY     NASAL SEPTUM SURGERY  1982    Current Outpatient Medications  Medication Sig  Dispense Refill   ALPRAZolam (XANAX) 0.5 MG tablet TAKE 1 TABLET(0.5 MG) BY MOUTH TWICE DAILY AS NEEDED 45 tablet 1   aspirin EC 81 MG tablet Take 81 mg by mouth daily. Swallow whole.     buPROPion (WELLBUTRIN) 75 MG tablet TAKE 1 TABLET(75 MG) BY MOUTH IN THE MORNING 30 tablet 0   carvedilol (COREG) 3.125 MG tablet Take 1 tablet (3.125 mg total) by mouth 2 (two) times daily. 180 tablet 3   Cholecalciferol (VITAMIN D3) 1.25 MG (50000 UT) CAPS every 14 (fourteen) days.      clonazePAM (KLONOPIN) 0.5 MG tablet Take 1.5 tablets (0.75 mg total) by mouth at bedtime. 45 tablet 0   lisinopril (PRINIVIL,ZESTRIL) 40 MG tablet Take 1 tablet (40 mg total) by mouth daily. 30 tablet 2   lithium carbonate 150 MG capsule ALTERNATE 2 CAPSULES WITH 1 CAPSULE EVERY OTHER NIGHT 135 capsule 1   PARoxetine (PAXIL) 10 MG tablet TAKE 1 TABLET(10 MG) BY MOUTH DAILY 90 tablet 0   Pyridoxine HCl (VITAMIN B-6) 500 MG tablet Take 500 mg by mouth as needed.      rosuvastatin (CRESTOR) 20 MG tablet Take 1 tablet (20 mg total) by mouth daily. 90 tablet 3   SODIUM CHLORIDE PO Take by mouth in the morning and at bedtime.  cyclobenzaprine (FLEXERIL) 10 MG tablet TK 1 T PO Q 8 H PRN FOR MUSCLE SPASMS (Patient not taking: Reported on 02/06/2021)     nicotine (NICODERM CQ) 14 mg/24hr patch Place 1 patch (14 mg total) onto the skin daily. (Patient not taking: Reported on 06/05/2021) 30 patch 3   No current facility-administered medications for this visit.    Family History  Problem Relation Age of Onset   Cancer Mother        colon-stage 4   Hypertension Mother    Colon cancer Mother    Other Father    Breast cancer Sister    Colon polyps Neg Hx    Esophageal cancer Neg Hx    Rectal cancer Neg Hx    Stomach cancer Neg Hx     Review of Systems  All other systems reviewed and are negative.  Exam:   BP 114/67 (BP Location: Right Arm, Patient Position: Sitting, Cuff Size: Normal)   Pulse 66   Ht 5\' 1"  (1.549 m)  Comment: reported  Wt 119 lb 3.2 oz (54.1 kg)   LMP 09/24/1999   BMI 22.52 kg/m   Height: 5\' 1"  (154.9 cm) (reported)  General appearance: alert, cooperative and appears stated age Breasts: normal appearance, no masses or tenderness, well healed breast scar present on  left Abdomen: soft, non-tender; bowel sounds normal; no masses,  no organomegaly Lymph nodes: Cervical, supraclavicular, and axillary nodes normal.  No abnormal inguinal nodes palpated Neurologic: Grossly normal  Pelvic: External genitalia:  no lesions              Urethra:  normal appearing urethra with no masses, tenderness or lesions              Bartholins and Skenes: normal                 Vagina: normal appearing vagina with atrophic changes and no discharge, no lesions              Cervix: no lesions              Pap taken: Yes.   Bimanual Exam:  Uterus:  normal size, contour, position, consistency, mobility, non-tender              Adnexa: normal adnexa and no mass, fullness, tenderness               Rectovaginal: Confirms               Anus:  normal sphincter tone, no lesions  Chaperone, Octaviano Batty, CMA, was present for exam.  Assessment/Plan: 1. GYN exam for high-risk Medicare patient - pap smear obtained today - MMG up to date - BMD 2021 - colonoscopy 2020 - lab work done with PCP - vaccines reviewed  2. Post-menopausal - no HRT  3. 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 Lindi Adie - followed for 5 years  4. Family history of colon cancer - in her mother  35. Osteopenia - plan BMD next year

## 2021-09-03 LAB — CYTOLOGY - PAP: Diagnosis: NEGATIVE

## 2021-09-05 ENCOUNTER — Other Ambulatory Visit: Payer: Self-pay | Admitting: Psychiatry

## 2021-09-05 DIAGNOSIS — F411 Generalized anxiety disorder: Secondary | ICD-10-CM

## 2021-09-05 DIAGNOSIS — F5105 Insomnia due to other mental disorder: Secondary | ICD-10-CM

## 2021-09-05 NOTE — Telephone Encounter (Signed)
Filled 11/10 appt on 10/16/21

## 2021-09-19 ENCOUNTER — Other Ambulatory Visit: Payer: Self-pay

## 2021-09-19 ENCOUNTER — Other Ambulatory Visit: Payer: Self-pay | Admitting: Psychiatry

## 2021-09-19 DIAGNOSIS — F5105 Insomnia due to other mental disorder: Secondary | ICD-10-CM

## 2021-09-19 DIAGNOSIS — Z79899 Other long term (current) drug therapy: Secondary | ICD-10-CM

## 2021-09-19 DIAGNOSIS — F39 Unspecified mood [affective] disorder: Secondary | ICD-10-CM

## 2021-09-19 DIAGNOSIS — F331 Major depressive disorder, recurrent, moderate: Secondary | ICD-10-CM

## 2021-09-19 DIAGNOSIS — R232 Flushing: Secondary | ICD-10-CM

## 2021-09-19 DIAGNOSIS — F411 Generalized anxiety disorder: Secondary | ICD-10-CM

## 2021-09-19 MED ORDER — ROSUVASTATIN CALCIUM 20 MG PO TABS: 20.0000 mg | ORAL_TABLET | Freq: Every day | ORAL | 0 refills | Status: DC

## 2021-10-04 NOTE — Progress Notes (Signed)
Cardiology Office Note:    Date:  10/05/2021   ID:  Robin Barton, DOB 1955-12-20, MRN [id number]  PCP:  Velna Hatchet, MD  [facility] Cardiologist:  None  [facility] Electrophysiologist:  None   Referring MD: Velna Hatchet, MD    History of Present Illness:    Robin Barton is a 66 y.o. female with a hx of GERD, anxiety, depression, HLD, HTN and elevated coronary calcium score who returns to clinic for follow-up.  Patient was seen in clinic on 07/27/20 where she was doing well. No exertional symptoms. Calcium score 702. TTE was obtained which showed normal BiV function, no significant valvular abnormalities. She was continued on ASA, her crestor was increased to 20mg  daily, and her atenolol was changed to coreg. She now returns to clinic for follow-up.  Last seen in clinic on 11/01/20 where she was doing well. Was working on smoking cessation.  Today, the patient states that she was doing overall well. 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 Currently smoking 3-4 cigarettes/day. No chest pain, SOB, lightheadedness, dizziness or syncope. Has been compliant with medications. Remains active without anginal symptoms. Has developed a tremor and is taking B6 which helps.   Past Medical History:  Diagnosis Date   Acid reflux    Anxiety    Atypical lobular hyperplasia of left breast 11/25/13   lumpectomy, 12/10/13   BCC (basal cell carcinoma)    per pt, never had cancer   Depression    Hyperlipidemia    Hypertension    Osteopenia    Thyroid disease 2010   rt thyroid nodule    Past Surgical History:  Procedure Laterality Date   BREAST LUMPECTOMY WITH RADIOACTIVE SEED LOCALIZATION Left 12/10/2013   Procedure: BREAST LUMPECTOMY WITH RADIOACTIVE SEED LOCALIZATION;  Surgeon: [name], MD;  Location: [facility];  Service: General;  Laterality: Left;    BUNIONECTOMY  2/13 left foot and toe straightened   BUNIONECTOMY  3/13 right foot and toe straightened   COLONOSCOPY     NASAL SEPTUM SURGERY  1982    Current Medications: Current Meds  Medication Sig   ALPRAZolam (XANAX) 0.5 MG tablet TAKE 1 TABLET(0.5 MG) BY MOUTH TWICE DAILY AS NEEDED   aspirin EC 81 MG tablet Take 81 mg by mouth daily. Swallow whole.   buPROPion (WELLBUTRIN) 75 MG tablet TAKE 1 TABLET(75 MG) BY MOUTH IN THE MORNING   carvedilol (COREG) 3.125 MG tablet Take 1 tablet (3.125 mg total) by mouth 2 (two) times daily.   Cholecalciferol (VITAMIN D3) 1.25 MG (50000 UT) CAPS every 14 (fourteen) days.    clonazePAM (KLONOPIN) 0.5 MG tablet Take 1.5 tablets (0.75 mg total) by mouth at bedtime.   cyclobenzaprine (FLEXERIL) 10 MG tablet    lisinopril (PRINIVIL,ZESTRIL) 40 MG tablet Take 1 tablet (40 mg total) by mouth daily.   lithium carbonate 150 MG capsule ALTERNATE 2 CAPSULES WITH 1 CAPSULE EVERY OTHER NIGHT   nicotine (NICODERM CQ) 14 mg/24hr patch Place 1 patch (14 mg total) onto the skin daily.   PARoxetine (PAXIL) 10 MG tablet TAKE 1 TABLET(10 MG) BY MOUTH DAILY   Pyridoxine HCl (VITAMIN B-6) 500 MG tablet Take 500 mg by mouth as needed.    rosuvastatin (CRESTOR) 20 MG tablet Take 1 tablet (20 mg total) by mouth daily. Please keep upcoming appt in January 2023 with Dr. Johney Frame before anymore refills. Thank  you   SODIUM CHLORIDE PO Take by mouth in the morning and at bedtime.     Allergies:   Sulfonamide derivatives   Social History   Socioeconomic History   Marital status: Married    Spouse name: Not on file   Number of children: 2   Years of education: Not on file   Highest education level: Not on file  Occupational History   Not on file  Tobacco Use   Smoking status: Some Days    Packs/day: 0.50    Years: 35.00    Pack years: 17.50    Types: Cigarettes   Smokeless tobacco: Never  Vaping Use   Vaping Use: Former  Substance and Sexual Activity   Alcohol  use: Yes    Alcohol/week: 12.0 - 14.0 standard drinks    Types: 12 - 14 Glasses of wine per week   Drug use: No   Sexual activity: Yes    Partners: Male    Birth control/protection: Post-menopausal  Other Topics Concern   Not on file  Social History Narrative   Not on file   Social Determinants of Health   Financial Resource Strain: Not on file  Food Insecurity: Not on file  Transportation Needs: Not on file  Physical Activity: Not on file  Stress: Not on file  Social Connections: Not on file     Family History: The patient's family history includes Breast cancer in her sister; Cancer in her mother; Colon cancer in her mother; Hypertension in her mother; Other in her father. There is no history of Colon polyps, Esophageal cancer, Rectal cancer, or Stomach cancer.  ROS:   Please see the history of present illness.    Review of Systems  Constitutional:  Negative for chills and fever.  HENT:  Negative for nosebleeds.   Eyes:  Negative for blurred vision and redness.  Respiratory:  Negative for shortness of breath.   Cardiovascular:  Negative for chest pain, palpitations, orthopnea, claudication, leg swelling and PND.  Gastrointestinal:  Negative for blood in stool, melena, nausea and vomiting.  Genitourinary:  Negative for frequency and hematuria.  Musculoskeletal:  Negative for falls.  Neurological:  Positive for tremors. Negative for dizziness and loss of consciousness.  Psychiatric/Behavioral:  Negative for substance abuse.    EKGs/Labs/Other Studies Reviewed:    The following studies were reviewed today:  TTE 08/24/20: IMPRESSIONS   1. Left ventricular ejection fraction, by estimation, is 60 to 65%. The  left ventricle has normal function. The left ventricle has no regional  wall motion abnormalities. Left ventricular diastolic parameters were  normal.   2. Right ventricular systolic function is normal. The right ventricular  size is normal. There is normal pulmonary  artery systolic pressure.   3. The mitral valve is normal in structure. No evidence of mitral valve  regurgitation. No evidence of mitral stenosis.   4. The aortic valve is normal in structure. Aortic valve regurgitation is  not visualized. No aortic stenosis is present.   5. The inferior vena cava is normal in size with greater than 50%  respiratory variability, suggesting right atrial pressure of 3 mmHg.  CT 07/05/20: CORONARY CALCIUM SCORES:   Left Main: 0   LAD: 274   LCx: 177   RCA: 250   Total Agatston Score: 702   MESA database percentile: 97   AORTA MEASUREMENTS:   Ascending Aorta: 31 mm   Descending Aorta: 23 mm   OTHER FINDINGS:   Heart is normal size. No  adenopathy. Visualized lungs clear. No effusions. Imaging into the upper abdomen demonstrates no acute findings. Chest wall soft tissues are unremarkable. No acute bony abnormality.   IMPRESSION: The observed calcium score of 702 is at the percentile 97 for subjects of the same age, gender and race/ethnicity who are free of clinical cardiovascular disease and treated diabetes.   No acute or significant extracardiac abnormality  Recent Labs: 06/21/2021: BUN 30; Creat 0.81; Potassium 4.9; Sodium 135  Recent Lipid Panel    Component Value Date/Time   CHOL 186 07/19/2014 0903   TRIG 40 07/19/2014 0903   HDL 98 07/19/2014 0903   CHOLHDL 1.9 07/19/2014 0903   VLDL 8 07/19/2014 0903   LDLCALC 80 07/19/2014 0903     Physical Exam:    VS:  BP 126/78    Pulse 60    Ht 5\' 1"  (1.549 m)    Wt 120 lb (54.4 kg)    LMP 09/24/1999    SpO2 95%    BMI 22.67 kg/m     Wt Readings from Last 3 Encounters:  10/05/21 120 lb (54.4 kg)  08/31/21 119 lb 3.2 oz (54.1 kg)  11/01/20 112 lb (50.8 kg)     GEN:  Well nourished, well developed in no acute distress HEENT: Normal NECK: No JVD; No carotid bruits CARDIAC: RRR, soft systolic murmur. No rubs or gallops RESPIRATORY:  Clear to auscultation without rales,  wheezing or rhonchi  ABDOMEN: Soft, non-tender, non-distended MUSCULOSKELETAL:  No edema; No deformity  SKIN: Warm and dry NEUROLOGIC:  Alert and oriented x 3 PSYCHIATRIC:  Normal affect   ASSESSMENT:    1. Coronary artery disease involving native coronary artery of native heart without angina pectoris   2. Primary hypertension   3. Pure hypercholesterolemia   4. Tobacco abuse    PLAN:    In order of problems listed above:  #Multivessel CAD: #Coronary calcification: Patient with coronary calcium score 702 (97% for age, gender, race matched controls). Calcium noted in all three coronary arteries consistent with multivessel CAD. No symptoms and patient is very active without limitations. ECG without evidence of ischemia or infarct. Risk factors include tobacco use, HTN, HLD and age. -TTE with normal BiV function, no WMA -Continue ASA 81mg  daily -Continue crestor 20mg  daily -Continue coreg 3.125mg  BID -Continue lisinopril 40mg  daily -Patient is motivated to quit smoking again   #HTN: Controlled and at goal <120/80s -Continue coreg 3.125mg  BID -Continue lisinopril 40mg  daily -Monitor at home with goal BP<120s/80s   #HLD: -Continue crestor 20mg  daily -Check lipid panel next week -Goal LDL<70; currently 83 01/2021   #Tobacco use: Had successfully quit but started back again with stress in her life. She is motivated to quit again. -Continue wellbutrin -Nicotine patches   Medication Adjustments/Labs and Tests Ordered: Current medicines are reviewed at length with the patient today.  Concerns regarding medicines are outlined above.  Orders Placed This Encounter  Procedures   Lipid Profile   EKG 12-Lead   No orders of the defined types were placed in this encounter.   Patient Instructions  Medication Instructions:   Your physician recommends that you continue on your current medications as directed. Please refer to the Current Medication list given to you today.  *If  you need a refill on your cardiac medications before your next appointment, please call your pharmacy*   Lab Work:  NEXT [place] OFFICE--CHECK LIPIDS--PLEASE COME FASTING TO THIS LAB APPOINTMENT  If you have labs (blood work) drawn today  and your tests are completely normal, you will receive your results only by: MyChart Message (if you have MyChart) OR A paper copy in the mail If you have any lab test that is abnormal or we need to change your treatment, we will call you to review the results.   Follow-Up: At [facility], you and your health needs are our priority.  As part of our continuing mission to provide you with exceptional heart care, we have created designated Provider Care Teams.  These Care Teams include your primary Cardiologist (physician) and Advanced Practice Providers (APPs -  Physician Assistants and Nurse Practitioners) who all work together to provide you with the care you need, when you need it.  We recommend signing up for the patient portal called "MyChart".  Sign up information is provided on this After Visit Summary.  MyChart is used to connect with patients for Virtual Visits (Telemedicine).  Patients are able to view lab/test results, encounter notes, upcoming appointments, etc.  Non-urgent messages can be sent to your provider as well.   To learn more about what you can do with MyChart, go to [email or web address].    Your next appointment:   6 month(s)  The format for your next appointment:   In Person  Provider:   DR. Johney Frame    Signed, Freada Bergeron, MD  10/05/2021 8:50 AM    [place]

## 2021-10-05 ENCOUNTER — Other Ambulatory Visit: Payer: Self-pay

## 2021-10-05 ENCOUNTER — Encounter: Payer: Self-pay | Admitting: Cardiology

## 2021-10-05 ENCOUNTER — Ambulatory Visit: Payer: Medicare HMO | Admitting: Cardiology

## 2021-10-05 VITALS — BP 126/78 | HR 60 | Ht 61.0 in | Wt 120.0 lb

## 2021-10-05 DIAGNOSIS — I10 Essential (primary) hypertension: Secondary | ICD-10-CM

## 2021-10-05 DIAGNOSIS — I2510 Atherosclerotic heart disease of native coronary artery without angina pectoris: Secondary | ICD-10-CM

## 2021-10-05 DIAGNOSIS — E7800 Pure hypercholesterolemia, unspecified: Secondary | ICD-10-CM

## 2021-10-05 DIAGNOSIS — Z720 Tobacco use: Secondary | ICD-10-CM

## 2021-10-05 NOTE — Patient Instructions (Signed)
Medication Instructions:   Your physician recommends that you continue on your current medications as directed. Please refer to the Current Medication list given to you today.  *If you need a refill on your cardiac medications before your next appointment, please call your pharmacy*   Lab Work:  NEXT [place] OFFICE--CHECK LIPIDS--PLEASE COME FASTING TO THIS LAB APPOINTMENT  If you have labs (blood work) drawn today and your tests are completely normal, you will receive your results only by: [place] (if you have MyChart) OR A paper copy in the mail If you have any lab test that is abnormal or we need to change your treatment, we will call you to review the results.   Follow-Up: At [facility], you and your health needs are our priority.  As part of our continuing mission to provide you with exceptional heart care, we have created designated Provider Care Teams.  These Care Teams include your primary Cardiologist (physician) and Advanced Practice Providers (APPs -  Physician Assistants and Nurse Practitioners) who all work together to provide you with the care you need, when you need it.  We recommend signing up for the patient portal called "MyChart".  Sign up information is provided on this After Visit Summary.  MyChart is used to connect with patients for Virtual Visits (Telemedicine).  Patients are able to view lab/test results, encounter notes, upcoming appointments, etc.  Non-urgent messages can be sent to your provider as well.   To learn more about what you can do with MyChart, go to [email or web address].    Your next appointment:   6 month(s)  The format for your next appointment:   In Person  Provider:   DR. Johney Frame

## 2021-10-09 ENCOUNTER — Other Ambulatory Visit: Payer: Medicare HMO | Admitting: *Deleted

## 2021-10-09 ENCOUNTER — Other Ambulatory Visit: Payer: Self-pay

## 2021-10-09 DIAGNOSIS — E7800 Pure hypercholesterolemia, unspecified: Secondary | ICD-10-CM | POA: Diagnosis not present

## 2021-10-09 DIAGNOSIS — I2510 Atherosclerotic heart disease of native coronary artery without angina pectoris: Secondary | ICD-10-CM

## 2021-10-09 DIAGNOSIS — I10 Essential (primary) hypertension: Secondary | ICD-10-CM | POA: Diagnosis not present

## 2021-10-09 LAB — LIPID PANEL
Chol/HDL Ratio: 1.5 ratio (ref 0.0–4.4)
Cholesterol, Total: 214 mg/dL — ABNORMAL HIGH (ref 100–199)
HDL: 139 mg/dL (ref >39)
LDL Chol Calc (NIH): 64 mg/dL (ref 0–99)
Triglycerides: 60 mg/dL (ref 0–149)
VLDL Cholesterol Cal: 11 mg/dL (ref 5–40)

## 2021-10-16 ENCOUNTER — Ambulatory Visit (INDEPENDENT_AMBULATORY_CARE_PROVIDER_SITE_OTHER): Payer: Medicare HMO | Admitting: Psychiatry

## 2021-10-16 ENCOUNTER — Other Ambulatory Visit: Payer: Self-pay

## 2021-10-16 ENCOUNTER — Encounter: Payer: Self-pay | Admitting: Psychiatry

## 2021-10-16 DIAGNOSIS — F331 Major depressive disorder, recurrent, moderate: Secondary | ICD-10-CM | POA: Diagnosis not present

## 2021-10-16 DIAGNOSIS — E871 Hypo-osmolality and hyponatremia: Secondary | ICD-10-CM

## 2021-10-16 DIAGNOSIS — F411 Generalized anxiety disorder: Secondary | ICD-10-CM | POA: Diagnosis not present

## 2021-10-16 DIAGNOSIS — F5105 Insomnia due to other mental disorder: Secondary | ICD-10-CM | POA: Diagnosis not present

## 2021-10-16 DIAGNOSIS — Z79899 Other long term (current) drug therapy: Secondary | ICD-10-CM | POA: Diagnosis not present

## 2021-10-16 DIAGNOSIS — G251 Drug-induced tremor: Secondary | ICD-10-CM

## 2021-10-16 MED ORDER — BUPROPION HCL ER (SR) 100 MG PO TB12: 100.0000 mg | ORAL_TABLET | Freq: Every day | ORAL | 0 refills | Status: DC

## 2021-10-16 NOTE — Progress Notes (Signed)
Robin Barton [id number] [birth year] 66 y.o.    Subjective:   Patient ID:  Robin Barton is a 66 y.o. (DOB [birth year]) female.  Chief Complaint:  Chief Complaint  Patient presents with   Follow-up   Depression   Anxiety    Depression        Associated symptoms include myalgias.  Associated symptoms include no decreased concentration, no fatigue and no suicidal ideas.  Past medical history includes anxiety.   Anxiety Patient reports no confusion, decreased concentration, dizziness, nervous/anxious behavior or suicidal ideas.    Robin Barton presents to the office today for follow-up mood and anxiety.  seen October 2020.  No meds were changed.  She was doing well psychiatrically.  It was recommended she get a lithium level and BMP and Continue paroxetine 20 mg daily, continue trazodone 50 mg nightly, continue lithium 150 mg alternating with 300 mg every other day.  January 17, 2020 appointment the following is noted: .Ok.  Exposed to virus twice and had to QT.  Had to hlep sister with med probvlems.   Forgot the labs.   Gotten vaccinated. Not taking Xanax.   Sleeps [place] with trazodone. Forgot to get labs after reducing the lithium.  Tremor is much better and manageable with the reduction of lithium.  No worsening of mood with the change. Tremor has gotten better on it's own.  No longer needs B6. Plan: Continue paroxetine 20 mg daily, continue trazodone 50 mg nightly, continue lithium 150 mg alternating with 300 mg every other day. Asks for alprazolam prn anxiety.  August 2021 it was discovered she had low sodium and was just recommended she discuss with PCP.  She stopped trazodone and her sodium level improved.  06/26/20 appt noted: Reduced paroxetine to 10 mg from 20 mg daily and stopped trazodone. Low sodium SE caused scary neuro sx.  Had gotten confused and was having tearful episodes.  Sx had developed gradually over time.   Balance issues.   I feel so  much better.    Taking salt tablet twice daily. BP is fine.   Emotionally feels good.  On a cleaning rampage at this time.    Lost a good friend to ALS.   Taking Xanax instead of trazodone for sleep. Plan: No med changes today. Continue lower paroxetine 10 mg daily,  continue lithium 150 mg alternating with 300 mg every other day.  Continue salt tablets Stopped trazodone DT hyponatremia. Asks for alprazolam prn anxiety or insomnia since off the Xanax..  09/26/2020 appointment with following noted: Xanax 0.5 mg HS. Still on salt, paroxetine 10, lithium 300/150 QOD. Asks to take Xanax during the day for anxiety.  Don't seem to have the drive she used to have but once done she feels great.  Some avoidance like social things.  Energy is good. Possibly more anxious at lower dose paroxetine. No crying spells.  H notices reduction in motivation and she doesn't seem like herself. Smokes and needs to stop. Plan: Reduce caffeine bc she gets anxious after taking it and has not reduced as requested. wellbutrin XL 150 mg 1 for 1 week in the AM, then 2 each morning. Repeat serum sodium  11/13/2020 appointment with following noted: Wellbutrin helped me settle down and reduced craving to smoke.  Never got above 150 mg XL.   SE night sweats. Still continues paroxetine 10. Not sleeping as well either with awakening with sweats.  Cut smoking way back to as little as 3-5 per day  with less craving from 1/2 to 1 PPD. Emotionally felt better with better task completion.  Better motivation.  Don't feel as anxious in AM and less coffee in the AM.  Plan: Switch to Wellbutrin SR 100 mg AM to reduce SE.  It's helping.  02/06/2021 appointment with the following noted: Not sleeping well with night sweats awakens..Still gets at least 8 hours but prefers 10 h.  Not drowsy. Wonders if related to Wellbutrin.  Feels she needs 1 mg alprazolam to get best response from it.  No hangover.  Felt sleep better with trazodone but  afraid of repeat hyponatremia.   Some depression.  Feels a little lost and not real motivated but not everyday.  Thinks needs job and schedule.  Good enjoyment but less motivation. Kids don't live here and [name] gone a lot on golf trips. Antsy in the morning after up 10-15 mins and reduced caffeine and it helped. Not anxious all day.   Wellbutrin reduced smoking by 50%.  Don't want to give Wellbutrin up.  No daytime XaNAX.   Plan: Switch to Wellbutrin SR 100 mg AM to reduce SE.  It's helping. Disc smoking cessation.. Asks for alprazolam prn anxiety or insomnia since off the trazodone. Ok increase to 0.75 mg HS Continue lower paroxetine 10 mg daily,  continue lithium 150 mg alternating with 300 mg every other day.   06/05/21 appt noted:   Fair.  Not sleeping well.  EMA and jus t lays there in the dark.  Then feels like crap the next day.  Happens 5 days per week.  Took trazdone other night without Xanax without more help. Had slept well with trazodone. Night sweats after she awakens.  Tried black [place] intermittently.   Redeuced coffee. Smoking better but hasn't quit.  3-5 per day.   .Still having tremor Some occ dep but not usually.  .  Some anxiety over doing things..    Still feels anxious in AM same pattern..  Drinks coffee.  Never had ADD but anxious about doing things she wants to do but doesn't get done..   Denies appetite disturbance.  Patient reports that energy and motivation have been good.  Patient denies any difficulty with concentration.  Patient denies any suicidal ideation. Plan: Switch to Wellbutrin IR 75 mg AM to reduce insomnia SE.  It's helping. Disc smoking cessation..  10/16/2021 appt noted: [place] pond with goldfish. Still not sleeping very well. Klonopin 0.75 wihtout help. Switched back to Xanax 0.75 mg HS but not as good as trazodone. Sleep adequate hours. But awaken in middle of night thinking about things. Back pain unusually and muscular.  RX Robaxin but didn't take  yet.  Has cyclobenzaprine. Sleep doesn't feel restuful. Mood OK not great.  Should exercise. Some anxiety in the AM  Past Psychiatric Medication Trials: paroxetine, several intolerant SSRI's Wellbutrin SR 100 ? ins lithium Xanax,  trazodone low sodium  History of Neuro eval for tremor.  Review of Systems:  Review of Systems  Constitutional:  Negative for fatigue.  Musculoskeletal:  Positive for back pain and myalgias.  Neurological:  Positive for tremors. Negative for dizziness and weakness.  Psychiatric/Behavioral:  Negative for agitation, behavioral problems, confusion, decreased concentration, dysphoric mood, hallucinations, self-injury, sleep disturbance and suicidal ideas. The patient is not nervous/anxious and is not hyperactive.    Medications: I have reviewed the patient's current medications.  Current Outpatient Medications  Medication Sig Dispense Refill   ALPRAZolam (XANAX) 0.5 MG tablet TAKE 1 TABLET(0.5 MG)  BY MOUTH TWICE DAILY AS NEEDED 45 tablet 1   aspirin EC 81 MG tablet Take 81 mg by mouth daily. Swallow whole.     buPROPion ER (WELLBUTRIN SR) 100 MG 12 hr tablet Take 1 tablet (100 mg total) by mouth daily. 90 tablet 0   carvedilol (COREG) 3.125 MG tablet Take 1 tablet (3.125 mg total) by mouth 2 (two) times daily. 180 tablet 3   Cholecalciferol (VITAMIN D3) 1.25 MG (50000 UT) CAPS every 14 (fourteen) days.      lisinopril (PRINIVIL,ZESTRIL) 40 MG tablet Take 1 tablet (40 mg total) by mouth daily. 30 tablet 2   lithium carbonate 150 MG capsule ALTERNATE 2 CAPSULES WITH 1 CAPSULE EVERY OTHER NIGHT 135 capsule 1   magnesium oxide (MAG-OX) 400 MG tablet Take 400 mg by mouth daily.     nicotine (NICODERM CQ) 14 mg/24hr patch Place 1 patch (14 mg total) onto the skin daily. 30 patch 3   PARoxetine (PAXIL) 10 MG tablet TAKE 1 TABLET(10 MG) BY MOUTH DAILY 90 tablet 0   Pyridoxine HCl (VITAMIN B-6) 500 MG tablet Take 500 mg by mouth as needed.      rosuvastatin (CRESTOR)  20 MG tablet Take 1 tablet (20 mg total) by mouth daily. Please keep upcoming appt in January 2023 with Dr. Johney Frame before anymore refills. Thank you 90 tablet 0   SODIUM CHLORIDE PO Take by mouth in the morning and at bedtime.     clonazePAM (KLONOPIN) 0.5 MG tablet Take 1.5 tablets (0.75 mg total) by mouth at bedtime. (Patient not taking: Reported on 10/16/2021) 45 tablet 0   cyclobenzaprine (FLEXERIL) 10 MG tablet  (Patient not taking: Reported on 10/16/2021)     methocarbamol (ROBAXIN) 500 MG tablet Take 500 mg by mouth 3 (three) times daily. (Patient not taking: Reported on 10/16/2021)     No current facility-administered medications for this visit.    Medication Side Effects: Other: tremor recently better  Allergies:  Allergies  Allergen Reactions   Sulfonamide Derivatives Hives, Itching and Rash    Past Medical History:  Diagnosis Date   Acid reflux    Anxiety    Atypical lobular hyperplasia of left breast 11/25/13   lumpectomy, 12/10/13   BCC (basal cell carcinoma)    per pt, never had cancer   Depression    Hyperlipidemia    Hypertension    Osteopenia    Thyroid disease 2010   rt thyroid nodule    Family History  Problem Relation Age of Onset   Cancer Mother        colon-stage 4   Hypertension Mother    Colon cancer Mother    Other Father    Breast cancer Sister    Colon polyps Neg Hx    Esophageal cancer Neg Hx    Rectal cancer Neg Hx    Stomach cancer Neg Hx     Social History   Socioeconomic History   Marital status: Married    Spouse name: Not on file   Number of children: 2   Years of education: Not on file   Highest education level: Not on file  Occupational History   Not on file  Tobacco Use   Smoking status: Some Days    Packs/day: 0.50    Years: 35.00    Pack years: 17.50    Types: Cigarettes   Smokeless tobacco: Never  Vaping Use   Vaping Use: Former  Substance and Sexual Activity   Alcohol use: Yes  Alcohol/week: 12.0 - 14.0  standard drinks    Types: 12 - 14 Glasses of wine per week   Drug use: No   Sexual activity: Yes    Partners: Male    Birth control/protection: Post-menopausal  Other Topics Concern   Not on file  Social History Narrative   Not on file   Social Determinants of Health   Financial Resource Strain: Not on file  Food Insecurity: Not on file  Transportation Needs: Not on file  Physical Activity: Not on file  Stress: Not on file  Social Connections: Not on file  Intimate Partner Violence: Not on file    Past Medical History, Surgical history, Social history, and Family history were reviewed and updated as appropriate.   Please see review of systems for further details on the patient's review from today.   Objective:   Physical Exam:  LMP 09/24/1999   Physical Exam Constitutional:      General: She is not in acute distress.    Appearance: She is well-developed.  Musculoskeletal:        General: No deformity.  Neurological:     Mental Status: She is alert and oriented to person, place, and time.     Cranial Nerves: No dysarthria.     Coordination: Coordination normal.  Psychiatric:        Attention and Perception: Attention and perception normal. She does not perceive auditory or visual hallucinations.        Mood and Affect: Mood is anxious. Mood is not depressed. Affect is not labile, blunt, angry or inappropriate.        Speech: Speech normal. Speech is not rapid and pressured or slurred.        Behavior: Behavior normal. Behavior is cooperative.        Thought Content: Thought content normal. Thought content is not paranoid or delusional. Thought content does not include homicidal or suicidal ideation. Thought content does not include suicidal plan.        Cognition and Memory: Cognition and memory normal.        Judgment: Judgment normal.     Comments: Insight good No evidence for cognitive issues.    Lab Review:     Component Value Date/Time   NA 135 06/21/2021  1145   NA 139 07/19/2014 0903   K 4.9 06/21/2021 1145   K 4.7 07/19/2014 0903   CL 104 06/21/2021 1145   CO2 22 06/21/2021 1145   CO2 24 07/19/2014 0903   GLUCOSE 76 06/21/2021 1145   GLUCOSE 95 07/19/2014 0903   BUN 30 (H) 06/21/2021 1145   BUN 22.8 07/19/2014 0903   CREATININE 0.81 06/21/2021 1145   CREATININE 0.8 07/19/2014 0903   CALCIUM 10.0 06/21/2021 1145   CALCIUM 9.6 07/19/2014 0903   PROT 7.3 07/19/2014 0903   ALBUMIN 4.1 07/19/2014 0903   AST 23 07/19/2014 0903   ALT 21 07/19/2014 0903   ALKPHOS 54 07/19/2014 0903   BILITOT 0.49 07/19/2014 0903   GFRNONAA 53 (L) 06/18/2018 0934   GFRAA 61 06/18/2018 0934       Component Value Date/Time   WBC 5.0 07/19/2014 0903   WBC 5.3 04/05/2011 0923   RBC 3.94 07/19/2014 0903   RBC 4.05 04/05/2011 0923   HGB 12.5 07/19/2014 0903   HCT 37.8 07/19/2014 0903   PLT 370 07/19/2014 0903   MCV 96.0 07/19/2014 0903   MCH 31.8 07/19/2014 0903   MCH 31.9 04/05/2011 0923   MCHC 33.1 07/19/2014  0903   MCHC 33.2 04/05/2011 0923   RDW 12.8 07/19/2014 0903   LYMPHSABS 1.5 07/19/2014 0903   MONOABS 0.6 07/19/2014 0903   EOSABS 0.1 07/19/2014 0903   BASOSABS 0.0 07/19/2014 0903    Lithium Lvl  Date Value Ref Range Status  06/21/2021 0.6 0.6 - 1.2 mmol/L Final     No results found for: PHENYTOIN, PHENOBARB, VALPROATE, CBMZ   .res Assessment: Plan:    Major depressive disorder, recurrent episode, moderate (HCC) - Plan: buPROPion ER (WELLBUTRIN SR) 100 MG 12 hr tablet  Hyponatremia  Lithium use  Insomnia due to mental condition  Generalized anxiety disorder  Lithium-induced tremor  Please see After Visit Summary for patient specific instructions.  Overall she remained stable after the reduction in both lithium and paroxetine to current dosages.  Better motivation and task completion and less msoking with Wellbutrin  We discussed her history of response to lithium at the lower dose 150 and then the 300 mg a day.  We  discussed her side effects of tremor.  It's better now.   The tremor is better with the reduction to the current dose of lithium and the use of B6.  It is now manageable.  Disc option stopping B6 to see if she still needs it. Last lithium level is low normal.  We discussed the question of her diagnosis given at prior history of 2 weeks of hypomania but that may have been related to medications.  The uncertainty whether she truly has major depression versus bipolar disorder remains.  We discussed the risk of antidepressants causing mood cycling and the advantage to being on a lower dose.  Repeat Sodium 9/22 =135  normal off trazodone Recent hyponatremia in August as low as 128 improved to 133 after stopping trazodone and adding salt tablets. Repeated twice. Reduced salt tablets to 1 daily from 2 daily.  Continue lower paroxetine 10 mg daily,   continue lithium 150 mg alternating with 300 mg every other day.   Stopped trazodone DT hyponatremia.  Disc option rechallenge.  She's afraid.   Trial black cohush.  Try cyclobenzaprine for sleep and back pain.  She has some  Increase Wellbutrin to 100 mg AM  for mood We discussed the short-term risks associated with benzodiazepines including sedation and increased fall risk among others.  Discussed long-term side effect risk including dependence, potential withdrawal symptoms, and the potential eventual dose-related risk of dementia.  But recent studies from 2020 dispute this association between benzodiazepines and dementia risk. Newer studies in 2020 do not support an association with dementia. Disc SE worsen with edge.  Disc smoking cessation.. Watch caffeine for tremor.  If not better then consider Abilify potentiation  Follow-up 2 mos  Lynder Parents MD, DFAPA\  Future Appointments  Date Time Provider [place]  03/05/2022  9:20 AM Freada Bergeron, MD [facility]    No orders of the defined types were placed in  this encounter.     -------------------------------

## 2021-10-16 NOTE — Patient Instructions (Signed)
Try cyclobenzaprine at night for sleep and back pain.

## 2021-10-17 ENCOUNTER — Other Ambulatory Visit: Payer: Self-pay | Admitting: Psychiatry

## 2021-10-17 ENCOUNTER — Telehealth: Payer: Self-pay | Admitting: Psychiatry

## 2021-10-17 DIAGNOSIS — F5105 Insomnia due to other mental disorder: Secondary | ICD-10-CM

## 2021-10-17 MED ORDER — CYCLOBENZAPRINE HCL 10 MG PO TABS: 10.0000 mg | ORAL_TABLET | Freq: Every day | ORAL | 0 refills | Status: DC

## 2021-10-17 NOTE — Telephone Encounter (Signed)
Prescription sent

## 2021-10-17 NOTE — Telephone Encounter (Signed)
Please review

## 2021-10-17 NOTE — Telephone Encounter (Signed)
Pt left a message stating that she doesn't have any cyclobenzapine and would need a script sent into her pharmacy. She was seen yesterday and told dr. Clovis Pu she had some

## 2021-10-24 DIAGNOSIS — M5416 Radiculopathy, lumbar region: Secondary | ICD-10-CM | POA: Diagnosis not present

## 2021-10-26 DIAGNOSIS — M5416 Radiculopathy, lumbar region: Secondary | ICD-10-CM | POA: Diagnosis not present

## 2021-11-06 ENCOUNTER — Other Ambulatory Visit: Payer: Self-pay

## 2021-11-06 MED ORDER — CARVEDILOL 3.125 MG PO TABS: 3.1250 mg | ORAL_TABLET | Freq: Two times a day (BID) | ORAL | 3 refills | Status: DC

## 2021-11-06 NOTE — Telephone Encounter (Signed)
Pt's medication was sent to pt's pharmacy as requested. Confirmation received.  °

## 2021-11-07 DIAGNOSIS — Z79899 Other long term (current) drug therapy: Secondary | ICD-10-CM | POA: Diagnosis not present

## 2021-11-07 DIAGNOSIS — E871 Hypo-osmolality and hyponatremia: Secondary | ICD-10-CM | POA: Diagnosis not present

## 2021-11-07 DIAGNOSIS — I10 Essential (primary) hypertension: Secondary | ICD-10-CM | POA: Diagnosis not present

## 2021-11-07 DIAGNOSIS — E785 Hyperlipidemia, unspecified: Secondary | ICD-10-CM | POA: Diagnosis not present

## 2021-11-07 DIAGNOSIS — R42 Dizziness and giddiness: Secondary | ICD-10-CM | POA: Diagnosis not present

## 2021-11-07 DIAGNOSIS — R531 Weakness: Secondary | ICD-10-CM | POA: Diagnosis not present

## 2021-11-07 DIAGNOSIS — F330 Major depressive disorder, recurrent, mild: Secondary | ICD-10-CM | POA: Diagnosis not present

## 2021-11-07 DIAGNOSIS — G253 Myoclonus: Secondary | ICD-10-CM | POA: Diagnosis not present

## 2021-11-07 DIAGNOSIS — E041 Nontoxic single thyroid nodule: Secondary | ICD-10-CM | POA: Diagnosis not present

## 2021-11-09 DIAGNOSIS — E785 Hyperlipidemia, unspecified: Secondary | ICD-10-CM | POA: Diagnosis not present

## 2021-11-09 DIAGNOSIS — E871 Hypo-osmolality and hyponatremia: Secondary | ICD-10-CM | POA: Diagnosis not present

## 2021-11-16 DIAGNOSIS — E871 Hypo-osmolality and hyponatremia: Secondary | ICD-10-CM | POA: Diagnosis not present

## 2021-11-19 ENCOUNTER — Encounter: Payer: Self-pay | Admitting: *Deleted

## 2021-11-19 ENCOUNTER — Other Ambulatory Visit: Payer: Self-pay | Admitting: Psychiatry

## 2021-11-19 DIAGNOSIS — F5105 Insomnia due to other mental disorder: Secondary | ICD-10-CM

## 2021-11-19 DIAGNOSIS — F411 Generalized anxiety disorder: Secondary | ICD-10-CM

## 2021-11-19 NOTE — Telephone Encounter (Signed)
Last filled 1/13 appt on 4/19

## 2021-12-07 ENCOUNTER — Encounter: Payer: Self-pay | Admitting: Internal Medicine

## 2021-12-10 DIAGNOSIS — I10 Essential (primary) hypertension: Secondary | ICD-10-CM | POA: Diagnosis not present

## 2022-01-09 ENCOUNTER — Ambulatory Visit (INDEPENDENT_AMBULATORY_CARE_PROVIDER_SITE_OTHER): Payer: Medicare HMO | Admitting: Psychiatry

## 2022-01-09 ENCOUNTER — Encounter: Payer: Self-pay | Admitting: Psychiatry

## 2022-01-09 DIAGNOSIS — F411 Generalized anxiety disorder: Secondary | ICD-10-CM | POA: Diagnosis not present

## 2022-01-09 DIAGNOSIS — Z79899 Other long term (current) drug therapy: Secondary | ICD-10-CM

## 2022-01-09 DIAGNOSIS — F331 Major depressive disorder, recurrent, moderate: Secondary | ICD-10-CM | POA: Diagnosis not present

## 2022-01-09 DIAGNOSIS — E871 Hypo-osmolality and hyponatremia: Secondary | ICD-10-CM

## 2022-01-09 DIAGNOSIS — F5105 Insomnia due to other mental disorder: Secondary | ICD-10-CM | POA: Diagnosis not present

## 2022-01-09 DIAGNOSIS — F39 Unspecified mood [affective] disorder: Secondary | ICD-10-CM | POA: Diagnosis not present

## 2022-01-09 MED ORDER — ALPRAZOLAM 0.5 MG PO TABS: 0.5000 mg | ORAL_TABLET | Freq: Two times a day (BID) | ORAL | 1 refills | Status: DC | PRN

## 2022-01-09 MED ORDER — LITHIUM CARBONATE 150 MG PO CAPS: ORAL_CAPSULE | ORAL | 1 refills | Status: DC

## 2022-01-09 MED ORDER — PAROXETINE HCL 10 MG PO TABS: 10.0000 mg | ORAL_TABLET | Freq: Every day | ORAL | 0 refills | Status: DC

## 2022-01-09 MED ORDER — TRAZODONE HCL 50 MG PO TABS: 50.0000 mg | ORAL_TABLET | Freq: Every day | ORAL | 0 refills | Status: DC

## 2022-01-09 MED ORDER — BUPROPION HCL ER (SR) 100 MG PO TB12: 100.0000 mg | ORAL_TABLET | Freq: Every day | ORAL | 0 refills | Status: DC

## 2022-01-09 NOTE — Progress Notes (Signed)
LINDLEY STACHNIK ?[id number] ?Dec 17, 1955 ?66 y.o.  ? ? ?Subjective:  ? ?Patient ID:  MADISEN LUDVIGSEN is a 66 y.o. (DOB 1956/09/13) female. ? ?Chief Complaint:  ?Chief Complaint  ?Patient presents with  ? Follow-up  ? Depression  ? Stress  ? Sleeping Problem  ? ? ?Depression ?       Associated symptoms include myalgias.  Associated symptoms include no decreased concentration, no fatigue and no suicidal ideas.  Past medical history includes anxiety.   ?Anxiety ?Patient reports no confusion, decreased concentration, dizziness, nervous/anxious behavior or suicidal ideas.  ? ? ?RENATE DANH presents to the office today for follow-up mood and anxiety. ? ?seen October 2020.  No meds were changed.  She was doing well psychiatrically.  It was recommended she get a lithium level and BMP and ?Continue paroxetine 20 mg daily, continue trazodone 50 mg nightly, continue lithium 150 mg alternating with 300 mg every other day. ? ?January 17, 2020 appointment the following is noted: ?.[place].  Exposed to virus twice and had to QT.  Had to hlep sister with med probvlems.   ?Forgot the labs.   ?Gotten vaccinated. ?Not taking Xanax.   ?Sleeps Ok with trazodone. ?Forgot to get labs after reducing the lithium.  Tremor is much better and manageable with the reduction of lithium.  No worsening of mood with the change. ?Tremor has gotten better on it's own.  No longer needs B6. ?Plan: Continue paroxetine 20 mg daily, continue trazodone 50 mg nightly, continue lithium 150 mg alternating with 300 mg every other day. ?Asks for alprazolam prn anxiety. ? ?August 2021 it was discovered she had low sodium and was just recommended she discuss with PCP.  She stopped trazodone and her sodium level improved. ? ?06/26/20 appt noted: ?Reduced paroxetine to 10 mg from 20 mg daily and stopped trazodone. ?Low sodium SE caused scary neuro sx.  Had gotten confused and was having tearful episodes.  Sx had developed gradually over time.   Balance  issues.   ?I feel so much better.    ?Taking salt tablet twice daily. ?BP is fine.   ?Emotionally feels good.  On a cleaning rampage at this time.    ?Lost a good friend to ALS.   ?Taking Xanax instead of trazodone for sleep. ?Plan: No med changes today. ?Continue lower paroxetine 10 mg daily,  continue lithium 150 mg alternating with 300 mg every other day.  ?Continue salt tablets ?Stopped trazodone DT hyponatremia. ?Asks for alprazolam prn anxiety or insomnia since off the Xanax.. ? ?09/26/2020 appointment with following noted: ?Xanax 0.5 mg HS. ?Still on salt, paroxetine 10, lithium 300/150 QOD. ?Asks to take Xanax during the day for anxiety.  Don't seem to have the drive she used to have but once done she feels great.  Some avoidance like social things.  Energy is good. Possibly more anxious at lower dose paroxetine. No crying spells.  H notices reduction in motivation and she doesn't seem like herself. ?Smokes and needs to stop. ?Plan: Reduce caffeine bc she gets anxious after taking it and has not reduced as requested. ?wellbutrin XL 150 mg 1 for 1 week in the AM, then 2 each morning. ?Repeat serum sodium ? ?11/13/2020 appointment with following noted: ?Wellbutrin helped me settle down and reduced craving to smoke.  Never got above 150 mg XL.   SE night sweats. ?Still continues paroxetine 10. ?Not sleeping as well either with awakening with sweats.  ?Cut smoking way back to as little  as 3-5 per day with less craving from 1/2 to 1 PPD. ?Emotionally felt better with better task completion.  Better motivation.  Don't feel as anxious in AM and less coffee in the AM.  ?Plan: Switch to Wellbutrin SR 100 mg AM to reduce SE.  It's helping. ? ?02/06/2021 appointment with the following noted: ?Not sleeping well with night sweats awakens..Still gets at least 8 hours but prefers 10 h.  Not drowsy. ?Wonders if related to Wellbutrin.  Feels she needs 1 mg alprazolam to get best response from it.  No hangover.  Felt sleep better  with trazodone but afraid of repeat hyponatremia.   ?Some depression.  Feels a little lost and not real motivated but not everyday.  Thinks needs job and schedule.  Good enjoyment but less motivation. ?Kids don't live here and [name] gone a lot on golf trips. ?Antsy in the morning after up 10-15 mins and reduced caffeine and it helped. Not anxious all day.   ?Wellbutrin reduced smoking by 50%.  Don't want to give Wellbutrin up.  ?No daytime XaNAX.   ?Plan: Switch to Wellbutrin SR 100 mg AM to reduce SE.  It's helping. ?Disc smoking cessation.Marland Kitchen ?Asks for alprazolam prn anxiety or insomnia since off the trazodone. Ok increase to 0.75 mg HS ?Continue lower paroxetine 10 mg daily,  continue lithium 150 mg alternating with 300 mg every other day.  ? ?06/05/21 appt noted:   ?Fair.  Not sleeping well.  EMA and jus t lays there in the dark.  Then feels like crap the next day.  Happens 5 days per week.  Took trazdone other night without Xanax without more help. Had slept well with trazodone. Night sweats after she awakens.  Tried black [place] intermittently.   Redeuced coffee. ?Smoking better but hasn't quit.  3-5 per day.   ?.Still having tremor ?Some occ dep but not usually.  .  Some anxiety over doing things..    Still feels anxious in AM same pattern..  Drinks coffee.  Never had ADD but anxious about doing things she wants to do but doesn't get done..   Denies appetite disturbance.  Patient reports that energy and motivation have been good.  Patient denies any difficulty with concentration.  Patient denies any suicidal ideation. ?Plan: Switch to Wellbutrin IR 75 mg AM to reduce insomnia SE.  It's helping. ?Disc smoking cessation.. ? ?10/16/2021 appt noted: ?Fish pond with goldfish. ?Still not sleeping very well. Klonopin 0.75 wihtout help. ?Switched back to Xanax 0.75 mg HS but not as good as trazodone. ?Sleep adequate hours. But awaken in middle of night thinking about things. ?Back pain unusually and muscular.  RX  Robaxin but didn't take yet.  Has cyclobenzaprine. ?Sleep doesn't feel restuful. ?Mood OK not great.  Should exercise. ?Some anxiety in the AM ?Plan: Try cyclobenzaprine for sleep and back pain.  She has some ?Increase Wellbutrin to 100 mg AM  for mood ? ?01/09/22 appt noted: ?Flexeril didn't work for EMA with worry. ?Last week restarted trazodone 50 mg HS and it is working. ?Taking paroxetine 10 and Wellbutrin SR 100 AM.  Tolerated fine.   ?Energy pretty good. ?Low sodium 130.  Increased salt tablets to 2 daily.   ?Some anxiety in the AM. ?Mood up and down with down lasting 24 hours and seems related to insomnia or not. ?Trazodone works better for sleep than Xanax.   ? ?Past Psychiatric Medication Trials: paroxetine, several intolerant SSRI's ?Wellbutrin SR 100 ? ins ?lithium ?Xanax,  ?  trazodone low sodium ? ?History of Neuro eval for tremor. ? ?Review of Systems:  ?Review of Systems  ?Constitutional:  Negative for fatigue.  ?Musculoskeletal:  Positive for back pain and myalgias.  ?Neurological:  Positive for tremors. Negative for dizziness and weakness.  ?Psychiatric/Behavioral:  Negative for agitation, behavioral problems, confusion, decreased concentration, dysphoric mood, hallucinations, self-injury, sleep disturbance and suicidal ideas. The patient is not nervous/anxious and is not hyperactive.   ? ?Medications: I have reviewed the patient's current medications. ? ?Current Outpatient Medications  ?Medication Sig Dispense Refill  ? aspirin EC 81 MG tablet Take 81 mg by mouth daily. Swallow whole.    ? carvedilol (COREG) 3.125 MG tablet Take 1 tablet (3.125 mg total) by mouth 2 (two) times daily. 180 tablet 3  ? Cholecalciferol (VITAMIN D3) 1.25 MG (50000 UT) CAPS every 14 (fourteen) days.     ? cyclobenzaprine (FLEXERIL) 10 MG tablet Take 1 tablet (10 mg total) by mouth at bedtime. 90 tablet 0  ? lisinopril (PRINIVIL,ZESTRIL) 40 MG tablet Take 1 tablet (40 mg total) by mouth daily. 30 tablet 2  ? magnesium oxide  (MAG-OX) 400 MG tablet Take 400 mg by mouth daily.    ? nicotine (NICODERM CQ) 14 mg/24hr patch Place 1 patch (14 mg total) onto the skin daily. 30 patch 3  ? Pyridoxine HCl (VITAMIN B-6) 500 MG tablet Take

## 2022-02-11 DIAGNOSIS — E559 Vitamin D deficiency, unspecified: Secondary | ICD-10-CM | POA: Diagnosis not present

## 2022-02-11 DIAGNOSIS — M8589 Other specified disorders of bone density and structure, multiple sites: Secondary | ICD-10-CM | POA: Diagnosis not present

## 2022-02-11 DIAGNOSIS — E041 Nontoxic single thyroid nodule: Secondary | ICD-10-CM | POA: Diagnosis not present

## 2022-02-11 DIAGNOSIS — E785 Hyperlipidemia, unspecified: Secondary | ICD-10-CM | POA: Diagnosis not present

## 2022-02-11 DIAGNOSIS — I10 Essential (primary) hypertension: Secondary | ICD-10-CM | POA: Diagnosis not present

## 2022-02-19 DIAGNOSIS — Z1339 Encounter for screening examination for other mental health and behavioral disorders: Secondary | ICD-10-CM | POA: Diagnosis not present

## 2022-02-19 DIAGNOSIS — Z0000 Encounter for general adult medical examination without abnormal findings: Secondary | ICD-10-CM | POA: Diagnosis not present

## 2022-02-19 DIAGNOSIS — M8580 Other specified disorders of bone density and structure, unspecified site: Secondary | ICD-10-CM | POA: Diagnosis not present

## 2022-02-19 DIAGNOSIS — I10 Essential (primary) hypertension: Secondary | ICD-10-CM | POA: Diagnosis not present

## 2022-02-19 DIAGNOSIS — D0502 Lobular carcinoma in situ of left breast: Secondary | ICD-10-CM | POA: Diagnosis not present

## 2022-02-19 DIAGNOSIS — D369 Benign neoplasm, unspecified site: Secondary | ICD-10-CM | POA: Diagnosis not present

## 2022-02-19 DIAGNOSIS — F17200 Nicotine dependence, unspecified, uncomplicated: Secondary | ICD-10-CM | POA: Diagnosis not present

## 2022-02-19 DIAGNOSIS — I2584 Coronary atherosclerosis due to calcified coronary lesion: Secondary | ICD-10-CM | POA: Diagnosis not present

## 2022-02-19 DIAGNOSIS — Z1331 Encounter for screening for depression: Secondary | ICD-10-CM | POA: Diagnosis not present

## 2022-02-19 DIAGNOSIS — F330 Major depressive disorder, recurrent, mild: Secondary | ICD-10-CM | POA: Diagnosis not present

## 2022-02-19 DIAGNOSIS — E041 Nontoxic single thyroid nodule: Secondary | ICD-10-CM | POA: Diagnosis not present

## 2022-02-19 DIAGNOSIS — R7989 Other specified abnormal findings of blood chemistry: Secondary | ICD-10-CM | POA: Diagnosis not present

## 2022-03-05 ENCOUNTER — Ambulatory Visit: Payer: Medicare HMO | Admitting: Cardiology

## 2022-03-12 ENCOUNTER — Encounter: Payer: Self-pay | Admitting: Psychiatry

## 2022-03-12 ENCOUNTER — Ambulatory Visit (INDEPENDENT_AMBULATORY_CARE_PROVIDER_SITE_OTHER): Payer: Medicare HMO | Admitting: Psychiatry

## 2022-03-12 DIAGNOSIS — F5105 Insomnia due to other mental disorder: Secondary | ICD-10-CM | POA: Diagnosis not present

## 2022-03-12 DIAGNOSIS — Z79899 Other long term (current) drug therapy: Secondary | ICD-10-CM | POA: Diagnosis not present

## 2022-03-12 DIAGNOSIS — F331 Major depressive disorder, recurrent, moderate: Secondary | ICD-10-CM | POA: Diagnosis not present

## 2022-03-12 DIAGNOSIS — R748 Abnormal levels of other serum enzymes: Secondary | ICD-10-CM | POA: Diagnosis not present

## 2022-03-12 DIAGNOSIS — F39 Unspecified mood [affective] disorder: Secondary | ICD-10-CM | POA: Diagnosis not present

## 2022-03-12 DIAGNOSIS — F411 Generalized anxiety disorder: Secondary | ICD-10-CM | POA: Diagnosis not present

## 2022-03-12 DIAGNOSIS — E871 Hypo-osmolality and hyponatremia: Secondary | ICD-10-CM | POA: Diagnosis not present

## 2022-03-12 MED ORDER — BUPROPION HCL ER (SR) 100 MG PO TB12: 100.0000 mg | ORAL_TABLET | Freq: Every day | ORAL | 0 refills | Status: DC

## 2022-03-12 MED ORDER — TRAZODONE HCL 50 MG PO TABS: 50.0000 mg | ORAL_TABLET | Freq: Every day | ORAL | 0 refills | Status: DC

## 2022-03-12 MED ORDER — PAROXETINE HCL 10 MG PO TABS: 10.0000 mg | ORAL_TABLET | Freq: Every day | ORAL | 0 refills | Status: DC

## 2022-03-12 NOTE — Progress Notes (Signed)
CARLETTE DARK SX:[id number] 11-Oct-1955 66 y.o.    Subjective:   Patient ID:  Robin Barton is a 66 y.o. (DOB September 25, 1955) female.  Chief Complaint:  Chief Complaint  Patient presents with   Follow-up   Depression   Anxiety   Sleeping Problem    Depression        Associated symptoms include myalgias.  Associated symptoms include no decreased concentration, no fatigue and no suicidal ideas.  Past medical history includes anxiety.   Anxiety Patient reports no confusion, decreased concentration, dizziness, nervous/anxious behavior or suicidal ideas.     KIERAH FRUTOS presents to the office today for follow-up mood and anxiety.  seen October 2020.  No meds were changed.  She was doing well psychiatrically.  It was recommended she get a lithium level and BMP and Continue paroxetine 20 mg daily, continue trazodone 50 mg nightly, continue lithium 150 mg alternating with 300 mg every other day.  January 17, 2020 appointment the following is noted: .Ok.  Exposed to virus twice and had to QT.  Had to hlep sister with med probvlems.   Forgot the labs.   Gotten vaccinated. Not taking Xanax.   Sleeps [place] with trazodone. Forgot to get labs after reducing the lithium.  Tremor is much better and manageable with the reduction of lithium.  No worsening of mood with the change. Tremor has gotten better on it's own.  No longer needs B6. Plan: Continue paroxetine 20 mg daily, continue trazodone 50 mg nightly, continue lithium 150 mg alternating with 300 mg every other day. Asks for alprazolam prn anxiety.  August 2021 it was discovered she had low sodium and was just recommended she discuss with PCP.  She stopped trazodone and her sodium level improved.  06/26/20 appt noted: Reduced paroxetine to 10 mg from 20 mg daily and stopped trazodone. Low sodium SE caused scary neuro sx.  Had gotten confused and was having tearful episodes.  Sx had developed gradually over time.   Balance  issues.   I feel so much better.    Taking salt tablet twice daily. BP is fine.   Emotionally feels good.  On a cleaning rampage at this time.    Lost a good friend to ALS.   Taking Xanax instead of trazodone for sleep. Plan: No med changes today. Continue lower paroxetine 10 mg daily,  continue lithium 150 mg alternating with 300 mg every other day.  Continue salt tablets Stopped trazodone DT hyponatremia. Asks for alprazolam prn anxiety or insomnia since off the Xanax..  09/26/2020 appointment with following noted: Xanax 0.5 mg HS. Still on salt, paroxetine 10, lithium 300/150 QOD. Asks to take Xanax during the day for anxiety.  Don't seem to have the drive she used to have but once done she feels great.  Some avoidance like social things.  Energy is good. Possibly more anxious at lower dose paroxetine. No crying spells.  H notices reduction in motivation and she doesn't seem like herself. Smokes and needs to stop. Plan: Reduce caffeine bc she gets anxious after taking it and has not reduced as requested. wellbutrin XL 150 mg 1 for 1 week in the AM, then 2 each morning. Repeat serum sodium  11/13/2020 appointment with following noted: Wellbutrin helped me settle down and reduced craving to smoke.  Never got above 150 mg XL.   SE night sweats. Still continues paroxetine 10. Not sleeping as well either with awakening with sweats.  Cut smoking way back to as  little as 3-5 per day with less craving from 1/2 to 1 PPD. Emotionally felt better with better task completion.  Better motivation.  Don't feel as anxious in AM and less coffee in the AM.  Plan: Switch to Wellbutrin SR 100 mg AM to reduce SE.  It's helping.  02/06/2021 appointment with the following noted: Not sleeping well with night sweats awakens..Still gets at least 8 hours but prefers 10 h.  Not drowsy. Wonders if related to Wellbutrin.  Feels she needs 1 mg alprazolam to get best response from it.  No hangover.  Felt sleep better  with trazodone but afraid of repeat hyponatremia.   Some depression.  Feels a little lost and not real motivated but not everyday.  Thinks needs job and schedule.  Good enjoyment but less motivation. Kids don't live here and [name] gone a lot on golf trips. Antsy in the morning after up 10-15 mins and reduced caffeine and it helped. Not anxious all day.   Wellbutrin reduced smoking by 50%.  Don't want to give Wellbutrin up.  No daytime XaNAX.   Plan: Switch to Wellbutrin SR 100 mg AM to reduce SE.  It's helping. Disc smoking cessation.. Asks for alprazolam prn anxiety or insomnia since off the trazodone. Ok increase to 0.75 mg HS Continue lower paroxetine 10 mg daily,  continue lithium 150 mg alternating with 300 mg every other day.   06/05/21 appt noted:   Fair.  Not sleeping well.  EMA and jus t lays there in the dark.  Then feels like crap the next day.  Happens 5 days per week.  Took trazdone other night without Xanax without more help. Had slept well with trazodone. Night sweats after she awakens.  Tried black [place] intermittently.   Redeuced coffee. Smoking better but hasn't quit.  3-5 per day.   .Still having tremor Some occ dep but not usually.  .  Some anxiety over doing things..    Still feels anxious in AM same pattern..  Drinks coffee.  Never had ADD but anxious about doing things she wants to do but doesn't get done..   Denies appetite disturbance.  Patient reports that energy and motivation have been good.  Patient denies any difficulty with concentration.  Patient denies any suicidal ideation. Plan: Switch to Wellbutrin IR 75 mg AM to reduce insomnia SE.  It's helping. Disc smoking cessation..  10/16/2021 appt noted: [place] pond with goldfish. Still not sleeping very well. Klonopin 0.75 wihtout help. Switched back to Xanax 0.75 mg HS but not as good as trazodone. Sleep adequate hours. But awaken in middle of night thinking about things. Back pain unusually and muscular.  RX  Robaxin but didn't take yet.  Has cyclobenzaprine. Sleep doesn't feel restuful. Mood OK not great.  Should exercise. Some anxiety in the AM Plan: Try cyclobenzaprine for sleep and back pain.  She has some Increase Wellbutrin to 100 mg AM  for mood  01/09/22 appt noted: Flexeril didn't work for EMA with worry. Last week restarted trazodone 50 mg HS and it is working. Taking paroxetine 10 and Wellbutrin SR 100 AM.  Tolerated fine.   Energy pretty good. Low sodium 130.  Increased salt tablets to 2 daily.   Some anxiety in the AM. Mood up and down with down lasting 24 hours and seems related to insomnia or not. Trazodone works better for sleep than Xanax.    03/12/2022 appointment with the following noted: Continues Wellbutrin SR 100 mg every morning, cyclobenzaprine  10 mg nightly only rarely, lithium 300 alternating with 150 every other night, paroxetine 10 mg, trazodone 50 mg nightly Pretty good usually.   PE early June sodium up to 138.  Stopped sodium after that appt.  BC gets stuck in throat. Don't drink enough water.   Reports high liver enzyme apparently minor bc PCP didn't order more testing. Patient reports stable mood and denies depressed or irritable moods.  Patient denies any recent difficulty with anxiety.  Patient denies difficulty with sleep initiation or maintenance. Denies appetite disturbance.  Patient reports that energy and motivation have been good.  Patient denies any difficulty with concentration.  Patient denies any suicidal ideation.   Past Psychiatric Medication Trials: paroxetine, several intolerant SSRI's Wellbutrin SR 100 ? ins lithium Xanax,  trazodone low sodium  History of Neuro eval for tremor.  Review of Systems:  Review of Systems  Constitutional:  Negative for fatigue.  Musculoskeletal:  Positive for back pain and myalgias.  Neurological:  Negative for dizziness, tremors and weakness.  Psychiatric/Behavioral:  Negative for agitation, behavioral  problems, confusion, decreased concentration, dysphoric mood, hallucinations, self-injury, sleep disturbance and suicidal ideas. The patient is not nervous/anxious and is not hyperactive.     Medications: I have reviewed the patient's current medications.  Current Outpatient Medications  Medication Sig Dispense Refill   ALPRAZolam (XANAX) 0.5 MG tablet Take 1 tablet (0.5 mg total) by mouth 2 (two) times daily as needed for anxiety. 45 tablet 1   aspirin EC 81 MG tablet Take 81 mg by mouth daily. Swallow whole.     carvedilol (COREG) 3.125 MG tablet Take 1 tablet (3.125 mg total) by mouth 2 (two) times daily. 180 tablet 3   Cholecalciferol (VITAMIN D3) 1.25 MG (50000 UT) CAPS every 14 (fourteen) days.      lisinopril (PRINIVIL,ZESTRIL) 40 MG tablet Take 1 tablet (40 mg total) by mouth daily. 30 tablet 2   lithium carbonate 150 MG capsule ALTERNATE 2 CAPSULES WITH 1 CAPSULE EVERY OTHER NIGHT 135 capsule 1   magnesium oxide (MAG-OX) 400 MG tablet Take 400 mg by mouth daily.     nicotine (NICODERM CQ) 14 mg/24hr patch Place 1 patch (14 mg total) onto the skin daily. 30 patch 3   Pyridoxine HCl (VITAMIN B-6) 500 MG tablet Take 500 mg by mouth as needed.      rosuvastatin (CRESTOR) 20 MG tablet Take 1 tablet (20 mg total) by mouth daily. Please keep upcoming appt in January 2023 with Dr. Johney Frame before anymore refills. Thank you 90 tablet 0   SODIUM CHLORIDE PO Take by mouth in the morning and at bedtime.     buPROPion ER (WELLBUTRIN SR) 100 MG 12 hr tablet Take 1 tablet (100 mg total) by mouth daily. 90 tablet 0   cyclobenzaprine (FLEXERIL) 10 MG tablet Take 1 tablet (10 mg total) by mouth at bedtime. (Patient not taking: Reported on 03/12/2022) 90 tablet 0   PARoxetine (PAXIL) 10 MG tablet Take 1 tablet (10 mg total) by mouth daily. 90 tablet 0   traZODone (DESYREL) 50 MG tablet Take 1 tablet (50 mg total) by mouth at bedtime. 90 tablet 0   No current facility-administered medications for this  visit.    Medication Side Effects: Other: tremor recently better  Allergies:  Allergies  Allergen Reactions   Sulfonamide Derivatives Hives, Itching and Rash    Past Medical History:  Diagnosis Date   Acid reflux    Anxiety    Atypical lobular hyperplasia of left  breast 11/25/13   lumpectomy, 12/10/13   BCC (basal cell carcinoma)    per pt, never had cancer   Depression    Hyperlipidemia    Hypertension    Osteopenia    Thyroid disease 2010   rt thyroid nodule    Family History  Problem Relation Age of Onset   Cancer Mother        colon-stage 4   Hypertension Mother    Colon cancer Mother    Other Father    Breast cancer Sister    Colon polyps Neg Hx    Esophageal cancer Neg Hx    Rectal cancer Neg Hx    Stomach cancer Neg Hx     Social History   Socioeconomic History   Marital status: Married    Spouse name: Not on file   Number of children: 2   Years of education: Not on file   Highest education level: Not on file  Occupational History   Not on file  Tobacco Use   Smoking status: Some Days    Packs/day: 0.50    Years: 35.00    Total pack years: 17.50    Types: Cigarettes   Smokeless tobacco: Never  Vaping Use   Vaping Use: Former  Substance and Sexual Activity   Alcohol use: Yes    Alcohol/week: 12.0 - 14.0 standard drinks of alcohol    Types: 12 - 14 Glasses of wine per week   Drug use: No   Sexual activity: Yes    Partners: Male    Birth control/protection: Post-menopausal  Other Topics Concern   Not on file  Social History Narrative   Not on file   Social Determinants of Health   Financial Resource Strain: Not on file  Food Insecurity: Not on file  Transportation Needs: Not on file  Physical Activity: Not on file  Stress: Not on file  Social Connections: Not on file  Intimate Partner Violence: Not on file    Past Medical History, Surgical history, Social history, and Family history were reviewed and updated as appropriate.    Please see review of systems for further details on the patient's review from today.   Objective:   Physical Exam:  LMP 09/24/1999   Physical Exam Constitutional:      General: She is not in acute distress.    Appearance: She is well-developed.  Musculoskeletal:        General: No deformity.  Neurological:     Mental Status: She is alert and oriented to person, place, and time.     Cranial Nerves: No dysarthria.     Coordination: Coordination normal.  Psychiatric:        Attention and Perception: Attention and perception normal. She does not perceive auditory or visual hallucinations.        Mood and Affect: Mood is anxious. Mood is not depressed. Affect is not labile, blunt, angry or inappropriate.        Speech: Speech normal. Speech is not rapid and pressured or slurred.        Behavior: Behavior normal. Behavior is cooperative.        Thought Content: Thought content normal. Thought content is not paranoid or delusional. Thought content does not include homicidal or suicidal ideation. Thought content does not include suicidal plan.        Cognition and Memory: Cognition and memory normal.        Judgment: Judgment normal.     Comments: Insight good No  evidence for cognitive issues.     Lab Review:     Component Value Date/Time   NA 135 06/21/2021 1145   NA 139 07/19/2014 0903   K 4.9 06/21/2021 1145   K 4.7 07/19/2014 0903   CL 104 06/21/2021 1145   CO2 22 06/21/2021 1145   CO2 24 07/19/2014 0903   GLUCOSE 76 06/21/2021 1145   GLUCOSE 95 07/19/2014 0903   BUN 30 (H) 06/21/2021 1145   BUN 22.8 07/19/2014 0903   CREATININE 0.81 06/21/2021 1145   CREATININE 0.8 07/19/2014 0903   CALCIUM 10.0 06/21/2021 1145   CALCIUM 9.6 07/19/2014 0903   PROT 7.3 07/19/2014 0903   ALBUMIN 4.1 07/19/2014 0903   AST 23 07/19/2014 0903   ALT 21 07/19/2014 0903   ALKPHOS 54 07/19/2014 0903   BILITOT 0.49 07/19/2014 0903   GFRNONAA 53 (L) 06/18/2018 0934   GFRAA 61 06/18/2018  0934       Component Value Date/Time   WBC 5.0 07/19/2014 0903   WBC 5.3 04/05/2011 0923   RBC 3.94 07/19/2014 0903   RBC 4.05 04/05/2011 0923   HGB 12.5 07/19/2014 0903   HCT 37.8 07/19/2014 0903   PLT 370 07/19/2014 0903   MCV 96.0 07/19/2014 0903   MCH 31.8 07/19/2014 0903   MCH 31.9 04/05/2011 0923   MCHC 33.1 07/19/2014 0903   MCHC 33.2 04/05/2011 0923   RDW 12.8 07/19/2014 0903   LYMPHSABS 1.5 07/19/2014 0903   MONOABS 0.6 07/19/2014 0903   EOSABS 0.1 07/19/2014 0903   BASOSABS 0.0 07/19/2014 0903    Lithium Lvl  Date Value Ref Range Status  06/21/2021 0.6 0.6 - 1.2 mmol/L Final     No results found for: "PHENYTOIN", "PHENOBARB", "VALPROATE", "CBMZ"   .res Assessment: Plan:    Major depressive disorder, recurrent episode, moderate (HCC) - Plan: buPROPion ER (WELLBUTRIN SR) 100 MG 12 hr tablet, Comprehensive metabolic panel  Generalized anxiety disorder - Plan: PARoxetine (PAXIL) 10 MG tablet  Insomnia due to mental condition - Plan: traZODone (DESYREL) 50 MG tablet  Hyponatremia - Plan: Comprehensive metabolic panel  Lithium use  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  Overall she remained stable after the reduction in both lithium and paroxetine to current dosages.  Better motivation and task completion and less msoking with Wellbutrin. To help mood will focus on sleep problems.  We discussed her history of response to lithium at the lower dose 150 and then the 300 mg a day.  We discussed her side effects of tremor.  It's better now.   The tremor is better with the reduction to the current dose of lithium and the use of B6.  It is now manageable.  Disc option stopping B6 to see if she still needs it. Last lithium level is low normal.  We discussed the question of her diagnosis given at  prior history of 2 weeks of hypomania but that may have been related to medications.  The uncertainty whether she truly has major depression versus bipolar disorder remains.  We discussed the risk of antidepressants causing mood cycling and the advantage to being on a lower dose.  Repeat Sodium 3/23 130 off trazodone Sodium 02/2022 138 on 2 gm daily, then she stopped it. Risk going lower off the salt tablets.  disc risk low sodium. Rec repeat level If remains  low then switch paroxetine to Viibryd.  Answered questions about each med and DDI and risks to liver and kidney  Continue lower paroxetine 10 mg daily,   continue lithium 150 mg alternating with 300 mg every other day.  Counseled patient regarding potential benefits, risks, and side effects of lithium to include potential risk of lithium affecting thyroid and renal function.  Discussed need for periodic lab monitoring to determine drug level and to assess for potential adverse effects.  Counseled patient regarding signs and symptoms of lithium toxicity and advised that they notify office immediately or seek urgent medical attention if experiencing these signs and symptoms.  Patient advised to contact office with any questions or concerns. B6 helped lithium tremor  Trial black cohush.  Continue trazodone 50 mg HS if tolerated.  Last sodium level 130 not on trazodone. Disc off label use of meds.  continue Wellbutrin to 100 mg AM  for mood.  Consider increase if needed. We discussed the short-term risks associated with benzodiazepines including sedation and increased fall risk among others.  Discussed long-term side effect risk including dependence, potential withdrawal symptoms, and the potential eventual dose-related risk of dementia.  But recent studies from 2020 dispute this association between benzodiazepines and dementia risk. Newer studies in 2020 do not support an association with dementia. Disc SE worsen with edge.  Disc smoking  cessation.. Watch caffeine for tremor.  No med changes Continues Wellbutrin SR 100 mg every morning, cyclobenzaprine 10 mg nightly only rarely, lithium 300 alternating with 150 every other night, paroxetine 10 mg, trazodone 50 mg nightly  Check CMP  If not better then consider Abilify potentiation  Follow-up 3-4 mos  Lynder Parents MD, DFAPA\  Future Appointments  Date Time Provider [place]  03/29/2022  3:40 PM Freada Bergeron, MD [id number]    Orders Placed This Encounter  Procedures   Comprehensive metabolic panel      -------------------------------

## 2022-03-13 DIAGNOSIS — D225 Melanocytic nevi of trunk: Secondary | ICD-10-CM | POA: Diagnosis not present

## 2022-03-13 DIAGNOSIS — X32XXXD Exposure to sunlight, subsequent encounter: Secondary | ICD-10-CM | POA: Diagnosis not present

## 2022-03-13 DIAGNOSIS — L570 Actinic keratosis: Secondary | ICD-10-CM | POA: Diagnosis not present

## 2022-03-13 DIAGNOSIS — Z1283 Encounter for screening for malignant neoplasm of skin: Secondary | ICD-10-CM | POA: Diagnosis not present

## 2022-03-13 DIAGNOSIS — L820 Inflamed seborrheic keratosis: Secondary | ICD-10-CM | POA: Diagnosis not present

## 2022-03-28 NOTE — Progress Notes (Signed)
Cardiology Office Note:    Date:  03/29/2022   ID:  Robin Barton, DOB [birth year], MRN [id number]  PCP:  Velna Hatchet, MD  [facility] Cardiologist:  None  [facility] Electrophysiologist:  None   Referring MD: Velna Hatchet, MD    History of Present Illness:    Robin Barton is a 66 y.o. female with a hx of GERD, anxiety, depression, HLD, HTN and elevated coronary calcium score who returns to clinic for follow-up.  Patient was seen in clinic on 07/27/20 where she was doing well. No exertional symptoms. Calcium score 702. TTE was obtained which showed normal BiV function, no significant valvular abnormalities. She was continued on ASA, her crestor was increased to '20mg'$  daily, and her atenolol was changed to coreg. She now returns to clinic for follow-up.  Last seen in clinic on 11/01/20 where she was doing well. Was working on smoking cessation.  Was last seen in clinic on 09/2021 where she had suffered the loss of a friend with ALS. Had started smoking again but was motivated to quit. Otherwise was stable from a CV standpoint.  Today, the patient overall feels okay. Took a muscle relaxant last night and did not sleep at all. Otherwise, she is doing well from a CV standpoint. No chest pain, lightheadedness, dizziness, palpitations. Occasional SOB with the heat. Wants to increase her walking and feels well with activity. Continues to smoke but is continuing to try to quit.    Past Medical History:  Diagnosis Date   Acid reflux    Anxiety    Atypical lobular hyperplasia of left breast 11/25/13   lumpectomy, 12/10/13   BCC (basal cell carcinoma)    per pt, never had cancer   Depression    Hyperlipidemia    Hypertension    Osteopenia    Thyroid disease 2010   rt thyroid nodule    Past Surgical History:  Procedure Laterality Date   BREAST LUMPECTOMY WITH RADIOACTIVE SEED LOCALIZATION Left 12/10/2013   Procedure: BREAST LUMPECTOMY WITH RADIOACTIVE SEED  LOCALIZATION;  Surgeon: [name], MD;  Location: [place];  Service: General;  Laterality: Left;   BUNIONECTOMY  2/13 left foot and toe straightened   BUNIONECTOMY  3/13 right foot and toe straightened   COLONOSCOPY     NASAL SEPTUM SURGERY  1982    Current Medications: Current Meds  Medication Sig   ALPRAZolam (XANAX) 0.5 MG tablet Take 1 tablet (0.5 mg total) by mouth 2 (two) times daily as needed for anxiety.   aspirin EC 81 MG tablet Take 81 mg by mouth daily. Swallow whole.   buPROPion ER (WELLBUTRIN SR) 100 MG 12 hr tablet Take 1 tablet (100 mg total) by mouth daily.   carvedilol (COREG) 3.125 MG tablet Take 1 tablet (3.125 mg total) by mouth 2 (two) times daily.   Cholecalciferol (VITAMIN D3) 1.25 MG (50000 UT) CAPS every 14 (fourteen) days.    cyclobenzaprine (FLEXERIL) 10 MG tablet Take 1 tablet (10 mg total) by mouth at bedtime.   lithium carbonate 150 MG capsule ALTERNATE 2 CAPSULES WITH 1 CAPSULE EVERY OTHER NIGHT   magnesium oxide (MAG-OX) 400 MG tablet Take 400 mg by mouth daily.   nicotine (NICODERM CQ) 14 mg/24hr patch Place 1 patch (14 mg total) onto the skin daily.   PARoxetine (PAXIL) 10 MG tablet Take 1 tablet (10 mg total) by mouth daily.   Pyridoxine HCl (VITAMIN B-6) 500 MG tablet Take 500 mg by mouth as  needed.    SODIUM CHLORIDE PO Take by mouth in the morning and at bedtime.   traZODone (DESYREL) 50 MG tablet Take 1 tablet (50 mg total) by mouth at bedtime.   [DISCONTINUED] lisinopril (PRINIVIL,ZESTRIL) 40 MG tablet Take 1 tablet (40 mg total) by mouth daily.   [DISCONTINUED] rosuvastatin (CRESTOR) 20 MG tablet Take 1 tablet (20 mg total) by mouth daily. Please keep upcoming appt in January 2023 with Dr. Johney Frame before anymore refills. Thank you     Allergies:   Sulfonamide derivatives   Social History   Socioeconomic History   Marital status: Married    Spouse name: Not on file   Number of children: 2   Years of education: Not  on file   Highest education level: Not on file  Occupational History   Not on file  Tobacco Use   Smoking status: Some Days    Packs/day: 0.50    Years: 35.00    Total pack years: 17.50    Types: Cigarettes   Smokeless tobacco: Never  Vaping Use   Vaping Use: Former  Substance and Sexual Activity   Alcohol use: Yes    Alcohol/week: 12.0 - 14.0 standard drinks of alcohol    Types: 12 - 14 Glasses of wine per week   Drug use: No   Sexual activity: Yes    Partners: Male    Birth control/protection: Post-menopausal  Other Topics Concern   Not on file  Social History Narrative   Not on file   Social Determinants of Health   Financial Resource Strain: Not on file  Food Insecurity: Not on file  Transportation Needs: Not on file  Physical Activity: Not on file  Stress: Not on file  Social Connections: Not on file     Family History: The patient's family history includes Breast cancer in her sister; Cancer in her mother; Colon cancer in her mother; Hypertension in her mother; Other in her father. There is no history of Colon polyps, Esophageal cancer, Rectal cancer, or Stomach cancer.  ROS:   Please see the history of present illness.    Review of Systems  Constitutional:  Negative for chills and fever.  HENT:  Negative for nosebleeds.   Eyes:  Negative for blurred vision and redness.  Respiratory:  Positive for shortness of breath.   Cardiovascular:  Negative for chest pain, palpitations, orthopnea, claudication, leg swelling and PND.  Gastrointestinal:  Negative for blood in stool, melena, nausea and vomiting.  Genitourinary:  Negative for frequency and hematuria.  Musculoskeletal:  Negative for falls.  Neurological:  Negative for dizziness and loss of consciousness.  Psychiatric/Behavioral:  Negative for substance abuse.     EKGs/Labs/Other Studies Reviewed:    The following studies were reviewed today:  TTE 08/24/20: IMPRESSIONS   1. Left ventricular ejection  fraction, by estimation, is 60 to 65%. The  left ventricle has normal function. The left ventricle has no regional  wall motion abnormalities. Left ventricular diastolic parameters were  normal.   2. Right ventricular systolic function is normal. The right ventricular  size is normal. There is normal pulmonary artery systolic pressure.   3. The mitral valve is normal in structure. No evidence of mitral valve  regurgitation. No evidence of mitral stenosis.   4. The aortic valve is normal in structure. Aortic valve regurgitation is  not visualized. No aortic stenosis is present.   5. The inferior vena cava is normal in size with greater than 50%  respiratory variability, suggesting  right atrial pressure of 3 mmHg.  CT 07/05/20: CORONARY CALCIUM SCORES:   Left Main: 0   LAD: 274   LCx: 177   RCA: 250   Total Agatston Score: 702   MESA database percentile: 97   AORTA MEASUREMENTS:   Ascending Aorta: 31 mm   Descending Aorta: 23 mm   OTHER FINDINGS:   Heart is normal size. No adenopathy. Visualized lungs clear. No effusions. Imaging into the upper abdomen demonstrates no acute findings. Chest wall soft tissues are unremarkable. No acute bony abnormality.   IMPRESSION: The observed calcium score of 702 is at the percentile 97 for subjects of the same age, gender and race/ethnicity who are free of clinical cardiovascular disease and treated diabetes.   No acute or significant extracardiac abnormality  Recent Labs: 06/21/2021: BUN 30; Creat 0.81; Potassium 4.9; Sodium 135  Recent Lipid Panel    Component Value Date/Time   CHOL 214 (H) 10/09/2021 0812   TRIG 60 10/09/2021 0812   HDL 139 10/09/2021 0812   CHOLHDL 1.5 10/09/2021 0812   CHOLHDL 1.9 07/19/2014 0903   VLDL 8 07/19/2014 0903   LDLCALC 64 10/09/2021 0812     Physical Exam:    VS:  BP 130/82   Pulse (!) 58   Ht '5\' 1"'$  (1.549 m)   Wt 114 lb 3.2 oz (51.8 kg)   LMP 09/24/1999   SpO2 95%   BMI 21.58  kg/m     Wt Readings from Last 3 Encounters:  03/29/22 114 lb 3.2 oz (51.8 kg)  10/05/21 120 lb (54.4 kg)  08/31/21 119 lb 3.2 oz (54.1 kg)     GEN:  Well nourished, well developed in no acute distress HEENT: Normal NECK: No JVD; No carotid bruits CARDIAC: RRR, soft systolic murmur. No rubs or gallops RESPIRATORY:  CTAB, no wheezes ABDOMEN: Soft, non-tender, non-distended MUSCULOSKELETAL:  No edema; No deformity  SKIN: Warm and dry NEUROLOGIC:  Alert and oriented x 3 PSYCHIATRIC:  Normal affect   ASSESSMENT:    1. Coronary artery disease involving native coronary artery of native heart without angina pectoris   2. Pure hypercholesterolemia   3. Primary hypertension   4. Tobacco abuse     PLAN:    In order of problems listed above:  #Multivessel CAD: #Coronary calcification: Patient with coronary calcium score 702 (97% for age, gender, race matched controls). Calcium noted in all three coronary arteries consistent with multivessel CAD. No symptoms and patient is very active without limitations. ECG without evidence of ischemia or infarct. Risk factors include tobacco use, HTN, HLD and age. -TTE with normal BiV function, no WMA -Continue ASA '81mg'$  daily -Continue crestor '20mg'$  daily -Continue coreg 3.'125mg'$  BID -Continue lisinopril '40mg'$  daily -Patient is motivated to quit smoking again   #HTN: Controlled and at goal <120/80s -Continue coreg 3.'125mg'$  BID -Continue lisinopril '40mg'$  daily -Monitor at home with goal BP<120s/80s   #HLD: -Continue crestor '20mg'$  daily -Will repeat lipids at next visit; LDL currently 84 but will trial lifestyle modifications prior to increasing dosing  #Tobacco use: Had successfully quit but started back again with stress in her life. She is motivated to quit again. -Nicotine patches   Medication Adjustments/Labs and Tests Ordered: Current medicines are reviewed at length with the patient today.  Concerns regarding medicines are outlined  above.  Orders Placed This Encounter  Procedures   Lipid panel   Meds ordered this encounter  Medications   rosuvastatin (CRESTOR) 20 MG tablet    Sig: Take  1 tablet (20 mg total) by mouth daily.    Dispense:  90 tablet    Refill:  3   lisinopril (ZESTRIL) 40 MG tablet    Sig: Take 1 tablet (40 mg total) by mouth daily.    Dispense:  90 tablet    Refill:  3    Patient Instructions  Medication Instructions:  The current medical regimen is effective;  continue present plan and medications.  *If you need a refill on your cardiac medications before your next appointment, please call your pharmacy*   Lab Work: Please have fasting blood work in 6 months (Lipid) with your next appointment.  If you have labs (blood work) drawn today and your tests are completely normal, you will receive your results only by: [place] (if you have MyChart) OR A paper copy in the mail If you have any lab test that is abnormal or we need to change your treatment, we will call you to review the results.  Follow-Up: At [facility], you and your health needs are our priority.  As part of our continuing mission to provide you with exceptional heart care, we have created designated Provider Care Teams.  These Care Teams include your primary Cardiologist (physician) and Advanced Practice Providers (APPs -  Physician Assistants and Nurse Practitioners) who all work together to provide you with the care you need, when you need it.  We recommend signing up for the patient portal called "MyChart".  Sign up information is provided on this After Visit Summary.  MyChart is used to connect with patients for Virtual Visits (Telemedicine).  Patients are able to view lab/test results, encounter notes, upcoming appointments, etc.  Non-urgent messages can be sent to your provider as well.   To learn more about what you can do with MyChart, go to [email or web address].    Your next appointment:   6  month(s)  The format for your next appointment:   In Person  Provider:   Dr Gwyndolyn Kaufman  Important Information About Sugar          Signed, Freada Bergeron, MD  03/29/2022 4:28 PM    [place]

## 2022-03-29 ENCOUNTER — Ambulatory Visit: Payer: Medicare HMO | Admitting: Cardiology

## 2022-03-29 ENCOUNTER — Encounter: Payer: Self-pay | Admitting: Cardiology

## 2022-03-29 VITALS — BP 130/82 | HR 58 | Ht 61.0 in | Wt 114.2 lb

## 2022-03-29 DIAGNOSIS — I10 Essential (primary) hypertension: Secondary | ICD-10-CM | POA: Diagnosis not present

## 2022-03-29 DIAGNOSIS — Z720 Tobacco use: Secondary | ICD-10-CM

## 2022-03-29 DIAGNOSIS — E7800 Pure hypercholesterolemia, unspecified: Secondary | ICD-10-CM

## 2022-03-29 DIAGNOSIS — I2510 Atherosclerotic heart disease of native coronary artery without angina pectoris: Secondary | ICD-10-CM

## 2022-03-29 MED ORDER — ROSUVASTATIN CALCIUM 20 MG PO TABS: 20.0000 mg | ORAL_TABLET | Freq: Every day | ORAL | 3 refills | Status: DC

## 2022-03-29 MED ORDER — LISINOPRIL 40 MG PO TABS: 40.0000 mg | ORAL_TABLET | Freq: Every day | ORAL | 3 refills | Status: DC

## 2022-03-29 NOTE — Patient Instructions (Signed)
Medication Instructions:  The current medical regimen is effective;  continue present plan and medications.  *If you need a refill on your cardiac medications before your next appointment, please call your pharmacy*   Lab Work: Please have fasting blood work in 6 months (Lipid) with your next appointment.  If you have labs (blood work) drawn today and your tests are completely normal, you will receive your results only by: [place] (if you have MyChart) OR A paper copy in the mail If you have any lab test that is abnormal or we need to change your treatment, we will call you to review the results.  Follow-Up: At [facility], you and your health needs are our priority.  As part of our continuing mission to provide you with exceptional heart care, we have created designated Provider Care Teams.  These Care Teams include your primary Cardiologist (physician) and Advanced Practice Providers (APPs -  Physician Assistants and Nurse Practitioners) who all work together to provide you with the care you need, when you need it.  We recommend signing up for the patient portal called "MyChart".  Sign up information is provided on this After Visit Summary.  MyChart is used to connect with patients for Virtual Visits (Telemedicine).  Patients are able to view lab/test results, encounter notes, upcoming appointments, etc.  Non-urgent messages can be sent to your provider as well.   To learn more about what you can do with MyChart, go to [email or web address].    Your next appointment:   6 month(s)  The format for your next appointment:   In Person  Provider:   Dr Gwyndolyn Kaufman  Important Information About Sugar

## 2022-04-20 ENCOUNTER — Other Ambulatory Visit: Payer: Self-pay | Admitting: Psychiatry

## 2022-04-20 DIAGNOSIS — F39 Unspecified mood [affective] disorder: Secondary | ICD-10-CM

## 2022-05-08 DIAGNOSIS — Z1231 Encounter for screening mammogram for malignant neoplasm of breast: Secondary | ICD-10-CM | POA: Diagnosis not present

## 2022-05-23 ENCOUNTER — Encounter (HOSPITAL_BASED_OUTPATIENT_CLINIC_OR_DEPARTMENT_OTHER): Payer: Self-pay | Admitting: *Deleted

## 2022-07-07 ENCOUNTER — Other Ambulatory Visit: Payer: Self-pay | Admitting: Psychiatry

## 2022-07-07 DIAGNOSIS — F39 Unspecified mood [affective] disorder: Secondary | ICD-10-CM

## 2022-07-07 DIAGNOSIS — F5105 Insomnia due to other mental disorder: Secondary | ICD-10-CM

## 2022-07-07 DIAGNOSIS — F411 Generalized anxiety disorder: Secondary | ICD-10-CM

## 2022-07-08 ENCOUNTER — Other Ambulatory Visit: Payer: Self-pay | Admitting: Psychiatry

## 2022-07-08 DIAGNOSIS — F331 Major depressive disorder, recurrent, moderate: Secondary | ICD-10-CM

## 2022-07-16 ENCOUNTER — Encounter: Payer: Self-pay | Admitting: Psychiatry

## 2022-07-16 ENCOUNTER — Ambulatory Visit (INDEPENDENT_AMBULATORY_CARE_PROVIDER_SITE_OTHER): Payer: Medicare HMO | Admitting: Psychiatry

## 2022-07-16 DIAGNOSIS — F39 Unspecified mood [affective] disorder: Secondary | ICD-10-CM | POA: Diagnosis not present

## 2022-07-16 DIAGNOSIS — F5105 Insomnia due to other mental disorder: Secondary | ICD-10-CM

## 2022-07-16 DIAGNOSIS — E871 Hypo-osmolality and hyponatremia: Secondary | ICD-10-CM | POA: Diagnosis not present

## 2022-07-16 DIAGNOSIS — Z79899 Other long term (current) drug therapy: Secondary | ICD-10-CM

## 2022-07-16 DIAGNOSIS — F331 Major depressive disorder, recurrent, moderate: Secondary | ICD-10-CM | POA: Diagnosis not present

## 2022-07-16 DIAGNOSIS — H5203 Hypermetropia, bilateral: Secondary | ICD-10-CM | POA: Diagnosis not present

## 2022-07-16 DIAGNOSIS — H524 Presbyopia: Secondary | ICD-10-CM | POA: Diagnosis not present

## 2022-07-16 DIAGNOSIS — H52223 Regular astigmatism, bilateral: Secondary | ICD-10-CM | POA: Diagnosis not present

## 2022-07-16 DIAGNOSIS — F411 Generalized anxiety disorder: Secondary | ICD-10-CM | POA: Diagnosis not present

## 2022-07-16 MED ORDER — LITHIUM CARBONATE 150 MG PO CAPS: ORAL_CAPSULE | ORAL | 0 refills | Status: DC

## 2022-07-16 MED ORDER — ALPRAZOLAM 0.5 MG PO TABS: 0.5000 mg | ORAL_TABLET | Freq: Two times a day (BID) | ORAL | 1 refills | Status: DC | PRN

## 2022-07-16 MED ORDER — PAROXETINE HCL 10 MG PO TABS: 10.0000 mg | ORAL_TABLET | Freq: Every day | ORAL | 1 refills | Status: DC

## 2022-07-16 MED ORDER — BUPROPION HCL ER (SR) 100 MG PO TB12: 100.0000 mg | ORAL_TABLET | Freq: Every day | ORAL | 1 refills | Status: DC

## 2022-07-16 NOTE — Progress Notes (Signed)
Robin Barton [id number] Jan 29, 1956 66 y.o.    Subjective:   Patient ID:  Robin Barton is a 66 y.o. (DOB 04/02/1956) female.  Chief Complaint:  Chief Complaint  Patient presents with   Follow-up   Depression   Anxiety    Depression        Associated symptoms include myalgias.  Associated symptoms include no decreased concentration, no fatigue and no suicidal ideas.  Past medical history includes anxiety.   Anxiety Patient reports no confusion, decreased concentration, dizziness, nervous/anxious behavior or suicidal ideas.     Robin Barton presents to the office today for follow-up mood and anxiety.  seen October 2020.  No meds were changed.  She was doing well psychiatrically.  It was recommended she get a lithium level and BMP and Continue paroxetine 20 mg daily, continue trazodone 50 mg nightly, continue lithium 150 mg alternating with 300 mg every other day.  January 17, 2020 appointment the following is noted: .Ok.  Exposed to virus twice and had to QT.  Had to hlep sister with med probvlems.   Forgot the labs.   Gotten vaccinated. Not taking Xanax.   Sleeps [place] with trazodone. Forgot to get labs after reducing the lithium.  Tremor is much better and manageable with the reduction of lithium.  No worsening of mood with the change. Tremor has gotten better on it's own.  No longer needs B6. Plan: Continue paroxetine 20 mg daily, continue trazodone 50 mg nightly, continue lithium 150 mg alternating with 300 mg every other day. Asks for alprazolam prn anxiety.  August 2021 it was discovered she had low sodium and was just recommended she discuss with PCP.  She stopped trazodone and her sodium level improved.  06/26/20 appt noted: Reduced paroxetine to 10 mg from 20 mg daily and stopped trazodone. Low sodium SE caused scary neuro sx.  Had gotten confused and was having tearful episodes.  Sx had developed gradually over time.   Balance issues.   I feel so  much better.    Taking salt tablet twice daily. BP is fine.   Emotionally feels good.  On a cleaning rampage at this time.    Lost a good friend to ALS.   Taking Xanax instead of trazodone for sleep. Plan: No med changes today. Continue lower paroxetine 10 mg daily,  continue lithium 150 mg alternating with 300 mg every other day.  Continue salt tablets Stopped trazodone DT hyponatremia. Asks for alprazolam prn anxiety or insomnia since off the Xanax..  09/26/2020 appointment with following noted: Xanax 0.5 mg HS. Still on salt, paroxetine 10, lithium 300/150 QOD. Asks to take Xanax during the day for anxiety.  Don't seem to have the drive she used to have but once done she feels great.  Some avoidance like social things.  Energy is good. Possibly more anxious at lower dose paroxetine. No crying spells.  H notices reduction in motivation and she doesn't seem like herself. Smokes and needs to stop. Plan: Reduce caffeine bc she gets anxious after taking it and has not reduced as requested. wellbutrin XL 150 mg 1 for 1 week in the AM, then 2 each morning. Repeat serum sodium  11/13/2020 appointment with following noted: Wellbutrin helped me settle down and reduced craving to smoke.  Never got above 150 mg XL.   SE night sweats. Still continues paroxetine 10. Not sleeping as well either with awakening with sweats.  Cut smoking way back to as little as 3-5 per  day with less craving from 1/2 to 1 PPD. Emotionally felt better with better task completion.  Better motivation.  Don't feel as anxious in AM and less coffee in the AM.  Plan: Switch to Wellbutrin SR 100 mg AM to reduce SE.  It's helping.  02/06/2021 appointment with the following noted: Not sleeping well with night sweats awakens..Still gets at least 8 hours but prefers 10 h.  Not drowsy. Wonders if related to Wellbutrin.  Feels she needs 1 mg alprazolam to get best response from it.  No hangover.  Felt sleep better with trazodone but  afraid of repeat hyponatremia.   Some depression.  Feels a little lost and not real motivated but not everyday.  Thinks needs job and schedule.  Good enjoyment but less motivation. Kids don't live here and [name] gone a lot on golf trips. Antsy in the morning after up 10-15 mins and reduced caffeine and it helped. Not anxious all day.   Wellbutrin reduced smoking by 50%.  Don't want to give Wellbutrin up.  No daytime XaNAX.   Plan: Switch to Wellbutrin SR 100 mg AM to reduce SE.  It's helping. Disc smoking cessation.. Asks for alprazolam prn anxiety or insomnia since off the trazodone. Ok increase to 0.75 mg HS Continue lower paroxetine 10 mg daily,  continue lithium 150 mg alternating with 300 mg every other day.   06/05/21 appt noted:   Fair.  Not sleeping well.  EMA and jus t lays there in the dark.  Then feels like crap the next day.  Happens 5 days per week.  Took trazdone other night without Xanax without more help. Had slept well with trazodone. Night sweats after she awakens.  Tried black [place] intermittently.   Redeuced coffee. Smoking better but hasn't quit.  3-5 per day.   .Still having tremor Some occ dep but not usually.  .  Some anxiety over doing things..    Still feels anxious in AM same pattern..  Drinks coffee.  Never had ADD but anxious about doing things she wants to do but doesn't get done..   Denies appetite disturbance.  Patient reports that energy and motivation have been good.  Patient denies any difficulty with concentration.  Patient denies any suicidal ideation. Plan: Switch to Wellbutrin IR 75 mg AM to reduce insomnia SE.  It's helping. Disc smoking cessation..  10/16/2021 appt noted: [place] with goldfish. Still not sleeping very well. Klonopin 0.75 wihtout help. Switched back to Xanax 0.75 mg HS but not as good as trazodone. Sleep adequate hours. But awaken in middle of night thinking about things. Back pain unusually and muscular.  RX Robaxin but didn't take  yet.  Has cyclobenzaprine. Sleep doesn't feel restuful. Mood OK not great.  Should exercise. Some anxiety in the AM Plan: Try cyclobenzaprine for sleep and back pain.  She has some Increase Wellbutrin to 100 mg AM  for mood  01/09/22 appt noted: Flexeril didn't work for EMA with worry. Last week restarted trazodone 50 mg HS and it is working. Taking paroxetine 10 and Wellbutrin SR 100 AM.  Tolerated fine.   Energy pretty good. Low sodium 130.  Increased salt tablets to 2 daily.   Some anxiety in the AM. Mood up and down with down lasting 24 hours and seems related to insomnia or not. Trazodone works better for sleep than Xanax.    03/12/2022 appointment with the following noted: Continues Wellbutrin SR 100 mg every morning, cyclobenzaprine 10 mg nightly only  rarely, lithium 300 alternating with 150 every other night, paroxetine 10 mg, trazodone 50 mg nightly Pretty good usually.   PE early June sodium up to 138.  Stopped sodium after that appt.  BC gets stuck in throat. Don't drink enough water.   Reports high liver enzyme apparently minor bc PCP didn't order more testing. Patient reports stable mood and denies depressed or irritable moods.  Patient denies any recent difficulty with anxiety.  Patient denies difficulty with sleep initiation or maintenance. Denies appetite disturbance.  Patient reports that energy and motivation have been good.  Patient denies any difficulty with concentration.  Patient denies any suicidal ideation. Plan: No med changes Continues Wellbutrin SR 100 mg every morning, cyclobenzaprine 10 mg nightly only rarely, lithium 300 alternating with 150 every other night, paroxetine 10 mg, trazodone 50 mg nightly Check CMP  07/16/22 appt noted: Lost paperwork for CMP. Can't swallow B6 or salt tablets.  Tried cutting both in half.  They get hung in throat. Otherwise doing great.  Patient reports stable mood and denies depressed or irritable moods.  Patient denies any  recent difficulty with anxiety.  Patient denies difficulty with sleep initiation but EMA about 4 AM often.  Denies appetite disturbance.  Patient reports that energy and motivation have been good.  Patient denies any difficulty with concentration.  Patient denies any suicidal ideation. Sleep better with Xanax than trazodone. Not using alprazolam for anxiety.  Past Psychiatric Medication Trials: paroxetine, several intolerant SSRI's Wellbutrin SR 100 ? ins lithium Xanax,  trazodone low sodium  History of Neuro eval for tremor.  Review of Systems:  Review of Systems  Constitutional:  Negative for fatigue.  Musculoskeletal:  Positive for back pain and myalgias.  Neurological:  Negative for dizziness and tremors.  Psychiatric/Behavioral:  Negative for agitation, behavioral problems, confusion, decreased concentration, dysphoric mood, hallucinations, self-injury, sleep disturbance and suicidal ideas. The patient is not nervous/anxious and is not hyperactive.     Medications: I have reviewed the patient's current medications.  Current Outpatient Medications  Medication Sig Dispense Refill   aspirin EC 81 MG tablet Take 81 mg by mouth daily. Swallow whole.     carvedilol (COREG) 3.125 MG tablet Take 1 tablet (3.125 mg total) by mouth 2 (two) times daily. 180 tablet 3   Cholecalciferol (VITAMIN D3) 1.25 MG (50000 UT) CAPS every 14 (fourteen) days.      cyclobenzaprine (FLEXERIL) 10 MG tablet Take 1 tablet (10 mg total) by mouth at bedtime. 90 tablet 0   lisinopril (ZESTRIL) 40 MG tablet Take 1 tablet (40 mg total) by mouth daily. 90 tablet 3   magnesium oxide (MAG-OX) 400 MG tablet Take 400 mg by mouth daily.     nicotine (NICODERM CQ) 14 mg/24hr patch Place 1 patch (14 mg total) onto the skin daily. 30 patch 3   Pyridoxine HCl (VITAMIN B-6) 500 MG tablet Take 500 mg by mouth as needed.      rosuvastatin (CRESTOR) 20 MG tablet Take 1 tablet (20 mg total) by mouth daily. 90 tablet 3   SODIUM  CHLORIDE PO Take by mouth in the morning and at bedtime.     ALPRAZolam (XANAX) 0.5 MG tablet Take 1 tablet (0.5 mg total) by mouth 2 (two) times daily as needed for anxiety. 45 tablet 1   buPROPion ER (WELLBUTRIN SR) 100 MG 12 hr tablet Take 1 tablet (100 mg total) by mouth daily. 90 tablet 1   lithium carbonate 150 MG capsule ALTERNATE TAKING 2 CAPSULES  WITH 1 CAPSULE BY MOUTH EVERY OTHER NIGHT 135 capsule 0   PARoxetine (PAXIL) 10 MG tablet Take 1 tablet (10 mg total) by mouth daily. 90 tablet 1   No current facility-administered medications for this visit.    Medication Side Effects: Other: tremor recently better  Allergies:  Allergies  Allergen Reactions   Sulfonamide Derivatives Hives, Itching and Rash    Past Medical History:  Diagnosis Date   Acid reflux    Anxiety    Atypical lobular hyperplasia of left breast 11/25/13   lumpectomy, 12/10/13   BCC (basal cell carcinoma)    per pt, never had cancer   Depression    Hyperlipidemia    Hypertension    Osteopenia    Thyroid disease 2010   rt thyroid nodule    Family History  Problem Relation Age of Onset   Cancer Mother        colon-stage 4   Hypertension Mother    Colon cancer Mother    Other Father    Breast cancer Sister    Colon polyps Neg Hx    Esophageal cancer Neg Hx    Rectal cancer Neg Hx    Stomach cancer Neg Hx     Social History   Socioeconomic History   Marital status: Married    Spouse name: Not on file   Number of children: 2   Years of education: Not on file   Highest education level: Not on file  Occupational History   Not on file  Tobacco Use   Smoking status: Some Days    Packs/day: 0.50    Years: 35.00    Total pack years: 17.50    Types: Cigarettes   Smokeless tobacco: Never  Vaping Use   Vaping Use: Former  Substance and Sexual Activity   Alcohol use: Yes    Alcohol/week: 12.0 - 14.0 standard drinks of alcohol    Types: 12 - 14 Glasses of wine per week   Drug use: No    Sexual activity: Yes    Partners: Male    Birth control/protection: Post-menopausal  Other Topics Concern   Not on file  Social History Narrative   Not on file   Social Determinants of Health   Financial Resource Strain: Not on file  Food Insecurity: Not on file  Transportation Needs: Not on file  Physical Activity: Not on file  Stress: Not on file  Social Connections: Not on file  Intimate Partner Violence: Not on file    Past Medical History, Surgical history, Social history, and Family history were reviewed and updated as appropriate.   Please see review of systems for further details on the patient's review from today.   Objective:   Physical Exam:  LMP 09/24/1999   Physical Exam Constitutional:      General: She is not in acute distress.    Appearance: She is well-developed.  Musculoskeletal:        General: No deformity.  Neurological:     Mental Status: She is alert and oriented to person, place, and time.     Cranial Nerves: No dysarthria.     Coordination: Coordination normal.  Psychiatric:        Attention and Perception: Attention and perception normal. She does not perceive auditory or visual hallucinations.        Mood and Affect: Mood is anxious. Mood is not depressed. Affect is not labile, blunt, angry or inappropriate.        Speech: Speech  normal. Speech is not rapid and pressured or slurred.        Behavior: Behavior normal. Behavior is cooperative.        Thought Content: Thought content normal. Thought content is not paranoid or delusional. Thought content does not include homicidal or suicidal ideation. Thought content does not include suicidal plan.        Cognition and Memory: Cognition and memory normal.        Judgment: Judgment normal.     Comments: Insight good No evidence for cognitive issues. Minimal anxiety     Lab Review:     Component Value Date/Time   NA 135 06/21/2021 1145   NA 139 07/19/2014 0903   K 4.9 06/21/2021 1145   K  4.7 07/19/2014 0903   CL 104 06/21/2021 1145   CO2 22 06/21/2021 1145   CO2 24 07/19/2014 0903   GLUCOSE 76 06/21/2021 1145   GLUCOSE 95 07/19/2014 0903   BUN 30 (H) 06/21/2021 1145   BUN 22.8 07/19/2014 0903   CREATININE 0.81 06/21/2021 1145   CREATININE 0.8 07/19/2014 0903   CALCIUM 10.0 06/21/2021 1145   CALCIUM 9.6 07/19/2014 0903   PROT 7.3 07/19/2014 0903   ALBUMIN 4.1 07/19/2014 0903   AST 23 07/19/2014 0903   ALT 21 07/19/2014 0903   ALKPHOS 54 07/19/2014 0903   BILITOT 0.49 07/19/2014 0903   GFRNONAA 53 (L) 06/18/2018 0934   GFRAA 61 06/18/2018 0934       Component Value Date/Time   WBC 5.0 07/19/2014 0903   WBC 5.3 04/05/2011 0923   RBC 3.94 07/19/2014 0903   RBC 4.05 04/05/2011 0923   HGB 12.5 07/19/2014 0903   HCT 37.8 07/19/2014 0903   PLT 370 07/19/2014 0903   MCV 96.0 07/19/2014 0903   MCH 31.8 07/19/2014 0903   MCH 31.9 04/05/2011 0923   MCHC 33.1 07/19/2014 0903   MCHC 33.2 04/05/2011 0923   RDW 12.8 07/19/2014 0903   LYMPHSABS 1.5 07/19/2014 0903   MONOABS 0.6 07/19/2014 0903   EOSABS 0.1 07/19/2014 0903   BASOSABS 0.0 07/19/2014 0903    Lithium Lvl  Date Value Ref Range Status  06/21/2021 0.6 0.6 - 1.2 mmol/L Final     No results found for: "PHENYTOIN", "PHENOBARB", "VALPROATE", "CBMZ"   .res Assessment: Plan:    Major depressive disorder, recurrent episode, moderate (HCC) - Plan: Basic metabolic panel, buPROPion ER (WELLBUTRIN SR) 100 MG 12 hr tablet, Lithium level  Generalized anxiety disorder - Plan: ALPRAZolam (XANAX) 0.5 MG tablet, PARoxetine (PAXIL) 10 MG tablet  Insomnia due to mental condition - Plan: ALPRAZolam (XANAX) 0.5 MG tablet  Hyponatremia - Plan: Basic metabolic panel, Lithium level  Lithium use - Plan: Basic metabolic panel, Lithium level  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  Overall she remained stable after the reduction in both lithium and paroxetine to current dosages.  Better motivation and task completion and less msoking with Wellbutrin. To help mood will focus on sleep problems.  We discussed her history of response to lithium at the lower dose 150 and then the 300 mg a day.  We discussed her side effects of tremor.  It's better now.   The tremor is better with the reduction to the current dose of lithium and the use of B6.  It is now manageable.  Disc option stopping B6 to see  if she still needs it. Last lithium level is low normal.  We discussed the question of her diagnosis given at prior history of 2 weeks of hypomania but that may have been related to medications.  The uncertainty whether she truly has major depression versus bipolar disorder remains.  We discussed the risk of antidepressants causing mood cycling and the advantage to being on a lower dose.  Repeat Sodium 3/23 130 off trazodone Sodium 02/2022 138 on 2 gm daily, then she stopped it. Risk going lower off the salt tablets.  disc risk low sodium. Rec repeat level If remains low then switch paroxetine to Viibryd.  Answered questions about each med and DDI and risks to liver and kidney  Continue lower paroxetine 10 mg daily,   continue lithium 150 mg alternating with 300 mg every other day.  Counseled patient regarding potential benefits, risks, and side effects of lithium to include potential risk of lithium affecting thyroid and renal function.  Discussed need for periodic lab monitoring to determine drug level and to assess for potential adverse effects.  Counseled patient regarding signs and symptoms of lithium toxicity and advised that they notify office immediately or seek urgent medical attention if experiencing these signs and symptoms.  Patient advised to contact office with any questions or concerns. B6 helped lithium tremor  Trial black cohush.  Continue trazodone  50 mg HS if tolerated.  Last sodium level 130 not on trazodone. Disc off label use of meds.  continue Wellbutrin to 100 mg AM  for mood.  Consider increase if needed. We discussed the short-term risks associated with benzodiazepines including sedation and increased fall risk among others.  Discussed long-term side effect risk including dependence, potential withdrawal symptoms, and the potential eventual dose-related risk of dementia.  But recent studies from 2020 dispute this association between benzodiazepines and dementia risk. Newer studies in 2020 do not support an association with dementia. Disc SE worsen with edge.  Disc smoking cessation.. Watch caffeine for tremor.  She has done this.  No med changes except sleeper: Continues Wellbutrin SR 100 mg every morning, cyclobenzaprine 10 mg nightly only rarely, lithium 300 alternating with 150 every other night, paroxetine 10 mg,   DT low sodium try stopping trazodone and use alprazolam bc it seemed to help better recently with EMA Disc that various meds could cause low sodium  Check CMP and lithium level.  consider Abilify potentiation  Follow-up 3-4 mos  Lynder Parents MD, DFAPA\  No future appointments.   Orders Placed This Encounter  Procedures   Basic metabolic panel   Lithium level      -------------------------------

## 2022-07-17 ENCOUNTER — Telehealth: Payer: Self-pay | Admitting: Gastroenterology

## 2022-07-17 NOTE — Telephone Encounter (Signed)
Left patient a detailed vm with Dr. Doyne Keel recommendations as outlined below. Pt advised to schedule an office visit if she had any further questions or concerns.

## 2022-07-17 NOTE — Telephone Encounter (Signed)
I can clarify. Patient had 3 small polyps removed in 2017, her last exam was in 2020 with 2 diminutive polyps. Based on national surveillance guidelines, given results of her last exam she would not be due for another exam for 7 years. Her mother's history given she had CRC dx age 68 actually should not influence how frequently we do this at her age (if her mother was dx age 54 or less we would do it in 5 years). If she is not comfortable with this plan, we could do it in 5 years from her last exam although unclear if insurance would cover the exam at that time. She does not need a colonoscopy 3 years from her last exam, that is far too early. If she has questions about this or wants to talk with me further about it I'd be happy to see her in the office. Thanks

## 2022-07-17 NOTE — Telephone Encounter (Signed)
Inbound call from patient stating that she had a colonoscopy with Dr. Havery Moros in 2020 and got a letter in the mail that she would not be due until 2027. Patient stated that she is concerned because her mother found out she had colon cancer when she was 69 and patient believes she needs to be on a 3 year recall. Patient is requesting a call back to discuss. Please advise.

## 2022-07-26 DIAGNOSIS — E871 Hypo-osmolality and hyponatremia: Secondary | ICD-10-CM | POA: Diagnosis not present

## 2022-07-26 DIAGNOSIS — Z79899 Other long term (current) drug therapy: Secondary | ICD-10-CM | POA: Diagnosis not present

## 2022-07-26 DIAGNOSIS — F331 Major depressive disorder, recurrent, moderate: Secondary | ICD-10-CM | POA: Diagnosis not present

## 2022-07-27 LAB — LITHIUM LEVEL: Lithium Lvl: 0.6 mmol/L (ref 0.6–1.2)

## 2022-07-27 LAB — BASIC METABOLIC PANEL
BUN/Creatinine Ratio: 26 (calc) — ABNORMAL HIGH (ref 6–22)
BUN: 27 mg/dL — ABNORMAL HIGH (ref 7–25)
CO2: 23 mmol/L (ref 20–32)
Calcium: 9.4 mg/dL (ref 8.6–10.4)
Chloride: 104 mmol/L (ref 98–110)
Creat: 1.02 mg/dL (ref 0.50–1.05)
Glucose, Bld: 85 mg/dL (ref 65–99)
Potassium: 4.6 mmol/L (ref 3.5–5.3)
Sodium: 136 mmol/L (ref 135–146)

## 2022-08-02 DIAGNOSIS — E871 Hypo-osmolality and hyponatremia: Secondary | ICD-10-CM | POA: Diagnosis not present

## 2022-08-02 DIAGNOSIS — F331 Major depressive disorder, recurrent, moderate: Secondary | ICD-10-CM | POA: Diagnosis not present

## 2022-08-02 DIAGNOSIS — Z79899 Other long term (current) drug therapy: Secondary | ICD-10-CM | POA: Diagnosis not present

## 2022-08-02 DIAGNOSIS — R748 Abnormal levels of other serum enzymes: Secondary | ICD-10-CM | POA: Diagnosis not present

## 2022-08-03 LAB — COMPREHENSIVE METABOLIC PANEL
AG Ratio: 1.8 (calc) (ref 1.0–2.5)
ALT: 22 U/L (ref 6–29)
AST: 29 U/L (ref 10–35)
Albumin: 4.5 g/dL (ref 3.6–5.1)
Alkaline phosphatase (APISO): 63 U/L (ref 37–153)
BUN/Creatinine Ratio: 28 (calc) — ABNORMAL HIGH (ref 6–22)
BUN: 31 mg/dL — ABNORMAL HIGH (ref 7–25)
CO2: 24 mmol/L (ref 20–32)
Calcium: 9.6 mg/dL (ref 8.6–10.4)
Chloride: 99 mmol/L (ref 98–110)
Creat: 1.11 mg/dL — ABNORMAL HIGH (ref 0.50–1.05)
Globulin: 2.5 g/dL (calc) (ref 1.9–3.7)
Glucose, Bld: 79 mg/dL (ref 65–99)
Potassium: 5.1 mmol/L (ref 3.5–5.3)
Sodium: 132 mmol/L — ABNORMAL LOW (ref 135–146)
Total Bilirubin: 0.5 mg/dL (ref 0.2–1.2)
Total Protein: 7 g/dL (ref 6.1–8.1)

## 2022-08-03 LAB — LITHIUM LEVEL: Lithium Lvl: 0.5 mmol/L — ABNORMAL LOW (ref 0.6–1.2)

## 2022-09-26 ENCOUNTER — Other Ambulatory Visit (INDEPENDENT_AMBULATORY_CARE_PROVIDER_SITE_OTHER): Payer: Medicare HMO

## 2022-09-26 ENCOUNTER — Encounter: Payer: Self-pay | Admitting: Gastroenterology

## 2022-09-26 ENCOUNTER — Ambulatory Visit: Payer: Medicare HMO | Admitting: Gastroenterology

## 2022-09-26 ENCOUNTER — Telehealth: Payer: Self-pay

## 2022-09-26 ENCOUNTER — Other Ambulatory Visit: Payer: Self-pay

## 2022-09-26 VITALS — BP 130/74 | HR 69 | Ht 62.0 in | Wt 116.2 lb

## 2022-09-26 DIAGNOSIS — Z800 Family history of malignant neoplasm of digestive organs: Secondary | ICD-10-CM

## 2022-09-26 DIAGNOSIS — Z86010 Personal history of colonic polyps: Secondary | ICD-10-CM

## 2022-09-26 DIAGNOSIS — R194 Change in bowel habit: Secondary | ICD-10-CM

## 2022-09-26 LAB — CBC WITH DIFFERENTIAL/PLATELET
Basophils Absolute: 0.1 10*3/uL (ref 0.0–0.1)
Basophils Relative: 1.9 % (ref 0.0–3.0)
Eosinophils Absolute: 0.1 10*3/uL (ref 0.0–0.7)
Eosinophils Relative: 1.4 % (ref 0.0–5.0)
HCT: 36.8 % (ref 36.0–46.0)
Hemoglobin: 12.4 g/dL (ref 12.0–15.0)
Lymphocytes Relative: 22.4 % (ref 12.0–46.0)
Lymphs Abs: 1.4 10*3/uL (ref 0.7–4.0)
MCHC: 33.7 g/dL (ref 30.0–36.0)
MCV: 102 fl — ABNORMAL HIGH (ref 78.0–100.0)
Monocytes Absolute: 0.7 10*3/uL (ref 0.1–1.0)
Monocytes Relative: 11.2 % (ref 3.0–12.0)
Neutro Abs: 4.1 10*3/uL (ref 1.4–7.7)
Neutrophils Relative %: 63.1 % (ref 43.0–77.0)
Platelets: 424 10*3/uL — ABNORMAL HIGH (ref 150.0–400.0)
RBC: 3.61 Mil/uL — ABNORMAL LOW (ref 3.87–5.11)
RDW: 13 % (ref 11.5–15.5)
WBC: 6.4 10*3/uL (ref 4.0–10.5)

## 2022-09-26 LAB — TSH: TSH: 1.49 u[IU]/mL (ref 0.35–5.50)

## 2022-09-26 MED ORDER — DICYCLOMINE HCL 10 MG PO CAPS: 10.0000 mg | ORAL_CAPSULE | Freq: Three times a day (TID) | ORAL | 1 refills | Status: DC | PRN

## 2022-09-26 MED ORDER — LOPERAMIDE HCL 2 MG PO TABS: 2.0000 mg | ORAL_TABLET | ORAL | 0 refills | Status: AC | PRN

## 2022-09-26 NOTE — Patient Instructions (Addendum)
If you are age 69 or older, your body mass index should be between 23-30. Your Body mass index is 21.26 kg/m. If this is out of the aforementioned range listed, please consider follow up with your Primary Care Provider.  If you are age 100 or younger, your body mass index should be between 19-25. Your Body mass index is 21.26 kg/m. If this is out of the aformentioned range listed, please consider follow up with your Primary Care Provider.   ________________________________________________________   Please go to the lab in the basement of our building to have lab work done as you leave today. Hit "B" for basement when you get on the elevator.  When the doors open the lab is on your left.  We will call you with the results. Thank you.  We are giving you a Low-FODMAP diet handout today. FODMAPs are short-chain carbohydrates (sugars) that are highly fermentable, which means that they go through chemical changes in the GI system, and are poorly absorbed during digestion. When FODMAPs reach the colon (large intestine), bacteria ferment these sugars, turning them into gas and chemicals. This stretches the walls of the colon, causing abdominal bloating, distension, cramping, pain, and/or changes in bowel habits in many patients with IBS. FODMAPs are not unhealthy or harmful, but may exacerbate GI symptoms in those with sensitive GI tracts.  We have sent the following medications to your pharmacy for you to pick up at your convenience: Bentyl 10 mg: Take every 8 hours as needed  Use Imodium, over-the-counter, as needed  You will be due for a recall colonoscopy in 02-2026. We will send you a reminder in the mail when it gets closer to that time.  Thank you for entrusting me with your care and for choosing [facility], Dr. [place] Cellar

## 2022-09-26 NOTE — Progress Notes (Signed)
HPI :  67 year old female here for a follow-up visit for history of colon polyps, family history of colon cancer, altered bowel habits.  She states for the past few months she has had intermittent loose stools that are associated with abdominal cramps.  She states it can last for perhaps a week at a time and then resolve without any clear triggers.  Her baseline bowel habits are 1 formed bowel movement daily.  She can have periods of time where she will have a few loose stools per day.  She has a hard time finding clear trigger foods although does endorse that every time she eats beef or pork she will feel poorly with loose stools and abdominal pain.  She denies any known tick bite history.  No new medication she has been on since this started.  Magnesium oxide is listed on her medication list but she states she does not take it.  She does not use NSAIDs on a routine basis.  She denies any blood in her stools.  She denies any sick contacts or recent antibiotic use.  She been trying to eat a high-fiber diet, does not take any fiber supplementation or has not tried Imodium or other antidiarrheals.  Recall her mother had colon cancer at age 104 and died from it, stage IV disease.  No other family history of colon cancer.  She had a colonoscopy with me in 2017 with 3 small polyps removed, that led to a follow-up exam with me in 2020 at which time she had 2 diminutive adenomas removed.   Recall she is on chronic lithium.   Colonoscopy 03/15/2019: - One diminutive polyp in the transverse colon, removed with a cold snare. Resected and retrieved. - One diminutive polyp at the hepatic flexure, removed with a cold snare. Resected and retrieved. - Tortuous colon. - The examination was otherwise normal. - TUBULAR ADENOMA (1 OF 2 FRAGMENTS) - BENIGN COLONIC MUCOSA (1 OF 2 FRAGMENTS) - NO HIGH GRADE DYSPLASIA OR MALIGNANCY IDENTIFIED  Past Medical History:  Diagnosis Date   Acid reflux    Anxiety     Atypical lobular hyperplasia of left breast 11/25/13   lumpectomy, 12/10/13   BCC (basal cell carcinoma)    per pt, never had cancer   Depression    Hyperlipidemia    Hypertension    Osteopenia    Thyroid disease 2010   rt thyroid nodule     Past Surgical History:  Procedure Laterality Date   BREAST LUMPECTOMY WITH RADIOACTIVE SEED LOCALIZATION Left 12/10/2013   Procedure: BREAST LUMPECTOMY WITH RADIOACTIVE SEED LOCALIZATION;  Surgeon: [name], MD;  Location: [facility];  Service: General;  Laterality: Left;   BUNIONECTOMY  2/13 left foot and toe straightened   BUNIONECTOMY  3/13 right foot and toe straightened   COLONOSCOPY     NASAL SEPTUM SURGERY  1982   Family History  Problem Relation Age of Onset   Cancer Mother        colon-stage 4   Hypertension Mother    Colon cancer Mother    Other Father    Breast cancer Sister    Colon polyps Neg Hx    Esophageal cancer Neg Hx    Rectal cancer Neg Hx    Stomach cancer Neg Hx    Social History   Tobacco Use   Smoking status: Some Days    Packs/day: 0.50    Years: 35.00    Total pack years: 17.50  Types: Cigarettes   Smokeless tobacco: Never  Vaping Use   Vaping Use: Former  Substance Use Topics   Alcohol use: Yes    Alcohol/week: 12.0 - 14.0 standard drinks of alcohol    Types: 12 - 14 Glasses of wine per week   Drug use: No   Current Outpatient Medications  Medication Sig Dispense Refill   ALPRAZolam (XANAX) 0.5 MG tablet Take 1 tablet (0.5 mg total) by mouth 2 (two) times daily as needed for anxiety. 45 tablet 1   aspirin EC 81 MG tablet Take 81 mg by mouth daily. Swallow whole.     buPROPion ER (WELLBUTRIN SR) 100 MG 12 hr tablet Take 1 tablet (100 mg total) by mouth daily. 90 tablet 1   carvedilol (COREG) 3.125 MG tablet Take 1 tablet (3.125 mg total) by mouth 2 (two) times daily. 180 tablet 3   Cholecalciferol (VITAMIN D3) 1.25 MG (50000 UT) CAPS every 14 (fourteen) days.       cyclobenzaprine (FLEXERIL) 10 MG tablet Take 1 tablet (10 mg total) by mouth at bedtime. 90 tablet 0   lisinopril (ZESTRIL) 40 MG tablet Take 1 tablet (40 mg total) by mouth daily. 90 tablet 3   lithium carbonate 150 MG capsule ALTERNATE TAKING 2 CAPSULES WITH 1 CAPSULE BY MOUTH EVERY OTHER NIGHT 135 capsule 0   magnesium oxide (MAG-OX) 400 MG tablet Take 400 mg by mouth daily.     nicotine (NICODERM CQ) 14 mg/24hr patch Place 1 patch (14 mg total) onto the skin daily. 30 patch 3   PARoxetine (PAXIL) 10 MG tablet Take 1 tablet (10 mg total) by mouth daily. 90 tablet 1   Pyridoxine HCl (VITAMIN B-6) 500 MG tablet Take 500 mg by mouth as needed.      rosuvastatin (CRESTOR) 20 MG tablet Take 1 tablet (20 mg total) by mouth daily. 90 tablet 3   SODIUM CHLORIDE PO Take by mouth in the morning and at bedtime.     traZODone (DESYREL) 50 MG tablet Take 50 mg by mouth at bedtime. Pt doesn't remember mg,     No current facility-administered medications for this visit.   Allergies  Allergen Reactions   Sulfonamide Derivatives Hives, Itching and Rash     Review of Systems: All systems reviewed and negative except where noted in HPI.   Labs reviewed in Epic - no recent CBC or TSH  Physical Exam: BP 130/74   Pulse 69   Ht '5\' 2"'$  (1.575 m)   Wt 116 lb 4 oz (52.7 kg)   LMP 09/24/1999   BMI 21.26 kg/m  Constitutional: Pleasant,well-developed, female in no acute distress. Neurological: Alert and oriented to person place and time. Psychiatric: Normal mood and affect. Behavior is normal.   ASSESSMENT: 67 y.o. female here for assessment of the following  1. Altered bowel habits   2. History of colon polyps   3. Family history of colon cancer    Reviewed her bowel habit history.  She does have a clear trigger with eating beef/pork, with abdominal pain and loose stools, reasonable to screen her for alpha gal, if she has that she would need to avoid beef pork and dairy.  Otherwise she will see if  there is any clear trigger foods that can make her symptoms worse, I gave her handout on a low FODMAP diet to see if that will help at all.  Will otherwise send her to the lab for CBC and TSH to make sure stable, especially being  on lithium.  Will also send stool for fecal lactoferrin.  In the interim we will give her some Bentyl to use as needed for abdominal pains and cramps and diarrhea, she can also try Imodium as needed.  We spent several minutes reviewing her family history of colon cancer and her prior colonoscopy history.  Based on national surveillance guidelines, her mother's history of colon cancer at her age diagnosed in 48s would not influence the frequency at which she has colonoscopy at this point in her life (only if she was dx at age 31 or earlier).  Frequency of her exams should be based on her polyp history, given 2 diminutive adenomas on her last exam I think she can go for 7 years from her last exam.  That being said she is very anxious about this and uncomfortable waiting that long, she would prefer to have it done in 5 years if possible which I think is reasonable for peace of mind.  I reassured her that her prior colonoscopies did not show any high risk or concerning polyps.  I certainly do not think she needs a colonoscopy now which was one of her main questions.  She understands and agrees with the plan as outlined following discussion today.  PLAN: - stool for fecal lactoferrin - serology for alpha gal - lab for CBC, TSH - low FODMAP diet - handouts given - bentyl '10mg'$  every 8 hours PRN #30 RF1 - immodium PRN - does not need a colonoscopy now. recall colonoscopy in 02/2024 for patient's piece of mind  Jolly Mango, MD [facility]

## 2022-09-26 NOTE — Telephone Encounter (Signed)
Called and LM for patient that I inadvertently missed one of the labs (Alpha gal) that Dr. Havery Moros wanted her to have today.  Asked her to get that done when she brings back the stool sample for the fecal lactoferrin Dr. Loni Muse ordered. Supplied her with the lab hours of 7:30am to 5:15pm Mon - Friday.  Apologized for the inconvenience.

## 2022-09-27 ENCOUNTER — Other Ambulatory Visit: Payer: Self-pay

## 2022-09-27 DIAGNOSIS — R194 Change in bowel habit: Secondary | ICD-10-CM

## 2022-09-27 DIAGNOSIS — Z86010 Personal history of colonic polyps: Secondary | ICD-10-CM

## 2022-09-27 DIAGNOSIS — Z800 Family history of malignant neoplasm of digestive organs: Secondary | ICD-10-CM

## 2022-09-27 LAB — ALPHA-1-ANTITRYPSIN: A-1 Antitrypsin, Ser: 96 mg/dL (ref 83–199)

## 2022-10-03 ENCOUNTER — Other Ambulatory Visit (INDEPENDENT_AMBULATORY_CARE_PROVIDER_SITE_OTHER): Payer: Medicare HMO

## 2022-10-03 DIAGNOSIS — Z800 Family history of malignant neoplasm of digestive organs: Secondary | ICD-10-CM

## 2022-10-03 DIAGNOSIS — Z86010 Personal history of colonic polyps: Secondary | ICD-10-CM | POA: Diagnosis not present

## 2022-10-03 DIAGNOSIS — R194 Change in bowel habit: Secondary | ICD-10-CM | POA: Diagnosis not present

## 2022-10-03 LAB — VITAMIN B12: Vitamin B-12: 186 pg/mL — ABNORMAL LOW (ref 211–911)

## 2022-10-03 LAB — FOLATE: Folate: 7.6 ng/mL (ref >5.9)

## 2022-10-04 ENCOUNTER — Other Ambulatory Visit: Payer: Self-pay

## 2022-10-04 DIAGNOSIS — R194 Change in bowel habit: Secondary | ICD-10-CM

## 2022-10-04 DIAGNOSIS — D519 Vitamin B12 deficiency anemia, unspecified: Secondary | ICD-10-CM

## 2022-10-04 MED ORDER — B-12 1000 MCG PO CAPS: 1.0000 | ORAL_CAPSULE | Freq: Every day | ORAL | 0 refills | Status: DC

## 2022-10-06 LAB — O215-IGE ALPHA-GAL: O215-IgE Alpha-Gal: 0.2 kU/L — AB

## 2022-10-09 LAB — FECAL LACTOFERRIN, QUANT
Fecal Lactoferrin: NEGATIVE
MICRO NUMBER:: 14419062
SPECIMEN QUALITY:: ADEQUATE

## 2022-10-14 ENCOUNTER — Other Ambulatory Visit: Payer: Self-pay | Admitting: Psychiatry

## 2022-10-14 DIAGNOSIS — F411 Generalized anxiety disorder: Secondary | ICD-10-CM

## 2022-10-14 DIAGNOSIS — F39 Unspecified mood [affective] disorder: Secondary | ICD-10-CM

## 2022-10-24 ENCOUNTER — Other Ambulatory Visit: Payer: Self-pay

## 2022-10-24 MED ORDER — CARVEDILOL 3.125 MG PO TABS: 3.1250 mg | ORAL_TABLET | Freq: Two times a day (BID) | ORAL | 3 refills | Status: DC

## 2022-11-12 DIAGNOSIS — H61002 Unspecified perichondritis of left external ear: Secondary | ICD-10-CM | POA: Diagnosis not present

## 2022-11-12 DIAGNOSIS — H61001 Unspecified perichondritis of right external ear: Secondary | ICD-10-CM | POA: Diagnosis not present

## 2022-11-12 DIAGNOSIS — X32XXXD Exposure to sunlight, subsequent encounter: Secondary | ICD-10-CM | POA: Diagnosis not present

## 2022-11-12 DIAGNOSIS — L570 Actinic keratosis: Secondary | ICD-10-CM | POA: Diagnosis not present

## 2022-11-13 ENCOUNTER — Encounter: Payer: Self-pay | Admitting: Psychiatry

## 2022-11-13 ENCOUNTER — Ambulatory Visit (INDEPENDENT_AMBULATORY_CARE_PROVIDER_SITE_OTHER): Payer: Medicare HMO | Admitting: Psychiatry

## 2022-11-13 DIAGNOSIS — Z79899 Other long term (current) drug therapy: Secondary | ICD-10-CM

## 2022-11-13 DIAGNOSIS — F39 Unspecified mood [affective] disorder: Secondary | ICD-10-CM

## 2022-11-13 DIAGNOSIS — F5105 Insomnia due to other mental disorder: Secondary | ICD-10-CM | POA: Diagnosis not present

## 2022-11-13 DIAGNOSIS — F331 Major depressive disorder, recurrent, moderate: Secondary | ICD-10-CM

## 2022-11-13 DIAGNOSIS — F411 Generalized anxiety disorder: Secondary | ICD-10-CM

## 2022-11-13 DIAGNOSIS — E871 Hypo-osmolality and hyponatremia: Secondary | ICD-10-CM | POA: Diagnosis not present

## 2022-11-13 MED ORDER — ALPRAZOLAM 0.5 MG PO TABS: 0.5000 mg | ORAL_TABLET | Freq: Two times a day (BID) | ORAL | 1 refills | Status: DC | PRN

## 2022-11-13 MED ORDER — TRAZODONE HCL 50 MG PO TABS: 50.0000 mg | ORAL_TABLET | Freq: Every day | ORAL | 1 refills | Status: DC

## 2022-11-13 MED ORDER — PAROXETINE HCL 10 MG PO TABS: 10.0000 mg | ORAL_TABLET | Freq: Every day | ORAL | 1 refills | Status: DC

## 2022-11-13 MED ORDER — LITHIUM CARBONATE 150 MG PO CAPS: ORAL_CAPSULE | ORAL | 1 refills | Status: DC

## 2022-11-13 MED ORDER — BUPROPION HCL ER (SR) 100 MG PO TB12: 100.0000 mg | ORAL_TABLET | Freq: Every day | ORAL | 1 refills | Status: DC

## 2022-11-13 NOTE — Progress Notes (Signed)
Robin Barton SX:[id number] 11-Oct-1955 67 y.o.    Subjective:   Patient ID:  Robin Barton is a 67 y.o. (DOB September 25, 1955) female.  Chief Complaint:  Chief Complaint  Patient presents with   Follow-up   Depression   Anxiety   Sleeping Problem    Depression        Associated symptoms include myalgias.  Associated symptoms include no decreased concentration, no fatigue and no suicidal ideas.  Past medical history includes anxiety.   Anxiety Patient reports no confusion, decreased concentration, dizziness, nervous/anxious behavior or suicidal ideas.     Robin Barton presents to the office today for follow-up mood and anxiety.  seen October 2020.  No meds were changed.  She was doing well psychiatrically.  It was recommended she get a lithium level and BMP and Continue paroxetine 20 mg daily, continue trazodone 50 mg nightly, continue lithium 150 mg alternating with 300 mg every other day.  January 17, 2020 appointment the following is noted: .Ok.  Exposed to virus twice and had to QT.  Had to hlep sister with med probvlems.   Forgot the labs.   Gotten vaccinated. Not taking Xanax.   Sleeps [place] with trazodone. Forgot to get labs after reducing the lithium.  Tremor is much better and manageable with the reduction of lithium.  No worsening of mood with the change. Tremor has gotten better on it's own.  No longer needs B6. Plan: Continue paroxetine 20 mg daily, continue trazodone 50 mg nightly, continue lithium 150 mg alternating with 300 mg every other day. Asks for alprazolam prn anxiety.  August 2021 it was discovered she had low sodium and was just recommended she discuss with PCP.  She stopped trazodone and her sodium level improved.  06/26/20 appt noted: Reduced paroxetine to 10 mg from 20 mg daily and stopped trazodone. Low sodium SE caused scary neuro sx.  Had gotten confused and was having tearful episodes.  Sx had developed gradually over time.   Balance  issues.   I feel so much better.    Taking salt tablet twice daily. BP is fine.   Emotionally feels good.  On a cleaning rampage at this time.    Lost a good friend to ALS.   Taking Xanax instead of trazodone for sleep. Plan: No med changes today. Continue lower paroxetine 10 mg daily,  continue lithium 150 mg alternating with 300 mg every other day.  Continue salt tablets Stopped trazodone DT hyponatremia. Asks for alprazolam prn anxiety or insomnia since off the Xanax..  09/26/2020 appointment with following noted: Xanax 0.5 mg HS. Still on salt, paroxetine 10, lithium 300/150 QOD. Asks to take Xanax during the day for anxiety.  Don't seem to have the drive she used to have but once done she feels great.  Some avoidance like social things.  Energy is good. Possibly more anxious at lower dose paroxetine. No crying spells.  H notices reduction in motivation and she doesn't seem like herself. Smokes and needs to stop. Plan: Reduce caffeine bc she gets anxious after taking it and has not reduced as requested. wellbutrin XL 150 mg 1 for 1 week in the AM, then 2 each morning. Repeat serum sodium  11/13/2020 appointment with following noted: Wellbutrin helped me settle down and reduced craving to smoke.  Never got above 150 mg XL.   SE night sweats. Still continues paroxetine 10. Not sleeping as well either with awakening with sweats.  Cut smoking way back to as  little as 3-5 per day with less craving from 1/2 to 1 PPD. Emotionally felt better with better task completion.  Better motivation.  Don't feel as anxious in AM and less coffee in the AM.  Plan: Switch to Wellbutrin SR 100 mg AM to reduce SE.  It's helping.  02/06/2021 appointment with the following noted: Not sleeping well with night sweats awakens..Still gets at least 8 hours but prefers 10 h.  Not drowsy. Wonders if related to Wellbutrin.  Feels she needs 1 mg alprazolam to get best response from it.  No hangover.  Felt sleep better  with trazodone but afraid of repeat hyponatremia.   Some depression.  Feels a little lost and not real motivated but not everyday.  Thinks needs job and schedule.  Good enjoyment but less motivation. Kids don't live here and Robin Barton gone a lot on golf trips. Antsy in the morning after up 10-15 mins and reduced caffeine and it helped. Not anxious all day.   Wellbutrin reduced smoking by 50%.  Don't want to give Wellbutrin up.  No daytime XaNAX.   Plan: Switch to Wellbutrin SR 100 mg AM to reduce SE.  It's helping. Disc smoking cessation.. Asks for alprazolam prn anxiety or insomnia since off the trazodone. Ok increase to 0.75 mg HS Continue lower paroxetine 10 mg daily,  continue lithium 150 mg alternating with 300 mg every other day.   06/05/21 appt noted:   Fair.  Not sleeping well.  EMA and jus t lays there in the Barton.  Then feels like crap the next day.  Happens 5 days per week.  Took trazdone other night without Xanax without more help. Had slept well with trazodone. Night sweats after she awakens.  Tried black [place] intermittently.   Redeuced coffee. Smoking better but hasn't quit.  3-5 per day.   .Still having tremor Some occ dep but not usually.  .  Some anxiety over doing things..    Still feels anxious in AM same pattern..  Drinks coffee.  Never had ADD but anxious about doing things she wants to do but doesn't get done..   Denies appetite disturbance.  Patient reports that energy and motivation have been good.  Patient denies any difficulty with concentration.  Patient denies any suicidal ideation. Plan: Switch to Wellbutrin IR 75 mg AM to reduce insomnia SE.  It's helping. Disc smoking cessation..  10/16/2021 appt noted: [place] with goldfish. Still not sleeping very well. Klonopin 0.75 wihtout help. Switched back to Xanax 0.75 mg HS but not as good as trazodone. Sleep adequate hours. But awaken in middle of night thinking about things. Back pain unusually and muscular.  RX  Robaxin but didn't take yet.  Has cyclobenzaprine. Sleep doesn't feel restuful. Mood OK not great.  Should exercise. Some anxiety in the AM Plan: Try cyclobenzaprine for sleep and back pain.  She has some Increase Wellbutrin to 100 mg AM  for mood  01/09/22 appt noted: Flexeril didn't work for EMA with worry. Last week restarted trazodone 50 mg HS and it is working. Taking paroxetine 10 and Wellbutrin SR 100 AM.  Tolerated fine.   Energy pretty good. Low sodium 130.  Increased salt tablets to 2 daily.   Some anxiety in the AM. Mood up and down with down lasting 24 hours and seems related to insomnia or not. Trazodone works better for sleep than Xanax.    03/12/2022 appointment with the following noted: Continues Wellbutrin SR 100 mg every morning, cyclobenzaprine  10 mg nightly only rarely, lithium 300 alternating with 150 every other night, paroxetine 10 mg, trazodone 50 mg nightly Pretty good usually.   PE early June sodium up to 138.  Stopped sodium after that appt.  BC gets stuck in throat. Don't drink enough water.   Reports high liver enzyme apparently minor bc PCP didn't order more testing. Patient reports stable mood and denies depressed or irritable moods.  Patient denies any recent difficulty with anxiety.  Patient denies difficulty with sleep initiation or maintenance. Denies appetite disturbance.  Patient reports that energy and motivation have been good.  Patient denies any difficulty with concentration.  Patient denies any suicidal ideation. Plan: No med changes Continues Wellbutrin SR 100 mg every morning, cyclobenzaprine 10 mg nightly only rarely, lithium 300 alternating with 150 every other night, paroxetine 10 mg, trazodone 50 mg nightly Check CMP  07/16/22 appt noted: Lost paperwork for CMP. Can't swallow B6 or salt tablets.  Tried cutting both in half.  They get hung in throat. Otherwise doing great.  Patient reports stable mood and denies depressed or irritable moods.   Patient denies any recent difficulty with anxiety.  Patient denies difficulty with sleep initiation but EMA about 4 AM often.  Denies appetite disturbance.  Patient reports that energy and motivation have been good.  Patient denies any difficulty with concentration.  Patient denies any suicidal ideation. Sleep better with Xanax than trazodone. Not using alprazolam for anxiety. Plan: No med changes except sleeper: Continues Wellbutrin SR 100 mg every morning, cyclobenzaprine 10 mg nightly only rarely, lithium 300 alternating with 150 every other night, paroxetine 10 mg,  DT low sodium try stopping trazodone and use alprazolam prn  11/13/22 appt noted: Avg 8 hours.  On trazodone. Not depressed .  Some days feels bored.  Not much on schedule to occupy her.  Does help some older women in neighborhood.  [place] with flower arrangements. Needs to start exercise.. First GS in [place].  Don't get to see them much.  Born in Sept.  No SE issues with meds.   Watching coffee intake. No xanax lately.  Past Psychiatric Medication Trials: paroxetine, several intolerant SSRI's Wellbutrin SR 100 ? ins lithium Xanax,  trazodone low sodium  History of Neuro eval for tremor.  Review of Systems:  Review of Systems  Constitutional:  Negative for fatigue.  Musculoskeletal:  Positive for back pain and myalgias.  Neurological:  Negative for dizziness, tremors and light-headedness.  Psychiatric/Behavioral:  Negative for agitation, behavioral problems, confusion, decreased concentration, dysphoric mood, hallucinations, self-injury, sleep disturbance and suicidal ideas. The patient is not nervous/anxious and is not hyperactive.     Medications: I have reviewed the patient's current medications.  Current Outpatient Medications  Medication Sig Dispense Refill   aspirin EC 81 MG tablet Take 81 mg by mouth daily. Swallow whole.     carvedilol (COREG) 3.125 MG tablet Take 1 tablet (3.125 mg total) by  mouth 2 (two) times daily. 180 tablet 3   Cholecalciferol (VITAMIN D3) 1.25 MG (50000 UT) CAPS every 14 (fourteen) days.      Cyanocobalamin (B-12) 1000 MCG CAPS Take 1 capsule by mouth daily. 30 capsule 0   cyclobenzaprine (FLEXERIL) 10 MG tablet Take 1 tablet (10 mg total) by mouth at bedtime. 90 tablet 0   dicyclomine (BENTYL) 10 MG capsule Take 1 capsule (10 mg total) by mouth every 8 (eight) hours as needed for spasms. 30 capsule 1   lisinopril (ZESTRIL) 40 MG tablet Take 1  tablet (40 mg total) by mouth daily. 90 tablet 3   loperamide (IMODIUM A-D) 2 MG tablet Take 1 tablet (2 mg total) by mouth as needed for diarrhea or loose stools. 30 tablet 0   magnesium oxide (MAG-OX) 400 MG tablet Take 400 mg by mouth daily.     nicotine (NICODERM CQ) 14 mg/24hr patch Place 1 patch (14 mg total) onto the skin daily. 30 patch 3   Pyridoxine HCl (VITAMIN B-6) 500 MG tablet Take 500 mg by mouth as needed.      rosuvastatin (CRESTOR) 20 MG tablet Take 1 tablet (20 mg total) by mouth daily. 90 tablet 3   SODIUM CHLORIDE PO Take by mouth in the morning and at bedtime.     ALPRAZolam (XANAX) 0.5 MG tablet Take 1 tablet (0.5 mg total) by mouth 2 (two) times daily as needed for anxiety. 45 tablet 1   buPROPion ER (WELLBUTRIN SR) 100 MG 12 hr tablet Take 1 tablet (100 mg total) by mouth daily. 90 tablet 1   lithium carbonate 150 MG capsule ALTERNATE TAKING 2 CAPSULES WITH 1 CAPSULE BY MOUTH EVERY OTHER NIGHT 135 capsule 1   PARoxetine (PAXIL) 10 MG tablet Take 1 tablet (10 mg total) by mouth daily. 90 tablet 1   traZODone (DESYREL) 50 MG tablet Take 1 tablet (50 mg total) by mouth at bedtime. 90 tablet 1   No current facility-administered medications for this visit.    Medication Side Effects: Other: tremor recently better  Allergies:  Allergies  Allergen Reactions   Sulfonamide Derivatives Hives, Itching and Rash    Past Medical History:  Diagnosis Date   Acid reflux    Anxiety    Atypical lobular  hyperplasia of left breast 11/25/13   lumpectomy, 12/10/13   BCC (basal cell carcinoma)    per pt, never had cancer   Depression    Hyperlipidemia    Hypertension    Osteopenia    Thyroid disease 2010   rt thyroid nodule    Family History  Problem Relation Age of Onset   Cancer Mother        colon-stage 4   Hypertension Mother    Colon cancer Mother    Other Father    Breast cancer Sister    Colon polyps Neg Hx    Esophageal cancer Neg Hx    Rectal cancer Neg Hx    Stomach cancer Neg Hx     Social History   Socioeconomic History   Marital status: Married    Spouse name: Not on file   Number of children: 2   Years of education: Not on file   Highest education level: Not on file  Occupational History   Not on file  Tobacco Use   Smoking status: Some Days    Packs/day: 0.50    Years: 35.00    Total pack years: 17.50    Types: Cigarettes   Smokeless tobacco: Never  Vaping Use   Vaping Use: Former  Substance and Sexual Activity   Alcohol use: Yes    Alcohol/week: 12.0 - 14.0 standard drinks of alcohol    Types: 12 - 14 Glasses of wine per week   Drug use: No   Sexual activity: Yes    Partners: Male    Birth control/protection: Post-menopausal  Other Topics Concern   Not on file  Social History Narrative   Not on file   Social Determinants of Health   Financial Resource Strain: Not on file  Food Insecurity: Not on file  Transportation Needs: Not on file  Physical Activity: Not on file  Stress: Not on file  Social Connections: Not on file  Intimate Partner Violence: Not on file    Past Medical History, Surgical history, Social history, and Family history were reviewed and updated as appropriate.   Please see review of systems for further details on the patient's review from today.   Objective:   Physical Exam:  LMP 09/24/1999   Physical Exam Constitutional:      General: She is not in acute distress.    Appearance: She is well-developed.   Musculoskeletal:        General: No deformity.  Neurological:     Mental Status: She is alert and oriented to person, place, and time.     Cranial Nerves: No dysarthria.     Coordination: Coordination normal.  Psychiatric:        Attention and Perception: Attention and perception normal. She does not perceive auditory or visual hallucinations.        Mood and Affect: Mood is anxious. Mood is not depressed. Affect is not labile, blunt, angry or inappropriate.        Speech: Speech normal. Speech is not rapid and pressured or slurred.        Behavior: Behavior normal. Behavior is cooperative.        Thought Content: Thought content normal. Thought content is not paranoid or delusional. Thought content does not include homicidal or suicidal ideation. Thought content does not include suicidal plan.        Cognition and Memory: Cognition and memory normal.        Judgment: Judgment normal.     Comments: Insight good No evidence for cognitive issues. Minimal anxiety and better than the past.     Lab Review:     Component Value Date/Time   NA 132 (L) 08/02/2022 1447   NA 139 07/19/2014 0903   K 5.1 08/02/2022 1447   K 4.7 07/19/2014 0903   CL 99 08/02/2022 1447   CO2 24 08/02/2022 1447   CO2 24 07/19/2014 0903   GLUCOSE 79 08/02/2022 1447   GLUCOSE 95 07/19/2014 0903   BUN 31 (H) 08/02/2022 1447   BUN 22.8 07/19/2014 0903   CREATININE 1.11 (H) 08/02/2022 1447   CREATININE 0.8 07/19/2014 0903   CALCIUM 9.6 08/02/2022 1447   CALCIUM 9.6 07/19/2014 0903   PROT 7.0 08/02/2022 1447   PROT 7.3 07/19/2014 0903   ALBUMIN 4.1 07/19/2014 0903   AST 29 08/02/2022 1447   AST 23 07/19/2014 0903   ALT 22 08/02/2022 1447   ALT 21 07/19/2014 0903   ALKPHOS 54 07/19/2014 0903   BILITOT 0.5 08/02/2022 1447   BILITOT 0.49 07/19/2014 0903   GFRNONAA 53 (L) 06/18/2018 0934   GFRAA 61 06/18/2018 0934       Component Value Date/Time   WBC 6.4 09/26/2022 1109   RBC 3.61 (L) 09/26/2022 1109    HGB 12.4 09/26/2022 1109   HGB 12.5 07/19/2014 0903   HCT 36.8 09/26/2022 1109   HCT 37.8 07/19/2014 0903   PLT 424.0 (H) 09/26/2022 1109   PLT 370 07/19/2014 0903   MCV 102.0 (H) 09/26/2022 1109   MCV 96.0 07/19/2014 0903   MCH 31.8 07/19/2014 0903   MCH 31.9 04/05/2011 0923   MCHC 33.7 09/26/2022 1109   RDW 13.0 09/26/2022 1109   RDW 12.8 07/19/2014 0903   LYMPHSABS 1.4 09/26/2022 1109   LYMPHSABS 1.5  07/19/2014 0903   MONOABS 0.7 09/26/2022 1109   MONOABS 0.6 07/19/2014 0903   EOSABS 0.1 09/26/2022 1109   EOSABS 0.1 07/19/2014 0903   BASOSABS 0.1 09/26/2022 1109   BASOSABS 0.0 07/19/2014 0903    Lithium Lvl  Date Value Ref Range Status  08/02/2022 0.5 (L) 0.6 - 1.2 mmol/L Final     No results found for: "PHENYTOIN", "PHENOBARB", "VALPROATE", "CBMZ"   .res Assessment: Plan:    Major depressive disorder, recurrent episode, moderate (HCC) - Plan: buPROPion ER (WELLBUTRIN SR) 100 MG 12 hr tablet, lithium carbonate 150 MG capsule, PARoxetine (PAXIL) 10 MG tablet  Generalized anxiety disorder - Plan: ALPRAZolam (XANAX) 0.5 MG tablet, PARoxetine (PAXIL) 10 MG tablet  Insomnia due to mental condition - Plan: ALPRAZolam (XANAX) 0.5 MG tablet, traZODone (DESYREL) 50 MG tablet  Hyponatremia  Lithium use  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  Overall she remained stable after the reduction in both lithium and paroxetine to current dosages.  Better motivation and task completion and less msoking with Wellbutrin. To help mood will focus on sleep problems.  We discussed her history of response to lithium at the lower dose 150 and then the 300 mg a day.  We discussed her side effects of tremor.  It's better now.   The tremor is better with the reduction to the current dose of lithium and the use of B6.  It is now manageable.  Disc option stopping B6 to see if she still needs it. Last lithium level is low  normal.  We discussed the question of her diagnosis given at prior history of 2 weeks of hypomania but that may have been related to medications.  The uncertainty whether she truly has major depression versus bipolar disorder remains.  We discussed the risk of antidepressants causing mood cycling and the advantage to being on a lower dose.  Repeat Sodium 3/23 130 off trazodone Sodium 02/2022 138 on 2 gm daily, then she stopped it. Risk going lower off the salt tablets.  disc risk low sodium. Rec repeat level If remains low then switch paroxetine to Viibryd.  Answered questions about each med and DDI and risks to liver and kidney  Continue lower paroxetine 10 mg daily,   continue lithium 150 mg alternating with 300 mg every other day.  Counseled patient regarding potential benefits, risks, and side effects of lithium to include potential risk of lithium affecting thyroid and renal function.  Discussed need for periodic lab monitoring to determine drug level and to assess for potential adverse effects.  Counseled patient regarding signs and symptoms of lithium toxicity and advised that they notify office immediately or seek urgent medical attention if experiencing these signs and symptoms.  Patient advised to contact office with any questions or concerns. B6 helped lithium tremor  Trial black cohush.  continue Wellbutrin to 100 mg AM  for mood.  Consider increase if needed. We discussed the short-term risks associated with benzodiazepines including sedation and increased fall risk among others.  Discussed long-term side effect risk including dependence, potential withdrawal symptoms, and the potential eventual dose-related risk of dementia.  But recent studies from 2020 dispute this association between benzodiazepines and dementia risk. Newer studies in 2020 do not support an association with dementia. Disc SE worsen with age.  Disc smoking cessation.. Watch caffeine for tremor.  She has done  this.  No med changes except sleeper: Continues Wellbutrin SR 100 mg every  morning, cyclobenzaprine 10 mg nightly only rarely, lithium 300 alternating with 150 every other night, paroxetine 10 mg, trazodone 50 mg HS  Last sodium 132 on 08/02/22 on trazodone and lithium level 0.5 on 450 mg daily Rec 1 salt tablet daily Disc risks and continue to be attentive  consider Abilify potentiation  Follow-up 4-6 mos  Lynder Parents MD, DFAPA\  Future Appointments  Date Time Provider [place]  03/05/2023 11:20 AM Freada Bergeron, MD [id number]     No orders of the defined types were placed in this encounter.     -------------------------------

## 2022-11-15 ENCOUNTER — Other Ambulatory Visit: Payer: Self-pay | Admitting: Psychiatry

## 2022-11-15 DIAGNOSIS — F5105 Insomnia due to other mental disorder: Secondary | ICD-10-CM

## 2023-01-03 ENCOUNTER — Telehealth: Payer: Self-pay

## 2023-01-03 DIAGNOSIS — R194 Change in bowel habit: Secondary | ICD-10-CM

## 2023-01-03 DIAGNOSIS — D519 Vitamin B12 deficiency anemia, unspecified: Secondary | ICD-10-CM

## 2023-01-03 NOTE — Telephone Encounter (Signed)
-----   Message from Missy Sabins, RN sent at 10/04/2022  1:03 PM EST ----- Regarding: Labs CBC and B12 - need to enter orders

## 2023-01-03 NOTE — Telephone Encounter (Signed)
MyChart message sent to patient with lab reminder. Lab orders in epic. 

## 2023-01-07 ENCOUNTER — Other Ambulatory Visit: Payer: Self-pay | Admitting: *Deleted

## 2023-01-08 NOTE — Telephone Encounter (Signed)
Lm on vm for patient to return call 

## 2023-01-09 NOTE — Telephone Encounter (Signed)
Spoke with patient to remind her that she is due for repeat labs at this time. No appointment is necessary. Patient is aware that she can stop by the lab in the basement at her convenience between 7:30 AM - 5 PM, Monday through Friday. Patient verbalized understanding and had no concerns at the end of the call.   

## 2023-01-09 NOTE — Telephone Encounter (Signed)
Inbound call from patient returning call. Please advise.  Thank you  

## 2023-01-15 DIAGNOSIS — M533 Sacrococcygeal disorders, not elsewhere classified: Secondary | ICD-10-CM | POA: Diagnosis not present

## 2023-01-15 DIAGNOSIS — M62830 Muscle spasm of back: Secondary | ICD-10-CM | POA: Diagnosis not present

## 2023-01-22 ENCOUNTER — Other Ambulatory Visit (INDEPENDENT_AMBULATORY_CARE_PROVIDER_SITE_OTHER): Payer: Medicare HMO

## 2023-01-22 DIAGNOSIS — D519 Vitamin B12 deficiency anemia, unspecified: Secondary | ICD-10-CM

## 2023-01-22 DIAGNOSIS — R194 Change in bowel habit: Secondary | ICD-10-CM

## 2023-01-22 LAB — CBC WITH DIFFERENTIAL/PLATELET
Basophils Absolute: 0.1 10*3/uL (ref 0.0–0.1)
Basophils Relative: 1 % (ref 0.0–3.0)
Eosinophils Absolute: 0.2 10*3/uL (ref 0.0–0.7)
Eosinophils Relative: 2.5 % (ref 0.0–5.0)
HCT: 32.8 % — ABNORMAL LOW (ref 36.0–46.0)
Hemoglobin: 11.2 g/dL — ABNORMAL LOW (ref 12.0–15.0)
Lymphocytes Relative: 20.8 % (ref 12.0–46.0)
Lymphs Abs: 1.5 10*3/uL (ref 0.7–4.0)
MCHC: 34.2 g/dL (ref 30.0–36.0)
MCV: 101.4 fl — ABNORMAL HIGH (ref 78.0–100.0)
Monocytes Absolute: 0.9 10*3/uL (ref 0.1–1.0)
Monocytes Relative: 11.5 % (ref 3.0–12.0)
Neutro Abs: 4.8 10*3/uL (ref 1.4–7.7)
Neutrophils Relative %: 64.2 % (ref 43.0–77.0)
Platelets: 423 10*3/uL — ABNORMAL HIGH (ref 150.0–400.0)
RBC: 3.23 Mil/uL — ABNORMAL LOW (ref 3.87–5.11)
RDW: 12.8 % (ref 11.5–15.5)
WBC: 7.4 10*3/uL (ref 4.0–10.5)

## 2023-01-22 LAB — VITAMIN B12: Vitamin B-12: 176 pg/mL — ABNORMAL LOW (ref 211–911)

## 2023-01-23 LAB — TISSUE TRANSGLUTAMINASE ABS,IGG,IGA
(tTG) Ab, IgA: <1 U/mL
(tTG) Ab, IgG: <1 U/mL

## 2023-01-23 LAB — IGA: Immunoglobulin A: 216 mg/dL (ref 70–320)

## 2023-01-24 ENCOUNTER — Other Ambulatory Visit: Payer: Self-pay

## 2023-01-24 DIAGNOSIS — D519 Vitamin B12 deficiency anemia, unspecified: Secondary | ICD-10-CM

## 2023-01-24 MED ORDER — CYANOCOBALAMIN 1000 MCG/ML IJ SOLN: 1000.0000 ug | INTRAMUSCULAR | 0 refills | Status: DC

## 2023-01-24 MED ORDER — CYANOCOBALAMIN 1000 MCG/ML IJ SOLN: 1000.0000 ug | INTRAMUSCULAR | 0 refills | Status: AC

## 2023-02-17 ENCOUNTER — Other Ambulatory Visit: Payer: Self-pay | Admitting: Psychiatry

## 2023-02-17 DIAGNOSIS — F331 Major depressive disorder, recurrent, moderate: Secondary | ICD-10-CM

## 2023-02-17 DIAGNOSIS — F5105 Insomnia due to other mental disorder: Secondary | ICD-10-CM

## 2023-03-05 ENCOUNTER — Ambulatory Visit: Payer: Medicare HMO | Admitting: Cardiology

## 2023-03-11 DIAGNOSIS — E785 Hyperlipidemia, unspecified: Secondary | ICD-10-CM | POA: Diagnosis not present

## 2023-03-11 DIAGNOSIS — E041 Nontoxic single thyroid nodule: Secondary | ICD-10-CM | POA: Diagnosis not present

## 2023-03-11 DIAGNOSIS — I10 Essential (primary) hypertension: Secondary | ICD-10-CM | POA: Diagnosis not present

## 2023-03-11 DIAGNOSIS — K219 Gastro-esophageal reflux disease without esophagitis: Secondary | ICD-10-CM | POA: Diagnosis not present

## 2023-03-11 DIAGNOSIS — E559 Vitamin D deficiency, unspecified: Secondary | ICD-10-CM | POA: Diagnosis not present

## 2023-03-18 DIAGNOSIS — F17200 Nicotine dependence, unspecified, uncomplicated: Secondary | ICD-10-CM | POA: Diagnosis not present

## 2023-03-18 DIAGNOSIS — I2584 Coronary atherosclerosis due to calcified coronary lesion: Secondary | ICD-10-CM | POA: Diagnosis not present

## 2023-03-18 DIAGNOSIS — E538 Deficiency of other specified B group vitamins: Secondary | ICD-10-CM | POA: Diagnosis not present

## 2023-03-18 DIAGNOSIS — R198 Other specified symptoms and signs involving the digestive system and abdomen: Secondary | ICD-10-CM | POA: Diagnosis not present

## 2023-03-18 DIAGNOSIS — Z1331 Encounter for screening for depression: Secondary | ICD-10-CM | POA: Diagnosis not present

## 2023-03-18 DIAGNOSIS — Z1339 Encounter for screening examination for other mental health and behavioral disorders: Secondary | ICD-10-CM | POA: Diagnosis not present

## 2023-03-18 DIAGNOSIS — F330 Major depressive disorder, recurrent, mild: Secondary | ICD-10-CM | POA: Diagnosis not present

## 2023-03-18 DIAGNOSIS — Z0000 Encounter for general adult medical examination without abnormal findings: Secondary | ICD-10-CM | POA: Diagnosis not present

## 2023-03-18 DIAGNOSIS — E785 Hyperlipidemia, unspecified: Secondary | ICD-10-CM | POA: Diagnosis not present

## 2023-03-18 DIAGNOSIS — I129 Hypertensive chronic kidney disease with stage 1 through stage 4 chronic kidney disease, or unspecified chronic kidney disease: Secondary | ICD-10-CM | POA: Diagnosis not present

## 2023-03-18 DIAGNOSIS — N1831 Chronic kidney disease, stage 3a: Secondary | ICD-10-CM | POA: Diagnosis not present

## 2023-03-18 DIAGNOSIS — R82998 Other abnormal findings in urine: Secondary | ICD-10-CM | POA: Diagnosis not present

## 2023-03-19 NOTE — Progress Notes (Unsigned)
Cardiology Office Note:    Date:  03/24/2023   ID:  Robin Barton, DOB Feb 05, 1956, MRN [id number]  PCP:  Robin Penna, MD  [facility] Cardiologist:  None  [facility] Electrophysiologist:  None   Referring MD: Robin Penna, MD    History of Present Illness:    Robin Barton is a 67 y.o. female with a hx of GERD, anxiety, depression, HLD, HTN and elevated coronary calcium score who returns to clinic for follow-up.  Patient was seen in clinic on 07/27/20 where she was doing well. No exertional symptoms. Calcium score 702. TTE was obtained which showed normal BiV function, no significant valvular abnormalities. She was continued on ASA, her crestor was increased to 20mg  daily, and her atenolol was changed to coreg. She now returns to clinic for follow-up.  Last seen in clinic on 03/2022 where she was doing well from a CV standpoint.  Today, the patient overall feels well. Was seen by her PCP last week where her potassium was elevated and therefore her lisinopril was decreased to 20mg  daily and coreg was increased 6.25mg  BID. BP soft today in 90s on multiple checks but denies orthostatic symptoms.  Otherwise, no chest pain, SOB, orthopnea, palpitations, or PND. She has been started on B12 injections due to low levels. Continues to smoke but is working on quitting again.    Past Medical History:  Diagnosis Date   Acid reflux    Anxiety    Atypical lobular hyperplasia of left breast 11/25/13   lumpectomy, 12/10/13   BCC (basal cell carcinoma)    per pt, never had cancer   Depression    Hyperlipidemia    Hypertension    Osteopenia    Thyroid disease 2010   rt thyroid nodule    Past Surgical History:  Procedure Laterality Date   BREAST LUMPECTOMY WITH RADIOACTIVE SEED LOCALIZATION Left 12/10/2013   Procedure: BREAST LUMPECTOMY WITH RADIOACTIVE SEED LOCALIZATION;  Surgeon: Ernestene Mention, MD;  Location: [facility];  Service: General;   Laterality: Left;   BUNIONECTOMY  2/13 left foot and toe straightened   BUNIONECTOMY  3/13 right foot and toe straightened   COLONOSCOPY     NASAL SEPTUM SURGERY  1982    Current Medications: Current Meds  Medication Sig   ALPRAZolam (XANAX) 0.5 MG tablet Take 1 tablet (0.5 mg total) by mouth 2 (two) times daily as needed for anxiety.   aspirin EC 81 MG tablet Take 81 mg by mouth daily. Swallow whole.   buPROPion ER (WELLBUTRIN SR) 100 MG 12 hr tablet Take 1 tablet (100 mg total) by mouth daily.   carvedilol (COREG) 3.125 MG tablet Take 1 tablet (3.125 mg total) by mouth 2 (two) times daily with a meal.   Cholecalciferol (VITAMIN D3) 1.25 MG (50000 UT) CAPS every 14 (fourteen) days.    cyanocobalamin (VITAMIN B12) 1000 MCG/ML injection Inject 1 mL (1,000 mcg total) into the muscle once a week. (Patient taking differently: Inject 1,000 mcg into the muscle once a week. For 4 weeks)   cyanocobalamin (VITAMIN B12) 1000 MCG/ML injection Inject 1 mL (1,000 mcg total) into the muscle every 30 (thirty) days.   cyclobenzaprine (FLEXERIL) 10 MG tablet Take 1 tablet (10 mg total) by mouth at bedtime.   dicyclomine (BENTYL) 10 MG capsule Take 1 capsule (10 mg total) by mouth every 8 (eight) hours as needed for spasms.   ezetimibe (ZETIA) 10 MG tablet Take 1 tablet (10 mg total) by mouth daily.  lisinopril (ZESTRIL) 40 MG tablet Take 1 tablet (40 mg total) by mouth daily. (Patient taking differently: Take 20 mg by mouth daily.)   lithium carbonate 150 MG capsule ALTERNATE TAKING 2 CAPSULES BY MOUTH WITH 1 CAPSULE EVERY OTHER NIGHT   loperamide (IMODIUM A-D) 2 MG tablet Take 1 tablet (2 mg total) by mouth as needed for diarrhea or loose stools.   magnesium oxide (MAG-OX) 400 MG tablet Take 400 mg by mouth daily.   nicotine (NICODERM CQ) 14 mg/24hr patch Place 1 patch (14 mg total) onto the skin daily.   PARoxetine (PAXIL) 10 MG tablet Take 1 tablet (10 mg total) by mouth daily.   Pyridoxine HCl (VITAMIN  B-6) 500 MG tablet Take 500 mg by mouth as needed.    rosuvastatin (CRESTOR) 20 MG tablet Take 1 tablet (20 mg total) by mouth daily.   SODIUM CHLORIDE PO Take by mouth in the morning and at bedtime.   traZODone (DESYREL) 50 MG tablet TAKE 1 TABLET(50 MG) BY MOUTH AT BEDTIME   [DISCONTINUED] carvedilol (COREG) 3.125 MG tablet Take 1 tablet (3.125 mg total) by mouth 2 (two) times daily. (Patient taking differently: Take 6.25 mg by mouth 2 (two) times daily.)     Allergies:   Sulfonamide derivatives   Social History   Socioeconomic History   Marital status: Married    Spouse name: Not on file   Number of children: 2   Years of education: Not on file   Highest education level: Not on file  Occupational History   Not on file  Tobacco Use   Smoking status: Some Days    Packs/day: 0.50    Years: 35.00    Additional pack years: 0.00    Total pack years: 17.50    Types: Cigarettes   Smokeless tobacco: Never  Vaping Use   Vaping Use: Former  Substance and Sexual Activity   Alcohol use: Yes    Alcohol/week: 12.0 - 14.0 standard drinks of alcohol    Types: 12 - 14 Glasses of wine per week   Drug use: No   Sexual activity: Yes    Partners: Male    Birth control/protection: Post-menopausal  Other Topics Concern   Not on file  Social History Narrative   Not on file   Social Determinants of Health   Financial Resource Strain: Not on file  Food Insecurity: Not on file  Transportation Needs: Not on file  Physical Activity: Not on file  Stress: Not on file  Social Connections: Not on file     Family History: The patient's family history includes Breast cancer in her sister; Cancer in her mother; Colon cancer in her mother; Hypertension in her mother; Other in her father. There is no history of Colon polyps, Esophageal cancer, Rectal cancer, or Stomach cancer.  ROS:   Please see the history of present illness.      EKGs/Labs/Other Studies Reviewed:    The following studies  were reviewed today:  TTE 08/24/20: IMPRESSIONS   1. Left ventricular ejection fraction, by estimation, is 60 to 65%. The  left ventricle has normal function. The left ventricle has no regional  wall motion abnormalities. Left ventricular diastolic parameters were  normal.   2. Right ventricular systolic function is normal. The right ventricular  size is normal. There is normal pulmonary artery systolic pressure.   3. The mitral valve is normal in structure. No evidence of mitral valve  regurgitation. No evidence of mitral stenosis.   4. The  aortic valve is normal in structure. Aortic valve regurgitation is  not visualized. No aortic stenosis is present.   5. The inferior vena cava is normal in size with greater than 50%  respiratory variability, suggesting right atrial pressure of 3 mmHg.  CT 07/05/20: CORONARY CALCIUM SCORES:   Left Main: 0   LAD: 274   LCx: 177   RCA: 250   Total Agatston Score: 702   MESA database percentile: 97   AORTA MEASUREMENTS:   Ascending Aorta: 31 mm   Descending Aorta: 23 mm   OTHER FINDINGS:   Heart is normal size. No adenopathy. Visualized lungs clear. No effusions. Imaging into the upper abdomen demonstrates no acute findings. Chest wall soft tissues are unremarkable. No acute bony abnormality.   IMPRESSION: The observed calcium score of 702 is at the percentile 97 for subjects of the same age, gender and race/ethnicity who are free of clinical cardiovascular disease and treated diabetes.   No acute or significant extracardiac abnormality  ECG: SB, HR 58-personally reviewed  Recent Labs: 08/02/2022: ALT 22; BUN 31; Creat 1.11; Potassium 5.1; Sodium 132 09/26/2022: TSH 1.49 01/22/2023: Hemoglobin 11.2; Platelets 423.0  Recent Lipid Panel    Component Value Date/Time   CHOL 214 (H) 10/09/2021 0812   TRIG 60 10/09/2021 0812   HDL 139 10/09/2021 0812   CHOLHDL 1.5 10/09/2021 0812   CHOLHDL 1.9 07/19/2014 0903   VLDL 8  07/19/2014 0903   LDLCALC 64 10/09/2021 0812     Physical Exam:    VS:  BP (!) 94/52   Pulse (!) 58   Ht 5\' 2"  (1.575 m)   Wt 118 lb 12.8 oz (53.9 kg)   LMP 09/24/1999   BMI 21.73 kg/m     Wt Readings from Last 3 Encounters:  03/24/23 118 lb 12.8 oz (53.9 kg)  09/26/22 116 lb 4 oz (52.7 kg)  03/29/22 114 lb 3.2 oz (51.8 kg)     GEN:  Well nourished, well developed in no acute distress HEENT: Normal NECK: No JVD; No carotid bruits CARDIAC: RRR, 1/6 systolic murmur RESPIRATORY:  CTAB, no wheezes ABDOMEN: Soft, non-tender, non-distended MUSCULOSKELETAL:  No edema; No deformity  SKIN: Warm and dry NEUROLOGIC:  Alert and oriented x 3 PSYCHIATRIC:  Normal affect   ASSESSMENT:    1. Coronary artery disease involving native coronary artery of native heart without angina pectoris   2. Primary hypertension   3. Tobacco abuse   4. Pure hypercholesterolemia     PLAN:    In order of problems listed above:  #Multivessel CAD: #Coronary calcification: Patient with coronary calcium score 702 (97% for age, gender, race matched controls). Calcium noted in all three coronary arteries consistent with multivessel CAD. No symptoms and patient is very active without limitations. ECG without evidence of ischemia or infarct. Risk factors include tobacco use, HTN, HLD and age. -TTE with normal BiV function, no WMA -Continue ASA 81mg  daily -Continue crestor 20mg  daily -Start zetia 10mg  daily -BP soft today, will decrease coreg back down to 3.125mg  BID -Continue lisinopril 20mg  daily -Patient is motivated to quit smoking again   #HTN: -BP soft today, will decrease coreg back down to 3.125mg  BID -Continue lisinopril 20mg  daily -Monitor at home with goal BP<120s/80s   #HLD: -Continue crestor 20mg  daily -Start zetia 10mg  daily  -LDL 115; do no think she will get to target with crestor and zetia, will refer to pharm D for PCSK9i -Goal LDL<70  #Tobacco use: Had successfully quit but  started back again with stress in her life. She is motivated to quit again. -Continue Nicotine patches   Medication Adjustments/Labs and Tests Ordered: Current medicines are reviewed at length with the patient today.  Concerns regarding medicines are outlined above.  Orders Placed This Encounter  Procedures   AMB Referral to [facility] Pharm-D   EKG 12-Lead   Meds ordered this encounter  Medications   carvedilol (COREG) 3.125 MG tablet    Sig: Take 1 tablet (3.125 mg total) by mouth 2 (two) times daily with a meal.    Dispense:  180 tablet    Refill:  3   ezetimibe (ZETIA) 10 MG tablet    Sig: Take 1 tablet (10 mg total) by mouth daily.    Dispense:  90 tablet    Refill:  3    Patient Instructions  Medication Instructions:  Your physician has recommended you make the following change in your medication:   Decrease Coreg to 3.125 mg 2 times daily  Start Zeta 10 mg daily  *If you need a refill on your cardiac medications before your next appointment, please call your pharmacy*   Follow-Up: At [facility], you and your health needs are our priority.  As part of our continuing mission to provide you with exceptional heart care, we have created designated Provider Care Teams.  These Care Teams include your primary Cardiologist (physician) and Advanced Practice Providers (APPs -  Physician Assistants and Nurse Practitioners) who all work together to provide you with the care you need, when you need it.   Your next appointment:   6 month(s)  Provider:   Dr. Clifton James       Signed, Meriam Sprague, MD  03/24/2023 3:52 PM    [facility]

## 2023-03-24 ENCOUNTER — Ambulatory Visit: Payer: Medicare HMO | Attending: Cardiology | Admitting: Cardiology

## 2023-03-24 ENCOUNTER — Encounter: Payer: Self-pay | Admitting: Cardiology

## 2023-03-24 VITALS — BP 94/52 | HR 58 | Ht 62.0 in | Wt 118.8 lb

## 2023-03-24 DIAGNOSIS — I2510 Atherosclerotic heart disease of native coronary artery without angina pectoris: Secondary | ICD-10-CM

## 2023-03-24 DIAGNOSIS — Z720 Tobacco use: Secondary | ICD-10-CM

## 2023-03-24 DIAGNOSIS — E7800 Pure hypercholesterolemia, unspecified: Secondary | ICD-10-CM

## 2023-03-24 DIAGNOSIS — I10 Essential (primary) hypertension: Secondary | ICD-10-CM

## 2023-03-24 MED ORDER — EZETIMIBE 10 MG PO TABS
10.0000 mg | ORAL_TABLET | Freq: Every day | ORAL | 3 refills | Status: DC
Start: 2023-03-24 — End: 2024-01-08

## 2023-03-24 MED ORDER — CARVEDILOL 3.125 MG PO TABS
3.1250 mg | ORAL_TABLET | Freq: Two times a day (BID) | ORAL | 3 refills | Status: DC
Start: 2023-03-24 — End: 2024-03-22

## 2023-03-24 NOTE — Patient Instructions (Signed)
Medication Instructions:  Your physician has recommended you make the following change in your medication:   Decrease Coreg to 3.125 mg 2 times daily  Start Zeta 10 mg daily  *If you need a refill on your cardiac medications before your next appointment, please call your pharmacy*   Follow-Up: At [facility], you and your health needs are our priority.  As part of our continuing mission to provide you with exceptional heart care, we have created designated Provider Care Teams.  These Care Teams include your primary Cardiologist (physician) and Advanced Practice Providers (APPs -  Physician Assistants and Nurse Practitioners) who all work together to provide you with the care you need, when you need it.   Your next appointment:   6 month(s)  Provider:   Dr. Clifton James

## 2023-04-18 ENCOUNTER — Ambulatory Visit: Payer: Medicare HMO

## 2023-04-28 ENCOUNTER — Telehealth: Payer: Self-pay

## 2023-04-28 NOTE — Telephone Encounter (Signed)
MyChart message sent to patient with lab reminder.  

## 2023-04-28 NOTE — Telephone Encounter (Signed)
Patient reviewed MyChart message. Last read by Oda Cogan at 12:17 PM on 04/28/2023.

## 2023-04-28 NOTE — Telephone Encounter (Signed)
-----   Message from Nurse Flanders P sent at 01/24/2023 12:01 PM EDT ----- Regarding: 49-month lab reminder CBC and B12 level - orders in epic

## 2023-04-29 ENCOUNTER — Ambulatory Visit (INDEPENDENT_AMBULATORY_CARE_PROVIDER_SITE_OTHER): Payer: Medicare HMO | Admitting: Psychiatry

## 2023-04-29 ENCOUNTER — Encounter: Payer: Self-pay | Admitting: Psychiatry

## 2023-04-29 DIAGNOSIS — F331 Major depressive disorder, recurrent, moderate: Secondary | ICD-10-CM

## 2023-04-29 DIAGNOSIS — E871 Hypo-osmolality and hyponatremia: Secondary | ICD-10-CM

## 2023-04-29 DIAGNOSIS — Z79899 Other long term (current) drug therapy: Secondary | ICD-10-CM | POA: Diagnosis not present

## 2023-04-29 DIAGNOSIS — F411 Generalized anxiety disorder: Secondary | ICD-10-CM

## 2023-04-29 DIAGNOSIS — F5105 Insomnia due to other mental disorder: Secondary | ICD-10-CM

## 2023-04-29 MED ORDER — PAROXETINE HCL 10 MG PO TABS: 10.0000 mg | ORAL_TABLET | Freq: Every day | ORAL | 1 refills | Status: DC

## 2023-04-29 MED ORDER — BUPROPION HCL ER (SR) 100 MG PO TB12: 100.0000 mg | ORAL_TABLET | Freq: Every day | ORAL | 1 refills | Status: DC

## 2023-04-29 MED ORDER — LITHIUM CARBONATE 150 MG PO CAPS
ORAL_CAPSULE | ORAL | 0 refills | Status: DC
Start: 2023-04-29 — End: 2023-08-05

## 2023-04-29 MED ORDER — ALPRAZOLAM 0.5 MG PO TABS
0.5000 mg | ORAL_TABLET | Freq: Two times a day (BID) | ORAL | 1 refills | Status: AC | PRN
Start: 2023-04-29 — End: ?

## 2023-04-29 MED ORDER — TRAZODONE HCL 50 MG PO TABS: ORAL_TABLET | ORAL | 0 refills | Status: DC

## 2023-04-29 NOTE — Progress Notes (Signed)
Robin Barton [id number] [birth year] 67 y.o.    Subjective:   Patient ID:  Robin Barton is a 67 y.o. (DOB [birth year]) female.  Chief Complaint:  Chief Complaint  Patient presents with   Follow-up    Mood and sleep    Depression        Associated symptoms include myalgias.  Associated symptoms include no decreased concentration, no fatigue and no suicidal ideas.  Past medical history includes anxiety.   Anxiety Patient reports no confusion, decreased concentration, dizziness, nervous/anxious behavior or suicidal ideas.     Robin Barton presents to the office today for follow-up mood and anxiety.  seen October 2020.  No meds were changed.  She was doing well psychiatrically.  It was recommended she get a lithium level and BMP and Continue paroxetine 20 mg daily, continue trazodone 50 mg nightly, continue lithium 150 mg alternating with 300 mg every other day.  January 17, 2020 appointment the following is noted: .Ok.  Exposed to virus twice and had to QT.  Had to hlep sister with med probvlems.   Forgot the labs.   Gotten vaccinated. Not taking Xanax.   Sleeps Ok with trazodone. Forgot to get labs after reducing the lithium.  Tremor is much better and manageable with the reduction of lithium.  No worsening of mood with the change. Tremor has gotten better on it's own.  No longer needs B6. Plan: Continue paroxetine 20 mg daily, continue trazodone 50 mg nightly, continue lithium 150 mg alternating with 300 mg every other day. Asks for alprazolam prn anxiety.  August 2021 it was discovered she had low sodium and was just recommended she discuss with PCP.  She stopped trazodone and her sodium level improved.  06/26/20 appt noted: Reduced paroxetine to 10 mg from 20 mg daily and stopped trazodone. Low sodium SE caused scary neuro sx.  Had gotten confused and was having tearful episodes.  Sx had developed gradually over time.   Balance issues.   I feel so much  better.    Taking salt tablet twice daily. BP is fine.   Emotionally feels good.  On a cleaning rampage at this time.    Lost a good friend to ALS.   Taking Xanax instead of trazodone for sleep. Plan: No med changes today. Continue lower paroxetine 10 mg daily,  continue lithium 150 mg alternating with 300 mg every other day.  Continue salt tablets Stopped trazodone DT hyponatremia. Asks for alprazolam prn anxiety or insomnia since off the Xanax..  09/26/2020 appointment with following noted: Xanax 0.5 mg HS. Still on salt, paroxetine 10, lithium 300/150 QOD. Asks to take Xanax during the day for anxiety.  Don't seem to have the drive she used to have but once done she feels great.  Some avoidance like social things.  Energy is good. Possibly more anxious at lower dose paroxetine. No crying spells.  H notices reduction in motivation and she doesn't seem like herself. Smokes and needs to stop. Plan: Reduce caffeine bc she gets anxious after taking it and has not reduced as requested. wellbutrin XL 150 mg 1 for 1 week in the AM, then 2 each morning. Repeat serum sodium  11/13/2020 appointment with following noted: Wellbutrin helped me settle down and reduced craving to smoke.  Never got above 150 mg XL.   SE night sweats. Still continues paroxetine 10. Not sleeping as well either with awakening with sweats.  Cut smoking way back to as little as 3-5 per  day with less craving from 1/2 to 1 PPD. Emotionally felt better with better task completion.  Better motivation.  Don't feel as anxious in AM and less coffee in the AM.  Plan: Switch to Wellbutrin SR 100 mg AM to reduce SE.  It's helping.  02/06/2021 appointment with the following noted: Not sleeping well with night sweats awakens..Still gets at least 8 hours but prefers 10 h.  Not drowsy. Wonders if related to Wellbutrin.  Feels she needs 1 mg alprazolam to get best response from it.  No hangover.  Felt sleep better with trazodone but afraid  of repeat hyponatremia.   Some depression.  Feels a little lost and not real motivated but not everyday.  Thinks needs job and schedule.  Good enjoyment but less motivation. Kids don't live here and Robin Barton gone a lot on golf trips. Antsy in the morning after up 10-15 mins and reduced caffeine and it helped. Not anxious all day.   Wellbutrin reduced smoking by 50%.  Don't want to give Wellbutrin up.  No daytime XaNAX.   Plan: Switch to Wellbutrin SR 100 mg AM to reduce SE.  It's helping. Disc smoking cessation.. Asks for alprazolam prn anxiety or insomnia since off the trazodone. Ok increase to 0.75 mg HS Continue lower paroxetine 10 mg daily,  continue lithium 150 mg alternating with 300 mg every other day.   06/05/21 appt noted:   Fair.  Not sleeping well.  EMA and jus t lays there in the dark.  Then feels like crap the next day.  Happens 5 days per week.  Took trazdone other night without Xanax without more help. Had slept well with trazodone. Night sweats after she awakens.  Tried black Kohush intermittently.   Redeuced coffee. Smoking better but hasn't quit.  3-5 per day.   .Still having tremor Some occ dep but not usually.  .  Some anxiety over doing things..    Still feels anxious in AM same pattern..  Drinks coffee.  Never had ADD but anxious about doing things she wants to do but doesn't get done..   Denies appetite disturbance.  Patient reports that energy and motivation have been good.  Patient denies any difficulty with concentration.  Patient denies any suicidal ideation. Plan: Switch to Wellbutrin IR 75 mg AM to reduce insomnia SE.  It's helping. Disc smoking cessation..  10/16/2021 appt noted: Fish pond with goldfish. Still not sleeping very well. Klonopin 0.75 wihtout help. Switched back to Xanax 0.75 mg HS but not as good as trazodone. Sleep adequate hours. But awaken in middle of night thinking about things. Back pain unusually and muscular.  RX Robaxin but didn't take yet.   Has cyclobenzaprine. Sleep doesn't feel restuful. Mood OK not great.  Should exercise. Some anxiety in the AM Plan: Try cyclobenzaprine for sleep and back pain.  She has some Increase Wellbutrin to 100 mg AM  for mood  01/09/22 appt noted: Flexeril didn't work for EMA with worry. Last week restarted trazodone 50 mg HS and it is working. Taking paroxetine 10 and Wellbutrin SR 100 AM.  Tolerated fine.   Energy pretty good. Low sodium 130.  Increased salt tablets to 2 daily.   Some anxiety in the AM. Mood up and down with down lasting 24 hours and seems related to insomnia or not. Trazodone works better for sleep than Xanax.    03/12/2022 appointment with the following noted: Continues Wellbutrin SR 100 mg every morning, cyclobenzaprine 10 mg nightly only  rarely, lithium 300 alternating with 150 every other night, paroxetine 10 mg, trazodone 50 mg nightly Pretty good usually.   PE early June sodium up to 138.  Stopped sodium after that appt.  BC gets stuck in throat. Don't drink enough water.   Reports high liver enzyme apparently minor bc PCP didn't order more testing. Patient reports stable mood and denies depressed or irritable moods.  Patient denies any recent difficulty with anxiety.  Patient denies difficulty with sleep initiation or maintenance. Denies appetite disturbance.  Patient reports that energy and motivation have been good.  Patient denies any difficulty with concentration.  Patient denies any suicidal ideation. Plan: No med changes Continues Wellbutrin SR 100 mg every morning, cyclobenzaprine 10 mg nightly only rarely, lithium 300 alternating with 150 every other night, paroxetine 10 mg, trazodone 50 mg nightly Check CMP  07/16/22 appt noted: Lost paperwork for CMP. Can't swallow B6 or salt tablets.  Tried cutting both in half.  They get hung in throat. Otherwise doing great.  Patient reports stable mood and denies depressed or irritable moods.  Patient denies any recent  difficulty with anxiety.  Patient denies difficulty with sleep initiation but EMA about 4 AM often.  Denies appetite disturbance.  Patient reports that energy and motivation have been good.  Patient denies any difficulty with concentration.  Patient denies any suicidal ideation. Sleep better with Xanax than trazodone. Not using alprazolam for anxiety. Plan: No med changes except sleeper: Continues Wellbutrin SR 100 mg every morning, cyclobenzaprine 10 mg nightly only rarely, lithium 300 alternating with 150 every other night, paroxetine 10 mg,  DT low sodium try stopping trazodone and use alprazolam prn  11/13/22 appt noted: Avg 8 hours.  On trazodone. Not depressed .  Some days feels bored.  Not much on schedule to occupy her.  Does help some older women in neighborhood.  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 Needs to start exercise.. First GS in [place].  Don't get to see them much.  Born in Sept.  No SE issues with meds.   Watching coffee intake. No xanax lately.  04/29/23 appt noted:  Mood about the same.  Energy varies and sometimes blah but not always. Low B12 and just started supplement. No consistent anxiety but some mornings worries.  Other mornings are oK. Sleep is fair.  Awakening and then trouble going back to sleep Typical worries with son with baby.  Son unhappy with job.  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     Past Psychiatric Medication Trials: paroxetine, several intolerant SSRI's Wellbutrin SR 100 ? ins lithium Xanax,  trazodone low sodium  History of Neuro eval for tremor.  Review of Systems:  Review of Systems  Constitutional:  Negative for fatigue.  Musculoskeletal:  Positive for back pain and myalgias.  Neurological:  Negative for dizziness, tremors, weakness and light-headedness.  Psychiatric/Behavioral:  Negative for agitation, behavioral problems, confusion, decreased concentration, dysphoric mood, hallucinations, self-injury, sleep disturbance  and suicidal ideas. The patient is not nervous/anxious and is not hyperactive.     Medications: I have reviewed the patient's current medications.  Current Outpatient Medications  Medication Sig Dispense Refill   aspirin EC 81 MG tablet Take 81 mg by mouth daily. Swallow whole.     carvedilol (COREG) 3.125 MG tablet Take 1 tablet (3.125 mg total) by mouth 2 (two) times daily with a meal. 180 tablet 3   Cholecalciferol (VITAMIN D3) 1.25 MG (50000 UT) CAPS every 14 (fourteen) days.  cyanocobalamin (VITAMIN B12) 1000 MCG/ML injection Inject 1 mL (1,000 mcg total) into the muscle once a week. (Patient taking differently: Inject 1,000 mcg into the muscle once a week. For 4 weeks) 4 mL 0   cyanocobalamin (VITAMIN B12) 1000 MCG/ML injection Inject 1 mL (1,000 mcg total) into the muscle every 30 (thirty) days. 6 mL 0   ezetimibe (ZETIA) 10 MG tablet Take 1 tablet (10 mg total) by mouth daily. 90 tablet 3   lisinopril (ZESTRIL) 40 MG tablet Take 1 tablet (40 mg total) by mouth daily. (Patient taking differently: Take 20 mg by mouth daily.) 90 tablet 3   loperamide (IMODIUM A-D) 2 MG tablet Take 1 tablet (2 mg total) by mouth as needed for diarrhea or loose stools. 30 tablet 0   magnesium oxide (MAG-OX) 400 MG tablet Take 400 mg by mouth daily.     nicotine (NICODERM CQ) 14 mg/24hr patch Place 1 patch (14 mg total) onto the skin daily. 30 patch 3   Pyridoxine HCl (VITAMIN B-6) 500 MG tablet Take 500 mg by mouth as needed.      rosuvastatin (CRESTOR) 20 MG tablet Take 1 tablet (20 mg total) by mouth daily. 90 tablet 3   SODIUM CHLORIDE PO Take by mouth in the morning and at bedtime.     ALPRAZolam (XANAX) 0.5 MG tablet Take 1 tablet (0.5 mg total) by mouth 2 (two) times daily as needed for anxiety. 45 tablet 1   buPROPion ER (WELLBUTRIN SR) 100 MG 12 hr tablet Take 1 tablet (100 mg total) by mouth daily. 90 tablet 1   lithium carbonate 150 MG capsule ALTERNATE TAKING 2 CAPSULES BY MOUTH WITH 1 CAPSULE  EVERY OTHER NIGHT 135 capsule 0   PARoxetine (PAXIL) 10 MG tablet Take 1 tablet (10 mg total) by mouth daily. 90 tablet 1   traZODone (DESYREL) 50 MG tablet 1-2 tablets at night as needed for sleep 135 tablet 0   No current facility-administered medications for this visit.    Medication Side Effects: Other: tremor recently better  Allergies:  Allergies  Allergen Reactions   Sulfonamide Derivatives Hives, Itching and Rash    Past Medical History:  Diagnosis Date   Acid reflux    Anxiety    Atypical lobular hyperplasia of left breast 11/25/13   lumpectomy, 12/10/13   BCC (basal cell carcinoma)    per pt, never had cancer   Depression    Hyperlipidemia    Hypertension    Osteopenia    Thyroid disease 2010   rt thyroid nodule    Family History  Problem Relation Age of Onset   Cancer Mother        colon-stage 4   Hypertension Mother    Colon cancer Mother    Other Father    Breast cancer Sister    Colon polyps Neg Hx    Esophageal cancer Neg Hx    Rectal cancer Neg Hx    Stomach cancer Neg Hx     Social History   Socioeconomic History   Marital status: Married    Spouse name: Not on file   Number of children: 2   Years of education: Not on file   Highest education level: Not on file  Occupational History   Not on file  Tobacco Use   Smoking status: Some Days    Current packs/day: 0.50    Average packs/day: 0.5 packs/day for 35.0 years (17.5 ttl pk-yrs)    Types: Cigarettes   Smokeless  tobacco: Never  Vaping Use   Vaping status: Former  Substance and Sexual Activity   Alcohol use: Yes    Alcohol/week: 12.0 - 14.0 standard drinks of alcohol    Types: 12 - 14 Glasses of wine per week   Drug use: No   Sexual activity: Yes    Partners: Male    Birth control/protection: Post-menopausal  Other Topics Concern   Not on file  Social History Narrative   Not on file   Social Determinants of Health   Financial Resource Strain: Not on file  Food Insecurity:  Not on file  Transportation Needs: Not on file  Physical Activity: Not on file  Stress: Not on file  Social Connections: Not on file  Intimate Partner Violence: Not on file    Past Medical History, Surgical history, Social history, and Family history were reviewed and updated as appropriate.   Please see review of systems for further details on the patient's review from today.   Objective:   Physical Exam:  LMP 09/24/1999   Physical Exam Constitutional:      General: She is not in acute distress.    Appearance: She is well-developed.  Musculoskeletal:        General: No deformity.  Neurological:     Mental Status: She is alert and oriented to person, place, and time.     Cranial Nerves: No dysarthria.     Coordination: Coordination normal.  Psychiatric:        Attention and Perception: Attention and perception normal. She does not perceive auditory or visual hallucinations.        Mood and Affect: Mood is anxious. Mood is not depressed. Affect is not labile, angry or inappropriate.        Speech: Speech normal. Speech is not rapid and pressured or slurred.        Behavior: Behavior normal. Behavior is cooperative.        Thought Content: Thought content normal. Thought content is not paranoid or delusional. Thought content does not include homicidal or suicidal ideation. Thought content does not include suicidal plan.        Cognition and Memory: Cognition and memory normal.        Judgment: Judgment normal.     Comments: Insight good No evidence for cognitive issues. Minimal anxiety and better than the past.      Lab Review:     Component Value Date/Time   NA 132 (L) 08/02/2022 1447   NA 139 07/19/2014 0903   K 5.1 08/02/2022 1447   K 4.7 07/19/2014 0903   CL 99 08/02/2022 1447   CO2 24 08/02/2022 1447   CO2 24 07/19/2014 0903   GLUCOSE 79 08/02/2022 1447   GLUCOSE 95 07/19/2014 0903   BUN 31 (H) 08/02/2022 1447   BUN 22.8 07/19/2014 0903   CREATININE 1.11 (H)  08/02/2022 1447   CREATININE 0.8 07/19/2014 0903   CALCIUM 9.6 08/02/2022 1447   CALCIUM 9.6 07/19/2014 0903   PROT 7.0 08/02/2022 1447   PROT 7.3 07/19/2014 0903   ALBUMIN 4.1 07/19/2014 0903   AST 29 08/02/2022 1447   AST 23 07/19/2014 0903   ALT 22 08/02/2022 1447   ALT 21 07/19/2014 0903   ALKPHOS 54 07/19/2014 0903   BILITOT 0.5 08/02/2022 1447   BILITOT 0.49 07/19/2014 0903   GFRNONAA 53 (L) 06/18/2018 0934   GFRAA 61 06/18/2018 0934       Component Value Date/Time   WBC 7.4 01/22/2023 1457  RBC 3.23 (L) 01/22/2023 1457   HGB 11.2 (L) 01/22/2023 1457   HGB 12.5 07/19/2014 0903   HCT 32.8 (L) 01/22/2023 1457   HCT 37.8 07/19/2014 0903   PLT 423.0 (H) 01/22/2023 1457   PLT 370 07/19/2014 0903   MCV 101.4 (H) 01/22/2023 1457   MCV 96.0 07/19/2014 0903   MCH 31.8 07/19/2014 0903   MCH 31.9 04/05/2011 0923   MCHC 34.2 01/22/2023 1457   RDW 12.8 01/22/2023 1457   RDW 12.8 07/19/2014 0903   LYMPHSABS 1.5 01/22/2023 1457   LYMPHSABS 1.5 07/19/2014 0903   MONOABS 0.9 01/22/2023 1457   MONOABS 0.6 07/19/2014 0903   EOSABS 0.2 01/22/2023 1457   EOSABS 0.1 07/19/2014 0903   BASOSABS 0.1 01/22/2023 1457   BASOSABS 0.0 07/19/2014 0903    Lithium Lvl  Date Value Ref Range Status  08/02/2022 0.5 (L) 0.6 - 1.2 mmol/L Final     No results found for: "PHENYTOIN", "PHENOBARB", "VALPROATE", "CBMZ"   .res Assessment: Plan:    Major depressive disorder, recurrent episode, moderate (HCC) - Plan: Lithium level, PARoxetine (PAXIL) 10 MG tablet, lithium carbonate 150 MG capsule, buPROPion ER (WELLBUTRIN SR) 100 MG 12 hr tablet  Generalized anxiety disorder - Plan: PARoxetine (PAXIL) 10 MG tablet, ALPRAZolam (XANAX) 0.5 MG tablet  Insomnia due to mental condition - Plan: traZODone (DESYREL) 50 MG tablet, ALPRAZolam (XANAX) 0.5 MG tablet  Hyponatremia - Plan: Lithium level  Lithium use - Plan: Lithium level  Please see After Visit Summary for patient specific  instructions.  Overall she remained stable after the reduction in both lithium and paroxetine to current dosages.  Better motivation and task completion and less msoking with Wellbutrin. To help mood will focus on sleep problems.  We discussed her history of response to lithium at the lower dose 150 and then the 300 mg a day.  We discussed her side effects of tremor.  It's better now.   The tremor is better with the reduction to the current dose of lithium and the use of B6.  It is now manageable.  Disc option stopping B6 to see if she still needs it. Last lithium level is low normal.  We discussed the question of her diagnosis given at prior history of 2 weeks of hypomania but that may have been related to medications.  The uncertainty whether she truly has major depression versus bipolar disorder remains.  We discussed the risk of antidepressants causing mood cycling and the advantage to being on a lower dose.  Answered questions about each med and DDI and risks to liver and kidney  Continue lower paroxetine 10 mg daily,    continue lithium 150 mg alternating with 300 mg every other day.  Counseled patient regarding potential benefits, risks, and side effects of lithium to include potential risk of lithium affecting thyroid and renal function.  Discussed need for periodic lab monitoring to determine drug level and to assess for potential adverse effects.  Counseled patient regarding signs and symptoms of lithium toxicity and advised that they notify office immediately or seek urgent medical attention if experiencing these signs and symptoms.  Patient advised to contact office with any questions or concerns. B6 helped lithium tremor  Trial black cohush.  continue Wellbutrin to 100 mg AM  for mood.  Consider increase if needed. We discussed the short-term risks associated with benzodiazepines including sedation and increased fall risk among others.  Discussed long-term side effect risk including  dependence, potential withdrawal symptoms, and the potential  eventual dose-related risk of dementia.  But recent studies from 2020 dispute this association between benzodiazepines and dementia risk. Newer studies in 2020 do not support an association with dementia. Disc SE worsen with age.  Disc smoking cessation.. Watch caffeine for tremor.  She has done this.  No med changes except sleeper: Continues Wellbutrin SR 100 mg every morning,  cyclobenzaprine 10 mg nightly only rarely,  lithium 300 alternating with 150 every other night,  paroxetine 10 mg,  trazodone 50 mg HS, sodium tablet daily, option increase  Hx low sodium Last sodium 135 in Jan 2024 Disc risks and continue to be attentive  consider Abilify potentiation  Follow-up 4-6 mos  Meredith Staggers MD, DFAPA\  Future Appointments  Date Time Provider Department Center  05/05/2023  1:30 PM [id number]     Orders Placed This Encounter  Procedures   Lithium level      -------------------------------

## 2023-05-05 ENCOUNTER — Ambulatory Visit: Payer: Medicare HMO | Admitting: Pharmacist

## 2023-05-05 DIAGNOSIS — E7800 Pure hypercholesterolemia, unspecified: Secondary | ICD-10-CM | POA: Diagnosis not present

## 2023-05-05 DIAGNOSIS — I10 Essential (primary) hypertension: Secondary | ICD-10-CM | POA: Diagnosis not present

## 2023-05-05 NOTE — Assessment & Plan Note (Addendum)
Assessment: Patient reports muscle cramps with statins. States they have always bothered her No labs since starting zetia Exercises twice a week, plans to increase this Working on quitting smoking Reviewed options for lowering LDL cholesterol, including PCSK-9 inhibitors. Discussed mechanisms of action, dosing, side effects and potential decreases in LDL cholesterol.  Also reviewed cost information.  Plan: Check direct LDL-C today Submit PA for Repatha once labs result Work on quitting smoking and cutting back on ETOH We discussed options to help her quit Increase exercise Decrease rosuvastatin to 10mg  to see if this helps with cramps

## 2023-05-05 NOTE — Patient Instructions (Addendum)
Please decrease rosuvastatin to 10mg  daily  I will submit a prior authorization for Repatha. I will call you once I hear back. Please call me at [phone] with any questions.   Repatha is a cholesterol medication that improved your body's ability to get rid of "bad cholesterol" known as LDL. It can lower your LDL up to 60%! It is an injection that is given under the skin every 2 weeks. The medication often requires a prior authorization from your insurance company. We will take care of submitting all the necessary information to your insurance company to get it approved. The most common side effects of Repatha include runny nose, symptoms of the common cold, rarely flu or flu-like symptoms, back/muscle pain in about 3-4% of the patients, and redness, pain, or bruising at the injection site. Tell your healthcare provider if you have any side effect that bothers you or that does not go away.     Bmp\

## 2023-05-05 NOTE — Progress Notes (Addendum)
Patient ID: [name]                 DOB: March 02, 1956                    MRN: [id number]      HPI: Robin Barton is a 67 y.o. female patient referred to lipid clinic by Dr. Shari Barton. PMH is significant for GERD, anxiety, depression, HLD, HTN and elevated coronary calcium score 720 97% for age, gender, race matched controls. On rosuvastatin 20mg . LDL9-C in June was 115, in Jan 2023 LDL-C was 64. She was started on ezetimibe.   Patient presents today to lipid clinic. States she has been taking medication for cholesterol for 20 years. She reports that she gets muscle cramps. She has been taking rosuvastatin and zetia. Labs in Jan were on these medications. She is smoking 3-4 cigarettes per day. Wants to quit. Has quit in the past with nicotine gum. Drinks about 2-3 glasses of wine per night. Knows its more than recommended.   Reviewed options for lowering LDL cholesterol, including PCSK-9 inhibitors. Discussed mechanisms of action, dosing, side effects and potential decreases in LDL cholesterol.  Also reviewed cost information.  Current Medications: rosuvastatin 20mg  daily, ezetimibe 10mg  daily Intolerances:  Risk Factors: CAC 720, HTN, tobacco use LDL-C goal: <70  Diet: salads, chicken, ham, tuna  Pork chops, chicken breast, green vegetables, squash Uses olive oil Breakfast: hard boiled egg and/or yogurt Wheat bread- 1 slice per day Drink: water, wine (2-3 glasses per night), un-sweet tea  Exercise: class at [facility]- stretching, balance, weight lifting, aerobics, squats (2 days a week), previously walking  Family History:  Family History  Problem Relation Age of Onset   Cancer Mother        colon-stage 4   Hypertension Mother    Colon cancer Mother    Other Father    Breast cancer Sister    Colon polyps Neg Hx    Esophageal cancer Neg Hx    Rectal cancer Neg Hx    Stomach cancer Neg Hx    Social History: 2-3 glasses of wine per night, 3-4 cigarettes per  day  Labs: Lipid Panel  03/11/23 TC 210, HDL 87, LDL-C 115, non HDL 123 TG 42    Component Value Date/Time   CHOL 214 (H) 10/09/2021 0812   TRIG 60 10/09/2021 0812   HDL 139 10/09/2021 0812   CHOLHDL 1.5 10/09/2021 0812   CHOLHDL 1.9 07/19/2014 0903   VLDL 8 07/19/2014 0903   LDLCALC 64 10/09/2021 0812   LABVLDL 11 10/09/2021 0812    Past Medical History:  Diagnosis Date   Acid reflux    Anxiety    Atypical lobular hyperplasia of left breast 11/25/13   lumpectomy, 12/10/13   BCC (basal cell carcinoma)    per pt, never had cancer   Depression    Hyperlipidemia    Hypertension    Osteopenia    Thyroid disease 2010   rt thyroid nodule    Current Outpatient Medications on File Prior to Visit  Medication Sig Dispense Refill   aspirin EC 81 MG tablet Take 81 mg by mouth daily. Swallow whole.     buPROPion ER (WELLBUTRIN SR) 100 MG 12 hr tablet Take 1 tablet (100 mg total) by mouth daily. 90 tablet 1   carvedilol (COREG) 3.125 MG tablet Take 1 tablet (3.125 mg total) by mouth 2 (two) times daily with a meal. 180 tablet 3   Cholecalciferol (  VITAMIN D3) 1.25 MG (50000 UT) CAPS every 14 (fourteen) days.      cyanocobalamin (VITAMIN B12) 1000 MCG/ML injection Inject 1 mL (1,000 mcg total) into the muscle every 30 (thirty) days. 6 mL 0   ezetimibe (ZETIA) 10 MG tablet Take 1 tablet (10 mg total) by mouth daily. 90 tablet 3   lisinopril (ZESTRIL) 40 MG tablet Take 1 tablet (40 mg total) by mouth daily. (Patient taking differently: Take 20 mg by mouth daily.) 90 tablet 3   lithium carbonate 150 MG capsule ALTERNATE TAKING 2 CAPSULES BY MOUTH WITH 1 CAPSULE EVERY OTHER NIGHT 135 capsule 0   PARoxetine (PAXIL) 10 MG tablet Take 1 tablet (10 mg total) by mouth daily. 90 tablet 1   SODIUM CHLORIDE PO Take by mouth in the morning and at bedtime.     traZODone (DESYREL) 50 MG tablet 1-2 tablets at night as needed for sleep 135 tablet 0   ALPRAZolam (XANAX) 0.5 MG tablet Take 1 tablet (0.5 mg  total) by mouth 2 (two) times daily as needed for anxiety. 45 tablet 1   cyanocobalamin (VITAMIN B12) 1000 MCG/ML injection Inject 1 mL (1,000 mcg total) into the muscle once a week. (Patient taking differently: Inject 1,000 mcg into the muscle once a week. For 4 weeks) 4 mL 0   loperamide (IMODIUM A-D) 2 MG tablet Take 1 tablet (2 mg total) by mouth as needed for diarrhea or loose stools. 30 tablet 0   magnesium oxide (MAG-OX) 400 MG tablet Take 400 mg by mouth daily. (Patient not taking: Reported on 05/05/2023)     nicotine (NICODERM CQ) 14 mg/24hr patch Place 1 patch (14 mg total) onto the skin daily. (Patient not taking: Reported on 05/05/2023) 30 patch 3   Pyridoxine HCl (VITAMIN B-6) 500 MG tablet Take 500 mg by mouth as needed.  (Patient not taking: Reported on 05/05/2023)     rosuvastatin (CRESTOR) 20 MG tablet Take 0.5 tablets (10 mg total) by mouth daily.     No current facility-administered medications on file prior to visit.    Allergies  Allergen Reactions   Sulfonamide Derivatives Hives, Itching and Rash    Assessment/Plan:  1. Hyperlipidemia -  Hyperlipidemia Assessment: Patient reports muscle cramps with statins. States they have always bothered her No labs since starting zetia Exercises twice a week, plans to increase this Working on quitting smoking Reviewed options for lowering LDL cholesterol, including PCSK-9 inhibitors. Discussed mechanisms of action, dosing, side effects and potential decreases in LDL cholesterol.  Also reviewed cost information.  Plan: Check direct LDL-C today Submit PA for Repatha once labs result Work on quitting smoking and cutting back on ETOH We discussed options to help her quit Increase exercise Decrease rosuvastatin to 10mg  to see if this helps with cramps   Thank you,  Robin Barton, Pharm.D, BCACP, BCPS, CPP [facility] of [facility] [address], [place], [place] [address]  Phone:  [phone]; Fax: [phone]

## 2023-05-06 ENCOUNTER — Telehealth: Payer: Self-pay | Admitting: Pharmacist

## 2023-05-06 LAB — LDL CHOLESTEROL, DIRECT: LDL Direct: 51 mg/dL (ref 0–99)

## 2023-05-06 NOTE — Telephone Encounter (Signed)
Spoke with patient. LDL-C at goal. No need for PCSK9i. Pt will have lipid panel done in Sept at PCP since we decreased her rosuvastatin dose.

## 2023-05-14 DIAGNOSIS — Z1231 Encounter for screening mammogram for malignant neoplasm of breast: Secondary | ICD-10-CM | POA: Diagnosis not present

## 2023-05-16 ENCOUNTER — Other Ambulatory Visit: Payer: Self-pay

## 2023-05-16 MED ORDER — ROSUVASTATIN CALCIUM 20 MG PO TABS: 10.0000 mg | ORAL_TABLET | Freq: Every day | ORAL | 3 refills | Status: DC

## 2023-05-16 MED ORDER — LISINOPRIL 40 MG PO TABS: 40.0000 mg | ORAL_TABLET | Freq: Every day | ORAL | 3 refills | Status: DC

## 2023-05-19 DIAGNOSIS — F330 Major depressive disorder, recurrent, mild: Secondary | ICD-10-CM | POA: Diagnosis not present

## 2023-05-19 DIAGNOSIS — E785 Hyperlipidemia, unspecified: Secondary | ICD-10-CM | POA: Diagnosis not present

## 2023-05-19 DIAGNOSIS — R198 Other specified symptoms and signs involving the digestive system and abdomen: Secondary | ICD-10-CM | POA: Diagnosis not present

## 2023-05-19 DIAGNOSIS — I129 Hypertensive chronic kidney disease with stage 1 through stage 4 chronic kidney disease, or unspecified chronic kidney disease: Secondary | ICD-10-CM | POA: Diagnosis not present

## 2023-05-19 DIAGNOSIS — N1831 Chronic kidney disease, stage 3a: Secondary | ICD-10-CM | POA: Diagnosis not present

## 2023-05-19 DIAGNOSIS — I2584 Coronary atherosclerosis due to calcified coronary lesion: Secondary | ICD-10-CM | POA: Diagnosis not present

## 2023-05-19 DIAGNOSIS — E538 Deficiency of other specified B group vitamins: Secondary | ICD-10-CM | POA: Diagnosis not present

## 2023-05-20 ENCOUNTER — Encounter (HOSPITAL_BASED_OUTPATIENT_CLINIC_OR_DEPARTMENT_OTHER): Payer: Self-pay | Admitting: *Deleted

## 2023-07-08 ENCOUNTER — Other Ambulatory Visit: Payer: Self-pay | Admitting: Psychiatry

## 2023-07-08 DIAGNOSIS — M2550 Pain in unspecified joint: Secondary | ICD-10-CM | POA: Diagnosis not present

## 2023-07-08 DIAGNOSIS — M8580 Other specified disorders of bone density and structure, unspecified site: Secondary | ICD-10-CM | POA: Diagnosis not present

## 2023-07-08 DIAGNOSIS — M51369 Other intervertebral disc degeneration, lumbar region without mention of lumbar back pain or lower extremity pain: Secondary | ICD-10-CM | POA: Diagnosis not present

## 2023-07-08 DIAGNOSIS — M25552 Pain in left hip: Secondary | ICD-10-CM | POA: Diagnosis not present

## 2023-07-08 DIAGNOSIS — F5105 Insomnia due to other mental disorder: Secondary | ICD-10-CM

## 2023-07-08 DIAGNOSIS — D0502 Lobular carcinoma in situ of left breast: Secondary | ICD-10-CM | POA: Diagnosis not present

## 2023-07-08 DIAGNOSIS — M5416 Radiculopathy, lumbar region: Secondary | ICD-10-CM | POA: Diagnosis not present

## 2023-07-08 DIAGNOSIS — D369 Benign neoplasm, unspecified site: Secondary | ICD-10-CM | POA: Diagnosis not present

## 2023-07-08 DIAGNOSIS — F17200 Nicotine dependence, unspecified, uncomplicated: Secondary | ICD-10-CM | POA: Diagnosis not present

## 2023-07-08 DIAGNOSIS — Z800 Family history of malignant neoplasm of digestive organs: Secondary | ICD-10-CM | POA: Diagnosis not present

## 2023-07-08 NOTE — Telephone Encounter (Signed)
Rf approp. Lv 08/06; lf 08/06; nv 10/29/23; keeping disp the same dt having enough until next appt. With 0 rf's.

## 2023-07-22 DIAGNOSIS — H52223 Regular astigmatism, bilateral: Secondary | ICD-10-CM | POA: Diagnosis not present

## 2023-07-22 DIAGNOSIS — H5203 Hypermetropia, bilateral: Secondary | ICD-10-CM | POA: Diagnosis not present

## 2023-07-22 DIAGNOSIS — H524 Presbyopia: Secondary | ICD-10-CM | POA: Diagnosis not present

## 2023-08-02 ENCOUNTER — Other Ambulatory Visit: Payer: Self-pay | Admitting: Psychiatry

## 2023-08-02 DIAGNOSIS — F331 Major depressive disorder, recurrent, moderate: Secondary | ICD-10-CM

## 2023-08-04 DIAGNOSIS — M25552 Pain in left hip: Secondary | ICD-10-CM | POA: Diagnosis not present

## 2023-08-04 NOTE — Telephone Encounter (Signed)
Lf 11/3; changed the start date to 12/1

## 2023-08-21 DIAGNOSIS — S62102A Fracture of unspecified carpal bone, left wrist, initial encounter for closed fracture: Secondary | ICD-10-CM | POA: Diagnosis not present

## 2023-08-21 DIAGNOSIS — W19XXXA Unspecified fall, initial encounter: Secondary | ICD-10-CM | POA: Diagnosis not present

## 2023-08-27 DIAGNOSIS — S52552A Other extraarticular fracture of lower end of left radius, initial encounter for closed fracture: Secondary | ICD-10-CM | POA: Diagnosis not present

## 2023-08-27 DIAGNOSIS — M25532 Pain in left wrist: Secondary | ICD-10-CM | POA: Diagnosis not present

## 2023-08-27 DIAGNOSIS — S52502D Unspecified fracture of the lower end of left radius, subsequent encounter for closed fracture with routine healing: Secondary | ICD-10-CM | POA: Diagnosis not present

## 2023-09-03 DIAGNOSIS — M25532 Pain in left wrist: Secondary | ICD-10-CM | POA: Diagnosis not present

## 2023-09-03 DIAGNOSIS — S52502D Unspecified fracture of the lower end of left radius, subsequent encounter for closed fracture with routine healing: Secondary | ICD-10-CM | POA: Diagnosis not present

## 2023-09-04 ENCOUNTER — Ambulatory Visit: Payer: Medicare HMO | Admitting: Psychiatry

## 2023-09-04 DIAGNOSIS — F411 Generalized anxiety disorder: Secondary | ICD-10-CM

## 2023-09-04 DIAGNOSIS — F5105 Insomnia due to other mental disorder: Secondary | ICD-10-CM | POA: Diagnosis not present

## 2023-09-04 DIAGNOSIS — F10129 Alcohol abuse with intoxication, unspecified: Secondary | ICD-10-CM

## 2023-09-04 DIAGNOSIS — F331 Major depressive disorder, recurrent, moderate: Secondary | ICD-10-CM | POA: Diagnosis not present

## 2023-09-04 NOTE — Progress Notes (Signed)
[name] [id number] 02/13/1956 67 y.o.    Subjective:   Patient ID:  Robin Barton is a 67 y.o. (DOB [birth year]) female.  Chief Complaint:  Chief Complaint  Patient presents with   Follow-up   Depression   Anxiety   Addiction Problem   Sleeping Problem    Depression        Associated symptoms include myalgias.  Associated symptoms include no decreased concentration, no fatigue and no suicidal ideas.  Past medical history includes anxiety.   Anxiety Patient reports no confusion, decreased concentration, dizziness, nervous/anxious behavior or suicidal ideas.     [name] presents to the office today for follow-up mood and anxiety.  seen October 2020.  No meds were changed.  She was doing well psychiatrically.  It was recommended she get a lithium level and BMP and Continue paroxetine 20 mg daily, continue trazodone 50 mg nightly, continue lithium 150 mg alternating with 300 mg every other day.  January 17, 2020 appointment the following is noted: .Ok.  Exposed to virus twice and had to QT.  Had to hlep sister with med probvlems.   Forgot the labs.   Gotten vaccinated. Not taking Xanax.   Sleeps Ok with trazodone. Forgot to get labs after reducing the lithium.  Tremor is much better and manageable with the reduction of lithium.  No worsening of mood with the change. Tremor has gotten better on it's own.  No longer needs B6. Plan: Continue paroxetine 20 mg daily, continue trazodone 50 mg nightly, continue lithium 150 mg alternating with 300 mg every other day. Asks for alprazolam prn anxiety.  August 2021 it was discovered she had low sodium and was just recommended she discuss with PCP.  She stopped trazodone and her sodium level improved.  06/26/20 appt noted: Reduced paroxetine to 10 mg from 20 mg daily and stopped trazodone. Low sodium SE caused scary neuro sx.  Had gotten confused and was having tearful episodes.  Sx had developed gradually  over time.   Balance issues.   I feel so much better.    Taking salt tablet twice daily. BP is fine.   Emotionally feels good.  On a cleaning rampage at this time.    Lost a good friend to ALS.   Taking Xanax instead of trazodone for sleep. Plan: No med changes today. Continue lower paroxetine 10 mg daily,  continue lithium 150 mg alternating with 300 mg every other day.  Continue salt tablets Stopped trazodone DT hyponatremia. Asks for alprazolam prn anxiety or insomnia since off the Xanax..  09/26/2020 appointment with following noted: Xanax 0.5 mg HS. Still on salt, paroxetine 10, lithium 300/150 QOD. Asks to take Xanax during the day for anxiety.  Don't seem to have the drive she used to have but once done she feels great.  Some avoidance like social things.  Energy is good. Possibly more anxious at lower dose paroxetine. No crying spells.  H notices reduction in motivation and she doesn't seem like herself. Smokes and needs to stop. Plan: Reduce caffeine bc she gets anxious after taking it and has not reduced as requested. wellbutrin XL 150 mg 1 for 1 week in the AM, then 2 each morning. Repeat serum sodium  11/13/2020 appointment with following noted: Wellbutrin helped me settle down and reduced craving to smoke.  Never got above 150 mg XL.   SE night sweats. Still continues paroxetine 10. Not sleeping as well either with awakening with sweats.  Cut smoking  way back to as little as 3-5 per day with less craving from 1/2 to 1 PPD. Emotionally felt better with better task completion.  Better motivation.  Don't feel as anxious in AM and less coffee in the AM.  Plan: Switch to Wellbutrin SR 100 mg AM to reduce SE.  It's helping.  02/06/2021 appointment with the following noted: Not sleeping well with night sweats awakens..Still gets at least 8 hours but prefers 10 h.  Not drowsy. Wonders if related to Wellbutrin.  Feels she needs 1 mg alprazolam to get best response from it.  No  hangover.  Felt sleep better with trazodone but afraid of repeat hyponatremia.   Some depression.  Feels a little lost and not real motivated but not everyday.  Thinks needs job and schedule.  Good enjoyment but less motivation. Kids don't live here and Larence Penning gone a lot on golf trips. Antsy in the morning after up 10-15 mins and reduced caffeine and it helped. Not anxious all day.   Wellbutrin reduced smoking by 50%.  Don't want to give Wellbutrin up.  No daytime XaNAX.   Plan: Switch to Wellbutrin SR 100 mg AM to reduce SE.  It's helping. Disc smoking cessation.. Asks for alprazolam prn anxiety or insomnia since off the trazodone. Ok increase to 0.75 mg HS Continue lower paroxetine 10 mg daily,  continue lithium 150 mg alternating with 300 mg every other day.   06/05/21 appt noted:   Fair.  Not sleeping well.  EMA and jus t lays there in the dark.  Then feels like crap the next day.  Happens 5 days per week.  Took trazdone other night without Xanax without more help. Had slept well with trazodone. Night sweats after she awakens.  Tried black Kohush intermittently.   Redeuced coffee. Smoking better but hasn't quit.  3-5 per day.   .Still having tremor Some occ dep but not usually.  .  Some anxiety over doing things..    Still feels anxious in AM same pattern..  Drinks coffee.  Never had ADD but anxious about doing things she wants to do but doesn't get done..   Denies appetite disturbance.  Patient reports that energy and motivation have been good.  Patient denies any difficulty with concentration.  Patient denies any suicidal ideation. Plan: Switch to Wellbutrin IR 75 mg AM to reduce insomnia SE.  It's helping. Disc smoking cessation..  10/16/2021 appt noted: Fish pond with goldfish. Still not sleeping very well. Klonopin 0.75 wihtout help. Switched back to Xanax 0.75 mg HS but not as good as trazodone. Sleep adequate hours. But awaken in middle of night thinking about things. Back pain  unusually and muscular.  RX Robaxin but didn't take yet.  Has cyclobenzaprine. Sleep doesn't feel restuful. Mood OK not great.  Should exercise. Some anxiety in the AM Plan: Try cyclobenzaprine for sleep and back pain.  She has some Increase Wellbutrin to 100 mg AM  for mood  01/09/22 appt noted: Flexeril didn't work for EMA with worry. Last week restarted trazodone 50 mg HS and it is working. Taking paroxetine 10 and Wellbutrin SR 100 AM.  Tolerated fine.   Energy pretty good. Low sodium 130.  Increased salt tablets to 2 daily.   Some anxiety in the AM. Mood up and down with down lasting 24 hours and seems related to insomnia or not. Trazodone works better for sleep than Xanax.    03/12/2022 appointment with the following noted: Continues Wellbutrin SR 100  mg every morning, cyclobenzaprine 10 mg nightly only rarely, lithium 300 alternating with 150 every other night, paroxetine 10 mg, trazodone 50 mg nightly Pretty good usually.   PE early June sodium up to 138.  Stopped sodium after that appt.  BC gets stuck in throat. Don't drink enough water.   Reports high liver enzyme apparently minor bc PCP didn't order more testing. Patient reports stable mood and denies depressed or irritable moods.  Patient denies any recent difficulty with anxiety.  Patient denies difficulty with sleep initiation or maintenance. Denies appetite disturbance.  Patient reports that energy and motivation have been good.  Patient denies any difficulty with concentration.  Patient denies any suicidal ideation. Plan: No med changes Continues Wellbutrin SR 100 mg every morning, cyclobenzaprine 10 mg nightly only rarely, lithium 300 alternating with 150 every other night, paroxetine 10 mg, trazodone 50 mg nightly Check CMP  07/16/22 appt noted: Lost paperwork for CMP. Can't swallow B6 or salt tablets.  Tried cutting both in half.  They get hung in throat. Otherwise doing great.  Patient reports stable mood and denies  depressed or irritable moods.  Patient denies any recent difficulty with anxiety.  Patient denies difficulty with sleep initiation but EMA about 4 AM often.  Denies appetite disturbance.  Patient reports that energy and motivation have been good.  Patient denies any difficulty with concentration.  Patient denies any suicidal ideation. Sleep better with Xanax than trazodone. Not using alprazolam for anxiety. Plan: No med changes except sleeper: Continues Wellbutrin SR 100 mg every morning, cyclobenzaprine 10 mg nightly only rarely, lithium 300 alternating with 150 every other night, paroxetine 10 mg,  DT low sodium try stopping trazodone and use alprazolam prn  11/13/22 appt noted: Avg 8 hours.  On trazodone. Not depressed .  Some days feels bored.  Not much on schedule to occupy her.  Does help some older women in neighborhood.  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 Needs to start exercise.. First GS in [place].  Don't get to see them much.  Born in Sept.  No SE issues with meds.   Watching coffee intake. No xanax lately.  04/29/23 appt noted:  Mood about the same.  Energy varies and sometimes blah but not always. Low B12 and just started supplement. No consistent anxiety but some mornings worries.  Other mornings are oK. Sleep is fair.  Awakening and then trouble going back to sleep Typical worries with son with baby.  Son unhappy with job.  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    09/04/23 appt noted:  Moved up appt. Psych meds: Alprazolam 0.5 mg twice daily as needed rarely, Wellbutrin SR 100 daily, lithium 150-300 every other day, paroxetine 10 daily, trazodone 50-100 nightly. Taking care of Jillyn Hidden with [facility].  Tripped over his walker and broke her wrist.   He said it was bc of too much wine but only had 2 glasses.   Kids confronted her about her drinking and won't let her keep grandchild bc of it. Did start drinking more during Covid and lost sleeper.  Started drinking at 4 in  afternoon.  Kids say she repeats herself I the evening after drinking.    Not missing out on commitments.   Average 3 glasses per night wine forever.  No other alcohol. During Covid probably drank more.   Sleep 9-6 AM H difficult and contributes to drinking.     Past Psychiatric Medication Trials: paroxetine, several intolerant SSRI's Wellbutrin SR 100 ? ins lithium  Xanax,  trazodone low sodium  History of Neuro eval for tremor.  F & GM alcoholic  Review of Systems:  Review of Systems  Constitutional:  Negative for fatigue.  Musculoskeletal:  Positive for back pain and myalgias.  Neurological:  Negative for dizziness, tremors and light-headedness.  Psychiatric/Behavioral:  Positive for depression. Negative for agitation, behavioral problems, confusion, decreased concentration, dysphoric mood, hallucinations, self-injury, sleep disturbance and suicidal ideas. The patient is not nervous/anxious and is not hyperactive.     Medications: I have reviewed the patient's current medications.  Current Outpatient Medications  Medication Sig Dispense Refill   ALPRAZolam (XANAX) 0.5 MG tablet Take 1 tablet (0.5 mg total) by mouth 2 (two) times daily as needed for anxiety. 45 tablet 1   aspirin EC 81 MG tablet Take 81 mg by mouth daily. Swallow whole.     buPROPion ER (WELLBUTRIN SR) 100 MG 12 hr tablet Take 1 tablet (100 mg total) by mouth daily. 90 tablet 1   carvedilol (COREG) 3.125 MG tablet Take 1 tablet (3.125 mg total) by mouth 2 (two) times daily with a meal. 180 tablet 3   Cholecalciferol (VITAMIN D3) 1.25 MG (50000 UT) CAPS every 14 (fourteen) days.      cyanocobalamin (VITAMIN B12) 1000 MCG/ML injection Inject 1 mL (1,000 mcg total) into the muscle once a week. (Patient taking differently: Inject 1,000 mcg into the muscle once a week. For 4 weeks) 4 mL 0   cyanocobalamin (VITAMIN B12) 1000 MCG/ML injection Inject 1 mL (1,000 mcg total) into the muscle every 30 (thirty) days. 6 mL 0    lisinopril (ZESTRIL) 40 MG tablet Take 1 tablet (40 mg total) by mouth daily. 90 tablet 3   lithium carbonate 150 MG capsule ALTERNATE TAKING 2 CAPSULES AND 1 CAPSULES BY MOUTH EVERY OTHER NIGHT 135 capsule 0   loperamide (IMODIUM A-D) 2 MG tablet Take 1 tablet (2 mg total) by mouth as needed for diarrhea or loose stools. 30 tablet 0   magnesium oxide (MAG-OX) 400 MG tablet Take 400 mg by mouth daily.     nicotine (NICODERM CQ) 14 mg/24hr patch Place 1 patch (14 mg total) onto the skin daily. 30 patch 3   PARoxetine (PAXIL) 10 MG tablet Take 1 tablet (10 mg total) by mouth daily. 90 tablet 1   Pyridoxine HCl (VITAMIN B-6) 500 MG tablet Take 500 mg by mouth as needed.     rosuvastatin (CRESTOR) 20 MG tablet Take 0.5 tablets (10 mg total) by mouth daily. 90 tablet 3   SODIUM CHLORIDE PO Take by mouth in the morning and at bedtime.     traZODone (DESYREL) 50 MG tablet TAKE 1 TO 2 TABLETS BY MOUTH AT NIGHT AS NEEDED FOR SLEEP 135 tablet 0   ezetimibe (ZETIA) 10 MG tablet Take 1 tablet (10 mg total) by mouth daily. 90 tablet 3   No current facility-administered medications for this visit.    Medication Side Effects: Other: tremor recently better  Allergies:  Allergies  Allergen Reactions   Sulfonamide Derivatives Hives, Itching and Rash    Past Medical History:  Diagnosis Date   Acid reflux    Anxiety    Atypical lobular hyperplasia of left breast 11/25/13   lumpectomy, 12/10/13   BCC (basal cell carcinoma)    per pt, never had cancer   Depression    Hyperlipidemia    Hypertension    Osteopenia    Thyroid disease 2010   rt thyroid nodule    Family  History  Problem Relation Age of Onset   Cancer Mother        colon-stage 4   Hypertension Mother    Colon cancer Mother    Other Father    Breast cancer Sister    Colon polyps Neg Hx    Esophageal cancer Neg Hx    Rectal cancer Neg Hx    Stomach cancer Neg Hx     Social History   Socioeconomic History   Marital status:  Married    Spouse name: Not on file   Number of children: 2   Years of education: Not on file   Highest education level: Not on file  Occupational History   Not on file  Tobacco Use   Smoking status: Some Days    Current packs/day: 0.50    Average packs/day: 0.5 packs/day for 35.0 years (17.5 ttl pk-yrs)    Types: Cigarettes   Smokeless tobacco: Never  Vaping Use   Vaping status: Former  Substance and Sexual Activity   Alcohol use: Yes    Alcohol/week: 12.0 - 14.0 standard drinks of alcohol    Types: 12 - 14 Glasses of wine per week   Drug use: No   Sexual activity: Yes    Partners: Male    Birth control/protection: Post-menopausal  Other Topics Concern   Not on file  Social History Narrative   Not on file   Social Drivers of Health   Financial Resource Strain: Not on file  Food Insecurity: Not on file  Transportation Needs: Not on file  Physical Activity: Not on file  Stress: Not on file  Social Connections: Not on file  Intimate Partner Violence: Not on file    Past Medical History, Surgical history, Social history, and Family history were reviewed and updated as appropriate.   Please see review of systems for further details on the patient's review from today.   Objective:   Physical Exam:  LMP 09/24/1999   Physical Exam Constitutional:      General: She is not in acute distress.    Appearance: She is well-developed.  Musculoskeletal:        General: No deformity.  Neurological:     Mental Status: She is alert and oriented to person, place, and time.     Cranial Nerves: No dysarthria.     Coordination: Coordination normal.  Psychiatric:        Attention and Perception: Attention and perception normal. She does not perceive auditory or visual hallucinations.        Mood and Affect: Mood is anxious and depressed. Affect is not labile, angry or inappropriate.        Speech: Speech normal. Speech is not rapid and pressured or slurred.        Behavior:  Behavior normal. Behavior is cooperative.        Thought Content: Thought content normal. Thought content is not paranoid or delusional. Thought content does not include homicidal or suicidal ideation. Thought content does not include suicidal plan.        Cognition and Memory: Cognition and memory normal.        Judgment: Judgment normal.     Comments: Insight good No evidence for cognitive issues. Some concerns about herself as noted.      Lab Review:     Component Value Date/Time   NA 138 05/05/2023 1420   NA 139 07/19/2014 0903   K 4.8 05/05/2023 1420   K 4.7 07/19/2014 0903  CL 102 05/05/2023 1420   CO2 21 05/05/2023 1420   CO2 24 07/19/2014 0903   GLUCOSE 76 05/05/2023 1420   GLUCOSE 79 08/02/2022 1447   GLUCOSE 95 07/19/2014 0903   BUN 24 05/05/2023 1420   BUN 22.8 07/19/2014 0903   CREATININE 0.87 05/05/2023 1420   CREATININE 1.11 (H) 08/02/2022 1447   CREATININE 0.8 07/19/2014 0903   CALCIUM 9.9 05/05/2023 1420   CALCIUM 9.6 07/19/2014 0903   PROT 7.0 08/02/2022 1447   PROT 7.3 07/19/2014 0903   ALBUMIN 4.1 07/19/2014 0903   AST 29 08/02/2022 1447   AST 23 07/19/2014 0903   ALT 22 08/02/2022 1447   ALT 21 07/19/2014 0903   ALKPHOS 54 07/19/2014 0903   BILITOT 0.5 08/02/2022 1447   BILITOT 0.49 07/19/2014 0903   GFRNONAA 53 (L) 06/18/2018 0934   GFRAA 61 06/18/2018 0934       Component Value Date/Time   WBC 7.4 01/22/2023 1457   RBC 3.23 (L) 01/22/2023 1457   HGB 11.2 (L) 01/22/2023 1457   HGB 12.5 07/19/2014 0903   HCT 32.8 (L) 01/22/2023 1457   HCT 37.8 07/19/2014 0903   PLT 423.0 (H) 01/22/2023 1457   PLT 370 07/19/2014 0903   MCV 101.4 (H) 01/22/2023 1457   MCV 96.0 07/19/2014 0903   MCH 31.8 07/19/2014 0903   MCH 31.9 04/05/2011 0923   MCHC 34.2 01/22/2023 1457   RDW 12.8 01/22/2023 1457   RDW 12.8 07/19/2014 0903   LYMPHSABS 1.5 01/22/2023 1457   LYMPHSABS 1.5 07/19/2014 0903   MONOABS 0.9 01/22/2023 1457   MONOABS 0.6 07/19/2014 0903    EOSABS 0.2 01/22/2023 1457   EOSABS 0.1 07/19/2014 0903   BASOSABS 0.1 01/22/2023 1457   BASOSABS 0.0 07/19/2014 0903    Lithium Lvl  Date Value Ref Range Status  08/02/2022 0.5 (L) 0.6 - 1.2 mmol/L Final     No results found for: "PHENYTOIN", "PHENOBARB", "VALPROATE", "CBMZ"   .res Assessment: Plan:    Major depressive disorder, recurrent episode, moderate (HCC)  Alcohol abuse with intoxication (HCC)  Generalized anxiety disorder  Insomnia due to mental condition  Please see After Visit Summary for patient specific instructions.  Overall she remained stable after the reduction in both lithium and paroxetine to current dosages.  Better motivation and task completion and less msoking with Wellbutrin.  30 min counseling: Extensive discussion of alcohol use and concerns she has and family about it's excess.  Does not appear at risk of sig withdrawal nor need for external detox.  Disc SS alcohol withdrawal in case she has understated the amount she is drinking.  Disc CBT strategies to stop excess drinking.  Uncertain if she can moderate.  Disc strategies and if this fails then will need to pursue abstinence.   Offered additional counseling and resources for this.  We discussed her history of response to lithium at the lower dose 150 and then the 300 mg a day.  We discussed her side effects of tremor.  It's better now.   The tremor is better with the reduction to the current dose of lithium and the use of B6.  It is now manageable.  Disc option stopping B6 to see if she still needs it. Last lithium level is low normal.  We discussed the question of her diagnosis given at prior history of 2 weeks of hypomania but that may have been related to medications.  The uncertainty whether she truly has major depression versus bipolar disorder remains.  We discussed the risk of antidepressants causing mood cycling and the advantage to being on a lower dose.  Answered questions about each med and  DDI and risks to liver and kidney  Continue lower paroxetine 10 mg daily,    continue lithium 150 mg alternating with 300 mg every other day.  Counseled patient regarding potential benefits, risks, and side effects of lithium to include potential risk of lithium affecting thyroid and renal function.  Discussed need for periodic lab monitoring to determine drug level and to assess for potential adverse effects.  Counseled patient regarding signs and symptoms of lithium toxicity and advised that they notify office immediately or seek urgent medical attention if experiencing these signs and symptoms.  Patient advised to contact office with any questions or concerns. B6 helped lithium tremor  Trial black cohush.  continue Wellbutrin to 100 mg AM  for mood.  Consider increase if needed. We discussed the short-term risks associated with benzodiazepines including sedation and increased fall risk among others.  Discussed long-term side effect risk including dependence, potential withdrawal symptoms, and the potential eventual dose-related risk of dementia.  But recent studies from 2020 dispute this association between benzodiazepines and dementia risk. Newer studies in 2020 do not support an association with dementia. Disc SE worsen with age.  Disc smoking cessation.. Watch caffeine for tremor.  She has done this.  No med changes except sleeper: Continues Wellbutrin SR 100 mg every morning,  cyclobenzaprine 10 mg nightly only rarely,  lithium 300 alternating with 150 every other night,  paroxetine 10 mg,  trazodone 50 mg HS, sodium tablet daily, option increase  Hx low sodium Last sodium 135 in Jan 2024 Disc risks and continue to be attentive  consider Abilify potentiation  Follow-up 2 mos  Meredith Staggers MD, DFAPA\  Future Appointments  Date Time Provider Department Center  11/05/2023  1:30 PM Cottle, Steva Ready., MD [facility] None      No orders of the defined types were placed in this  encounter.     -------------------------------

## 2023-09-04 NOTE — Patient Instructions (Signed)
Option therapist Bartolo Darter

## 2023-09-12 ENCOUNTER — Encounter: Payer: Self-pay | Admitting: Psychiatry

## 2023-09-12 DIAGNOSIS — S52325D Nondisplaced transverse fracture of shaft of left radius, subsequent encounter for closed fracture with routine healing: Secondary | ICD-10-CM | POA: Diagnosis not present

## 2023-09-12 DIAGNOSIS — S52552D Other extraarticular fracture of lower end of left radius, subsequent encounter for closed fracture with routine healing: Secondary | ICD-10-CM | POA: Diagnosis not present

## 2023-09-12 DIAGNOSIS — M25532 Pain in left wrist: Secondary | ICD-10-CM | POA: Diagnosis not present

## 2023-10-03 DIAGNOSIS — M25532 Pain in left wrist: Secondary | ICD-10-CM | POA: Diagnosis not present

## 2023-10-03 DIAGNOSIS — S52322D Displaced transverse fracture of shaft of left radius, subsequent encounter for closed fracture with routine healing: Secondary | ICD-10-CM | POA: Diagnosis not present

## 2023-10-29 ENCOUNTER — Other Ambulatory Visit: Payer: Self-pay | Admitting: Psychiatry

## 2023-10-29 ENCOUNTER — Ambulatory Visit: Payer: Medicare HMO | Admitting: Psychiatry

## 2023-10-29 DIAGNOSIS — F411 Generalized anxiety disorder: Secondary | ICD-10-CM

## 2023-10-29 DIAGNOSIS — F331 Major depressive disorder, recurrent, moderate: Secondary | ICD-10-CM

## 2023-11-05 ENCOUNTER — Encounter: Payer: Self-pay | Admitting: Psychiatry

## 2023-11-05 ENCOUNTER — Ambulatory Visit (INDEPENDENT_AMBULATORY_CARE_PROVIDER_SITE_OTHER): Payer: Medicare HMO | Admitting: Psychiatry

## 2023-11-05 DIAGNOSIS — E871 Hypo-osmolality and hyponatremia: Secondary | ICD-10-CM

## 2023-11-05 DIAGNOSIS — F10129 Alcohol abuse with intoxication, unspecified: Secondary | ICD-10-CM

## 2023-11-05 DIAGNOSIS — Z79899 Other long term (current) drug therapy: Secondary | ICD-10-CM

## 2023-11-05 DIAGNOSIS — F331 Major depressive disorder, recurrent, moderate: Secondary | ICD-10-CM | POA: Diagnosis not present

## 2023-11-05 DIAGNOSIS — F411 Generalized anxiety disorder: Secondary | ICD-10-CM

## 2023-11-05 DIAGNOSIS — F5105 Insomnia due to other mental disorder: Secondary | ICD-10-CM | POA: Diagnosis not present

## 2023-11-05 NOTE — Progress Notes (Signed)
Robin Barton [id number] 06/27/1956 68 y.o.    Subjective:   Patient ID:  Robin Barton is a 68 y.o. (DOB 1956-05-09) female.  Chief Complaint:  Chief Complaint  Patient presents with   Follow-up    Depression        Associated symptoms include myalgias.  Associated symptoms include no decreased concentration, no fatigue and no suicidal ideas.  Past medical history includes anxiety.   Anxiety Patient reports no confusion, decreased concentration, dizziness, nervous/anxious behavior or suicidal ideas.     [name] presents to the office today for follow-up mood and anxiety.  seen October 2020.  No meds were changed.  She was doing well psychiatrically.  It was recommended she get a lithium level and BMP and Continue paroxetine 20 mg daily, continue trazodone 50 mg nightly, continue lithium 150 mg alternating with 300 mg every other day.  January 17, 2020 appointment the following is noted: .Ok.  Exposed to virus twice and had to QT.  Had to hlep sister with med probvlems.   Forgot the labs.   Gotten vaccinated. Not taking Xanax.   Sleeps Ok with trazodone. Forgot to get labs after reducing the lithium.  Tremor is much better and manageable with the reduction of lithium.  No worsening of mood with the change. Tremor has gotten better on it's own.  No longer needs B6. Plan: Continue paroxetine 20 mg daily, continue trazodone 50 mg nightly, continue lithium 150 mg alternating with 300 mg every other day. Asks for alprazolam prn anxiety.  August 2021 it was discovered she had low sodium and was just recommended she discuss with PCP.  She stopped trazodone and her sodium level improved.  06/26/20 appt noted: Reduced paroxetine to 10 mg from 20 mg daily and stopped trazodone. Low sodium SE caused scary neuro sx.  Had gotten confused and was having tearful episodes.  Sx had developed gradually over time.   Balance issues.   I feel so much better.    Taking  salt tablet twice daily. BP is fine.   Emotionally feels good.  On a cleaning rampage at this time.    Lost a good friend to ALS.   Taking Xanax instead of trazodone for sleep. Plan: No med changes today. Continue lower paroxetine 10 mg daily,  continue lithium 150 mg alternating with 300 mg every other day.  Continue salt tablets Stopped trazodone DT hyponatremia. Asks for alprazolam prn anxiety or insomnia since off the Xanax..  09/26/2020 appointment with following noted: Xanax 0.5 mg HS. Still on salt, paroxetine 10, lithium 300/150 QOD. Asks to take Xanax during the day for anxiety.  Don't seem to have the drive she used to have but once done she feels great.  Some avoidance like social things.  Energy is good. Possibly more anxious at lower dose paroxetine. No crying spells.  H notices reduction in motivation and she doesn't seem like herself. Smokes and needs to stop. Plan: Reduce caffeine bc she gets anxious after taking it and has not reduced as requested. wellbutrin XL 150 mg 1 for 1 week in the AM, then 2 each morning. Repeat serum sodium  11/13/2020 appointment with following noted: Wellbutrin helped me settle down and reduced craving to smoke.  Never got above 150 mg XL.   SE night sweats. Still continues paroxetine 10. Not sleeping as well either with awakening with sweats.  Cut smoking way back to as little as 3-5 per day with less craving from 1/2  to 1 PPD. Emotionally felt better with better task completion.  Better motivation.  Don't feel as anxious in AM and less coffee in the AM.  Plan: Switch to Wellbutrin SR 100 mg AM to reduce SE.  It's helping.  02/06/2021 appointment with the following noted: Not sleeping well with night sweats awakens..Still gets at least 8 hours but prefers 10 h.  Not drowsy. Wonders if related to Wellbutrin.  Feels she needs 1 mg alprazolam to get best response from it.  No hangover.  Felt sleep better with trazodone but afraid of repeat  hyponatremia.   Some depression.  Feels a little lost and not real motivated but not everyday.  Thinks needs job and schedule.  Good enjoyment but less motivation. Kids don't live here and Larence Penning gone a lot on golf trips. Antsy in the morning after up 10-15 mins and reduced caffeine and it helped. Not anxious all day.   Wellbutrin reduced smoking by 50%.  Don't want to give Wellbutrin up.  No daytime XaNAX.   Plan: Switch to Wellbutrin SR 100 mg AM to reduce SE.  It's helping. Disc smoking cessation.. Asks for alprazolam prn anxiety or insomnia since off the trazodone. Ok increase to 0.75 mg HS Continue lower paroxetine 10 mg daily,  continue lithium 150 mg alternating with 300 mg every other day.   06/05/21 appt noted:   Fair.  Not sleeping well.  EMA and jus t lays there in the dark.  Then feels like crap the next day.  Happens 5 days per week.  Took trazdone other night without Xanax without more help. Had slept well with trazodone. Night sweats after she awakens.  Tried black Kohush intermittently.   Redeuced coffee. Smoking better but hasn't quit.  3-5 per day.   .Still having tremor Some occ dep but not usually.  .  Some anxiety over doing things..    Still feels anxious in AM same pattern..  Drinks coffee.  Never had ADD but anxious about doing things she wants to do but doesn't get done..   Denies appetite disturbance.  Patient reports that energy and motivation have been good.  Patient denies any difficulty with concentration.  Patient denies any suicidal ideation. Plan: Switch to Wellbutrin IR 75 mg AM to reduce insomnia SE.  It's helping. Disc smoking cessation..  10/16/2021 appt noted: Fish pond with goldfish. Still not sleeping very well. Klonopin 0.75 wihtout help. Switched back to Xanax 0.75 mg HS but not as good as trazodone. Sleep adequate hours. But awaken in middle of night thinking about things. Back pain unusually and muscular.  RX Robaxin but didn't take yet.  Has  cyclobenzaprine. Sleep doesn't feel restuful. Mood OK not great.  Should exercise. Some anxiety in the AM Plan: Try cyclobenzaprine for sleep and back pain.  She has some Increase Wellbutrin to 100 mg AM  for mood  01/09/22 appt noted: Flexeril didn't work for EMA with worry. Last week restarted trazodone 50 mg HS and it is working. Taking paroxetine 10 and Wellbutrin SR 100 AM.  Tolerated fine.   Energy pretty good. Low sodium 130.  Increased salt tablets to 2 daily.   Some anxiety in the AM. Mood up and down with down lasting 24 hours and seems related to insomnia or not. Trazodone works better for sleep than Xanax.    03/12/2022 appointment with the following noted: Continues Wellbutrin SR 100 mg every morning, cyclobenzaprine 10 mg nightly only rarely, lithium 300 alternating with 150  every other night, paroxetine 10 mg, trazodone 50 mg nightly Pretty good usually.   PE early June sodium up to 138.  Stopped sodium after that appt.  BC gets stuck in throat. Don't drink enough water.   Reports high liver enzyme apparently minor bc PCP didn't order more testing. Patient reports stable mood and denies depressed or irritable moods.  Patient denies any recent difficulty with anxiety.  Patient denies difficulty with sleep initiation or maintenance. Denies appetite disturbance.  Patient reports that energy and motivation have been good.  Patient denies any difficulty with concentration.  Patient denies any suicidal ideation. Plan: No med changes Continues Wellbutrin SR 100 mg every morning, cyclobenzaprine 10 mg nightly only rarely, lithium 300 alternating with 150 every other night, paroxetine 10 mg, trazodone 50 mg nightly Check CMP  07/16/22 appt noted: Lost paperwork for CMP. Can't swallow B6 or salt tablets.  Tried cutting both in half.  They get hung in throat. Otherwise doing great.  Patient reports stable mood and denies depressed or irritable moods.  Patient denies any recent  difficulty with anxiety.  Patient denies difficulty with sleep initiation but EMA about 4 AM often.  Denies appetite disturbance.  Patient reports that energy and motivation have been good.  Patient denies any difficulty with concentration.  Patient denies any suicidal ideation. Sleep better with Xanax than trazodone. Not using alprazolam for anxiety. Plan: No med changes except sleeper: Continues Wellbutrin SR 100 mg every morning, cyclobenzaprine 10 mg nightly only rarely, lithium 300 alternating with 150 every other night, paroxetine 10 mg,  DT low sodium try stopping trazodone and use alprazolam prn  11/13/22 appt noted: Avg 8 hours.  On trazodone. Not depressed .  Some days feels bored.  Not much on schedule to occupy her.  Does help some older women in neighborhood.  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 Needs to start exercise.. First GS in [place].  Don't get to see them much.  Born in Sept.  No SE issues with meds.   Watching coffee intake. No xanax lately.  04/29/23 appt noted:  Mood about the same.  Energy varies and sometimes blah but not always. Low B12 and just started supplement. No consistent anxiety but some mornings worries.  Other mornings are oK. Sleep is fair.  Awakening and then trouble going back to sleep Typical worries with son with baby.  Son unhappy with job.  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    09/04/23 appt noted:  Moved up appt. Psych meds: Alprazolam 0.5 mg twice daily as needed rarely, Wellbutrin SR 100 daily, lithium 150-300 every other day, paroxetine 10 daily, trazodone 50-100 nightly. Taking care of Jillyn Hidden with [facility].  Tripped over his walker and broke her wrist.   He said it was bc of too much wine but only had 2 glasses.   Kids confronted her about her drinking and won't let her keep grandchild bc of it. Did start drinking more during Covid and lost sleeper.  Started drinking at 4 in afternoon.  Kids say she repeats herself I the evening after  drinking.    Not missing out on commitments.   Average 3 glasses per night wine forever.  No other alcohol. During Covid probably drank more.   Sleep 9-6 AM H difficult and contributes to drinking.    11/05/23 appt noted: Psych meds as above. Measuring out alcohol and I think it's helped me.  I feel better.  More energy.  Better mood also.  No  sig dep.  Measuring 3 oz wine nightly.  Drinking more water.  Some feedback from [place].  H noticed better.  Kids not living here and don't know.   Successful moderating.   Sleep ok with trazodone.  Variable.  Wakes at 4 and will start thinking .  Otherwise sleeps well. Tolerating meds.     Past Psychiatric Medication Trials:  paroxetine, several intolerant SSRI's Wellbutrin SR 100 ? ins lithium Xanax,  trazodone low sodium, ok right now.  History of Neuro eval for tremor.  F & GM alcoholic  Review of Systems:  Review of Systems  Constitutional:  Negative for fatigue.  Musculoskeletal:  Positive for back pain and myalgias.  Neurological:  Negative for dizziness and tremors.  Psychiatric/Behavioral:  Negative for agitation, behavioral problems, confusion, decreased concentration, dysphoric mood, hallucinations, self-injury, sleep disturbance and suicidal ideas. The patient is not nervous/anxious and is not hyperactive.     Medications: I have reviewed the patient's current medications.  Current Outpatient Medications  Medication Sig Dispense Refill   ALPRAZolam (XANAX) 0.5 MG tablet Take 1 tablet (0.5 mg total) by mouth 2 (two) times daily as needed for anxiety. 45 tablet 1   aspirin EC 81 MG tablet Take 81 mg by mouth daily. Swallow whole.     buPROPion ER (WELLBUTRIN SR) 100 MG 12 hr tablet TAKE 1 TABLET(100 MG) BY MOUTH DAILY 30 tablet 0   carvedilol (COREG) 3.125 MG tablet Take 1 tablet (3.125 mg total) by mouth 2 (two) times daily with a meal. 180 tablet 3   Cholecalciferol (VITAMIN D3) 1.25 MG (50000 UT) CAPS every 14 (fourteen)  days.      cyanocobalamin (VITAMIN B12) 1000 MCG/ML injection Inject 1 mL (1,000 mcg total) into the muscle once a week. (Patient taking differently: Inject 1,000 mcg into the muscle once a week. For 4 weeks) 4 mL 0   cyanocobalamin (VITAMIN B12) 1000 MCG/ML injection Inject 1 mL (1,000 mcg total) into the muscle every 30 (thirty) days. 6 mL 0   lisinopril (ZESTRIL) 40 MG tablet Take 1 tablet (40 mg total) by mouth daily. 90 tablet 3   lithium carbonate 150 MG capsule ALTERNATE TAKING 2 CAPSULES AND 1 CAPSULES BY MOUTH EVERY OTHER NIGHT 135 capsule 0   loperamide (IMODIUM A-D) 2 MG tablet Take 1 tablet (2 mg total) by mouth as needed for diarrhea or loose stools. 30 tablet 0   magnesium oxide (MAG-OX) 400 MG tablet Take 400 mg by mouth daily.     nicotine (NICODERM CQ) 14 mg/24hr patch Place 1 patch (14 mg total) onto the skin daily. 30 patch 3   PARoxetine (PAXIL) 10 MG tablet TAKE 1 TABLET(10 MG) BY MOUTH DAILY 30 tablet 0   Pyridoxine HCl (VITAMIN B-6) 500 MG tablet Take 500 mg by mouth as needed.     rosuvastatin (CRESTOR) 20 MG tablet Take 0.5 tablets (10 mg total) by mouth daily. 90 tablet 3   SODIUM CHLORIDE PO Take by mouth in the morning and at bedtime.     traZODone (DESYREL) 50 MG tablet TAKE 1 TO 2 TABLETS BY MOUTH AT NIGHT AS NEEDED FOR SLEEP 135 tablet 0   ezetimibe (ZETIA) 10 MG tablet Take 1 tablet (10 mg total) by mouth daily. 90 tablet 3   No current facility-administered medications for this visit.    Medication Side Effects: Other: tremor recently better  Allergies:  Allergies  Allergen Reactions   Sulfonamide Derivatives Hives, Itching and Rash    Past Medical  History:  Diagnosis Date   Acid reflux    Anxiety    Atypical lobular hyperplasia of left breast 11/25/13   lumpectomy, 12/10/13   BCC (basal cell carcinoma)    per pt, never had cancer   Depression    Hyperlipidemia    Hypertension    Osteopenia    Thyroid disease 2010   rt thyroid nodule    Family  History  Problem Relation Age of Onset   Cancer Mother        colon-stage 4   Hypertension Mother    Colon cancer Mother    Other Father    Breast cancer Sister    Colon polyps Neg Hx    Esophageal cancer Neg Hx    Rectal cancer Neg Hx    Stomach cancer Neg Hx     Social History   Socioeconomic History   Marital status: Married    Spouse name: Not on file   Number of children: 2   Years of education: Not on file   Highest education level: Not on file  Occupational History   Not on file  Tobacco Use   Smoking status: Some Days    Current packs/day: 0.50    Average packs/day: 0.5 packs/day for 35.0 years (17.5 ttl pk-yrs)    Types: Cigarettes   Smokeless tobacco: Never  Vaping Use   Vaping status: Former  Substance and Sexual Activity   Alcohol use: Yes    Alcohol/week: 12.0 - 14.0 standard drinks of alcohol    Types: 12 - 14 Glasses of wine per week   Drug use: No   Sexual activity: Yes    Partners: Male    Birth control/protection: Post-menopausal  Other Topics Concern   Not on file  Social History Narrative   Not on file   Social Drivers of Health   Financial Resource Strain: Not on file  Food Insecurity: Not on file  Transportation Needs: Not on file  Physical Activity: Not on file  Stress: Not on file  Social Connections: Not on file  Intimate Partner Violence: Not on file    Past Medical History, Surgical history, Social history, and Family history were reviewed and updated as appropriate.   Please see review of systems for further details on the patient's review from today.   Objective:   Physical Exam:  LMP 09/24/1999   Physical Exam Constitutional:      General: She is not in acute distress.    Appearance: She is well-developed.  Musculoskeletal:        General: No deformity.  Neurological:     Mental Status: She is alert and oriented to person, place, and time.     Cranial Nerves: No dysarthria.     Coordination: Coordination normal.   Psychiatric:        Attention and Perception: Attention and perception normal. She does not perceive auditory or visual hallucinations.        Mood and Affect: Mood is anxious and depressed. Affect is not labile, angry or inappropriate.        Speech: Speech normal. Speech is not rapid and pressured or slurred.        Behavior: Behavior normal. Behavior is cooperative.        Thought Content: Thought content normal. Thought content is not paranoid or delusional. Thought content does not include homicidal or suicidal ideation. Thought content does not include suicidal plan.        Cognition and Memory: Cognition and  memory normal.        Judgment: Judgment normal.     Comments: Insight good No evidence for cognitive issues. Some concerns about herself as noted.      Lab Review:     Component Value Date/Time   NA 138 05/05/2023 1420   NA 139 07/19/2014 0903   K 4.8 05/05/2023 1420   K 4.7 07/19/2014 0903   CL 102 05/05/2023 1420   CO2 21 05/05/2023 1420   CO2 24 07/19/2014 0903   GLUCOSE 76 05/05/2023 1420   GLUCOSE 79 08/02/2022 1447   GLUCOSE 95 07/19/2014 0903   BUN 24 05/05/2023 1420   BUN 22.8 07/19/2014 0903   CREATININE 0.87 05/05/2023 1420   CREATININE 1.11 (H) 08/02/2022 1447   CREATININE 0.8 07/19/2014 0903   CALCIUM 9.9 05/05/2023 1420   CALCIUM 9.6 07/19/2014 0903   PROT 7.0 08/02/2022 1447   PROT 7.3 07/19/2014 0903   ALBUMIN 4.1 07/19/2014 0903   AST 29 08/02/2022 1447   AST 23 07/19/2014 0903   ALT 22 08/02/2022 1447   ALT 21 07/19/2014 0903   ALKPHOS 54 07/19/2014 0903   BILITOT 0.5 08/02/2022 1447   BILITOT 0.49 07/19/2014 0903   GFRNONAA 53 (L) 06/18/2018 0934   GFRAA 61 06/18/2018 0934       Component Value Date/Time   WBC 7.4 01/22/2023 1457   RBC 3.23 (L) 01/22/2023 1457   HGB 11.2 (L) 01/22/2023 1457   HGB 12.5 07/19/2014 0903   HCT 32.8 (L) 01/22/2023 1457   HCT 37.8 07/19/2014 0903   PLT 423.0 (H) 01/22/2023 1457   PLT 370 07/19/2014  0903   MCV 101.4 (H) 01/22/2023 1457   MCV 96.0 07/19/2014 0903   MCH 31.8 07/19/2014 0903   MCH 31.9 04/05/2011 0923   MCHC 34.2 01/22/2023 1457   RDW 12.8 01/22/2023 1457   RDW 12.8 07/19/2014 0903   LYMPHSABS 1.5 01/22/2023 1457   LYMPHSABS 1.5 07/19/2014 0903   MONOABS 0.9 01/22/2023 1457   MONOABS 0.6 07/19/2014 0903   EOSABS 0.2 01/22/2023 1457   EOSABS 0.1 07/19/2014 0903   BASOSABS 0.1 01/22/2023 1457   BASOSABS 0.0 07/19/2014 0903    Lithium Lvl  Date Value Ref Range Status  08/02/2022 0.5 (L) 0.6 - 1.2 mmol/L Final     No results found for: "PHENYTOIN", "PHENOBARB", "VALPROATE", "CBMZ"   .res Assessment: Plan:    Major depressive disorder, recurrent episode, moderate (HCC)  Alcohol abuse with intoxication (HCC)  Generalized anxiety disorder  Insomnia due to mental condition  Hyponatremia - Plan: Basic metabolic panel  Lithium use  Please see After Visit Summary for patient specific instructions.  Overall she remained stable after the reduction in both lithium and paroxetine to current dosages.  Better motivation and task completion and less msoking with Wellbutrin.  30 min counseling: Extensive discussion of alcohol use and concerns she has and family about it's excess.  Does not appear at risk of sig withdrawal nor need for external detox.  Disc SS alcohol withdrawal in case she has understated the amount she is drinking.  Disc CBT strategies to stop excess drinking.  Uncertain if she can moderate.  Disc strategies and if this fails then will need to pursue abstinence.   Offered additional counseling and resources for this.  We discussed her history of response to lithium at the lower dose 150 and then the 300 mg a day.  We discussed her side effects of tremor.  It's better now.  The tremor is better with the reduction to the current dose of lithium and the use of B6.  It is now manageable.  Disc option stopping B6 to see if she still needs it. Last lithium  level is low normal.  We discussed the question of her diagnosis given at prior history of 2 weeks of hypomania but that may have been related to medications.  The uncertainty whether she truly has major depression versus bipolar disorder remains.  We discussed the risk of antidepressants causing mood cycling and the advantage to being on a lower dose.  Answered questions about each med and DDI and risks to liver and kidney  Continue lower paroxetine 10 mg daily,    continue lithium 150 mg alternating with 300 mg every other day.  Counseled patient regarding potential benefits, risks, and side effects of lithium to include potential risk of lithium affecting thyroid and renal function.  Discussed need for periodic lab monitoring to determine drug level and to assess for potential adverse effects.  Counseled patient regarding signs and symptoms of lithium toxicity and advised that they notify office immediately or seek urgent medical attention if experiencing these signs and symptoms.  Patient advised to contact office with any questions or concerns. B6 helped lithium tremor  Trial black cohush.  continue Wellbutrin to 100 mg AM  for mood.  Consider increase if needed. We discussed the short-term risks associated with benzodiazepines including sedation and increased fall risk among others.  Discussed long-term side effect risk including dependence, potential withdrawal symptoms, and the potential eventual dose-related risk of dementia.  But recent studies from 2020 dispute this association between benzodiazepines and dementia risk. Newer studies in 2020 do not support an association with dementia. Disc SE worsen with age.  Disc smoking cessation.. Watch caffeine for tremor.  She has done this.  No med changes :  Continues Wellbutrin SR 100 mg every morning,  cyclobenzaprine 10 mg nightly only rarely,  lithium 300 alternating with 150 every other night,  paroxetine 10 mg,  trazodone 50 mg HS,  sodium tablet daily, option increase  Hx low sodium Last sodium normal August 2024 Repeat sodium level.  Disc therapist options;  Counselor options:  Adalberto Ill, Gabriel Rung Crutchfield  Follow-up 4 mos  Meredith Staggers MD, DFAPA\  Future Appointments  Date Time Provider Department Center  12/29/2023  3:40 PM Kathleene Hazel, MD [id number]      Orders Placed This Encounter  Procedures   Basic metabolic panel      -------------------------------

## 2023-11-05 NOTE — Patient Instructions (Signed)
Counselor options:  Adalberto Ill, Monique Crutchfield

## 2023-11-14 DIAGNOSIS — M25532 Pain in left wrist: Secondary | ICD-10-CM | POA: Diagnosis not present

## 2023-11-14 DIAGNOSIS — M1812 Unilateral primary osteoarthritis of first carpometacarpal joint, left hand: Secondary | ICD-10-CM | POA: Diagnosis not present

## 2023-11-14 DIAGNOSIS — S52502D Unspecified fracture of the lower end of left radius, subsequent encounter for closed fracture with routine healing: Secondary | ICD-10-CM | POA: Diagnosis not present

## 2023-11-14 DIAGNOSIS — S52552D Other extraarticular fracture of lower end of left radius, subsequent encounter for closed fracture with routine healing: Secondary | ICD-10-CM | POA: Diagnosis not present

## 2023-11-27 DIAGNOSIS — E871 Hypo-osmolality and hyponatremia: Secondary | ICD-10-CM | POA: Diagnosis not present

## 2023-11-28 LAB — BASIC METABOLIC PANEL
BUN: 25 mg/dL (ref 7–25)
CO2: 23 mmol/L (ref 20–32)
Calcium: 9.8 mg/dL (ref 8.6–10.4)
Chloride: 103 mmol/L (ref 98–110)
Creat: 0.91 mg/dL (ref 0.50–1.05)
Glucose, Bld: 98 mg/dL (ref 65–139)
Potassium: 4.8 mmol/L (ref 3.5–5.3)
Sodium: 134 mmol/L — ABNORMAL LOW (ref 135–146)

## 2023-12-04 ENCOUNTER — Other Ambulatory Visit: Payer: Self-pay | Admitting: Psychiatry

## 2023-12-04 DIAGNOSIS — F411 Generalized anxiety disorder: Secondary | ICD-10-CM

## 2023-12-04 DIAGNOSIS — F331 Major depressive disorder, recurrent, moderate: Secondary | ICD-10-CM

## 2023-12-11 NOTE — Therapy (Signed)
 OUTPATIENT OCCUPATIONAL THERAPY ORTHO EVALUATION  Patient Name: Robin Barton MRN: [id number] DOB:1955-12-07, 68 y.o., female Today's Date: 12/12/2023  PCP: Dr. Alysia Penna REFERRING PROVIDER: Carmelia Bake, PA-C  END OF SESSION:  OT End of Session - 12/12/23 1200     Visit Number 1    Number of Visits 5    Date for OT Re-Evaluation 01/18/24    Authorization Type [employer]--awaiting auth    Authorization - Number of Visits 1    OT Start Time 1105    OT Stop Time 1148    OT Time Calculation (min) 43 min    Activity Tolerance Patient tolerated treatment well    Behavior During Therapy WFL for tasks assessed/performed             Past Medical History:  Diagnosis Date   Acid reflux    Anxiety    Atypical lobular hyperplasia of left breast 11/25/13   lumpectomy, 12/10/13   BCC (basal cell carcinoma)    per pt, never had cancer   Depression    Hyperlipidemia    Hypertension    Osteopenia    Thyroid disease 2010   rt thyroid nodule   Past Surgical History:  Procedure Laterality Date   BREAST LUMPECTOMY WITH RADIOACTIVE SEED LOCALIZATION Left 12/10/2013   Procedure: BREAST LUMPECTOMY WITH RADIOACTIVE SEED LOCALIZATION;  Surgeon: Ernestene Mention, MD;  Location: [facility];  Service: General;  Laterality: Left;   BUNIONECTOMY  2/13 left foot and toe straightened   BUNIONECTOMY  3/13 right foot and toe straightened   COLONOSCOPY     NASAL SEPTUM SURGERY  1982   Patient Active Problem List   Diagnosis Date Noted   Lithium-induced tremor 07/22/2018   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) 07/22/2018   Neoplasm of left breast, 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) 04/06/2014   Hypertension 04/22/2011   Hyperlipidemia 04/22/2011   Depression 04/22/2011   Osteopenia 04/22/2011   Thyroid nodule 04/22/2011   Family history of colon cancer 04/22/2011    ONSET DATE: November 2024  REFERRING DIAG: B28.413 (ICD-10-CM)  - Pain in left wrist S52.552D (ICD-10-CM) - Other extraarticular fracture of lower end of left radius, subsequent encounter for closed fracture with routine healing M18.12 (ICD-10-CM) - 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 Yes: NPRS scale: 0-4/10 Pain location: L wrist/thumb CMC Pain description: dull Aggravating factors: picking up heavier things (pot) Relieving factors: rest  FALLS: Has patient fallen in last 6 months? Yes. Number of falls tripped over husbands walker with injury  LIVING ENVIRONMENT: Lives with: lives with their family and lives with their spouse   PLOF: Independent  PATIENT GOALS: make sure that I'm doing the right thing  NEXT MD VISIT: prn  OBJECTIVE:  Note: Objective measures were completed at Evaluation unless otherwise noted.  HAND DOMINANCE: Right  ADLs: Overall ADLs: Pt goes to [facility] exercise class 2-4x  wk and uses weights typically (has not been using weights with L hand currently). Pt reports pain with picking up heavier pots, bags.    FUNCTIONAL OUTCOME MEASURES: Quick Dash: 25%  UPPER EXTREMITY ROM:   L thumb CMC subluxation  Active ROM Right eval Left eval  Shoulder flexion    Shoulder abduction    Shoulder adduction    Shoulder extension    Shoulder internal rotation    Shoulder external rotation    Elbow flexion    Elbow extension    Wrist flexion  75   Wrist extension  60  Wrist ulnar deviation  35 (noted ulnar drift at rest)  Wrist radial deviation  20  Wrist pronation    Wrist supination    (Blank rows = not tested)    UPPER EXTREMITY MMT:   NT   HAND FUNCTION: Grip strength: Right: 29 lbs; Left: 21 lbs  COORDINATION: WFL  SENSATION: Pt reports tingling/numbness and pain at night at times, hx of CTS  EDEMA: none noted  COGNITION: Overall cognitive status: Within functional limits for tasks assessed  OBSERVATIONS:  motivated to improve and return to fitness classes   TREATMENT DATE:    12/12/23:    evaluation completed and education provided--see education section.                                                                                                                          PATIENT EDUCATION: Education details: Initial HEP--see pt instructions, OT POC/role and eval results.  Recommended that pt wait prior to using weights in LUE during exercise classes.  Recommended pt wear previous CTS splint at night due to nighttime paraesthesias reported  Person educated: Patient Education method: Explanation, Demonstration, Tactile cues, Verbal cues, and Handouts Education comprehension: verbalized understanding, returned demonstration, and verbal cues required  HOME EXERCISE PROGRAM: 12/12/23:  Initial HEP--see pt instructions.  GOALS: Goals reviewed with patient? Yes   LONG TERM GOALS: Target date: 01/18/24  Pt will be independent with HEP. Goal status: INITIAL  2.  Pt will verbalize understanding of activity modifications to decr pain and for joint protection during ADLs/IADLs (and during exercise classes). Goal status: INITIAL  3.  Pt will improve L hand functional use and pain as shown by improving score on Quick Dash to 15% or less. Baseline: 25% Goal status: INITIAL  4.  Pt will improve L grip strength by 4lbs. Baseline: 21lbs Goal status: INITIAL   ASSESSMENT:  CLINICAL IMPRESSION: Patient  is a 68 y.o. female who was seen today for occupational therapy evaluation for Fx of L radius, OA first carpometacarpal joint L.  Pt was independent prior to injury but currently with incr pain/difficulty using LUE functionally due to pain, decr strength, decr ROM.  Pt would benefit from occupational therapy to address these deficits for improved quality of life and L hand functional use.   PERFORMANCE DEFICITS: in functional skills including ADLs, IADLs, ROM, strength, pain, endurance, decreased knowledge of  precautions, decreased knowledge of use of DME, and UE functional use, and psychosocial skills including habits and routines and behaviors.   IMPAIRMENTS: are limiting patient from ADLs, IADLs, and leisure.   COMORBIDITIES: may have co-morbidities  that affects occupational performance. Patient will benefit from skilled OT to address above impairments and improve overall function.  MODIFICATION OR ASSISTANCE TO COMPLETE EVALUATION: No modification of tasks or assist necessary to complete an evaluation.  OT OCCUPATIONAL PROFILE AND HISTORY: Problem focused assessment: Including review of records relating to presenting problem.  CLINICAL DECISION MAKING: Moderate - several treatment options, min-mod task modification necessary  REHAB POTENTIAL: Good  EVALUATION COMPLEXITY: Low      PLAN:  OT FREQUENCY: 1x/week  OT DURATION: 4 weeks + eval  PLANNED INTERVENTIONS: 97535 self care/ADL training, 66063 therapeutic exercise, 97530 therapeutic activity, 97112 neuromuscular re-education, 97140 manual therapy, 97035 ultrasound, 97018 paraffin, 01601 moist heat, 97010 cryotherapy, 97760 Orthotics management and training, passive range of motion, patient/family education, and DME and/or AE instructions  RECOMMENDED OTHER SERVICES: none at this time  CONSULTED AND AGREED WITH PLAN OF CARE: Patient  PLAN FOR NEXT SESSION: check current splints, educate pt in possible benefits of soft pre-fab  CMC splint, review HEP and progress as able, begin education in activity modification     Quantarius Genrich, OTR/L 12/12/2023, 12:10 PM

## 2023-12-12 ENCOUNTER — Ambulatory Visit: Attending: Hand Surgery | Admitting: Occupational Therapy

## 2023-12-12 DIAGNOSIS — M6281 Muscle weakness (generalized): Secondary | ICD-10-CM | POA: Insufficient documentation

## 2023-12-12 DIAGNOSIS — M25532 Pain in left wrist: Secondary | ICD-10-CM | POA: Insufficient documentation

## 2023-12-12 DIAGNOSIS — M25632 Stiffness of left wrist, not elsewhere classified: Secondary | ICD-10-CM | POA: Diagnosis not present

## 2023-12-12 NOTE — Patient Instructions (Signed)
 Wrist Flexion: Resisted   With right palm up, 1 pound weight in hand, bend wrist up. Return slowly. Repeat 15 times per set.  Do 2 sessions per day.  Wrist Extension: Resisted   With right palm down, 1 pound weight in hand, bend wrist up. Return slowly. Repeat 15 times per set. Do 2 sessions per day.   AROM: Wrist Radial Deviation    With right thumb up, bend wrist up. Repeat 10 times per set. Do 2 sessions per day.   Radial Finger Walk (Active to Counteract Ulnar Deviation    With palm flat on table and held steady, lift or slide fingers one by one toward thumb. Hold fingers in position and lift entire hand up from table to reposition for next repetition. Repeat 10 times. Do 2 sessions per day. Composite Extension (Active)    Bring thumb up and out in hitchhiker position. Hold 3 seconds. Repeat 10 times. Do 2 sessions per day.

## 2023-12-19 ENCOUNTER — Ambulatory Visit: Admitting: Occupational Therapy

## 2023-12-25 NOTE — Therapy (Signed)
 OUTPATIENT OCCUPATIONAL THERAPY ORTHO TREATMENT  Patient Name: Robin Barton MRN: [id number] DOB:[birth year], 68 y.o., female Today's Date: 12/26/2023  PCP: Dr. Alysia Penna REFERRING PROVIDER: Carmelia Bake, PA-C  END OF SESSION:  OT End of Session - 12/26/23 1056     Visit Number 2    Number of Visits 5    Date for OT Re-Evaluation 01/18/24    Authorization Type [employer]--5 by 4/18    Authorization - Number of Visits 2    OT Start Time 1100    OT Stop Time 1146    OT Time Calculation (min) 46 min    Activity Tolerance Patient tolerated treatment well    Behavior During Therapy [facility] for tasks assessed/performed              Past Medical History:  Diagnosis Date   Acid reflux    Anxiety    Atypical lobular hyperplasia of left breast 11/25/13   lumpectomy, 12/10/13   BCC (basal cell carcinoma)    per pt, never had cancer   Depression    Hyperlipidemia    Hypertension    Osteopenia    Thyroid disease 2010   rt thyroid nodule   Past Surgical History:  Procedure Laterality Date   BREAST LUMPECTOMY WITH RADIOACTIVE SEED LOCALIZATION Left 12/10/2013   Procedure: BREAST LUMPECTOMY WITH RADIOACTIVE SEED LOCALIZATION;  Surgeon: Ernestene Mention, MD;  Location:  [facility];  Service: General;  Laterality: Left;   BUNIONECTOMY  2/13 left foot and toe straightened   BUNIONECTOMY  3/13 right foot and toe straightened   COLONOSCOPY     NASAL SEPTUM SURGERY  1982   Patient Active Problem List   Diagnosis Date Noted   Lithium-induced tremor 07/22/2018   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) 07/22/2018   Neoplasm of left breast, 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) 04/06/2014   Hypertension 04/22/2011   Hyperlipidemia 04/22/2011   Depression 04/22/2011   Osteopenia 04/22/2011   Thyroid nodule 04/22/2011   Family history of colon cancer 04/22/2011    ONSET DATE: November 2024  REFERRING DIAG: Y86.578 (ICD-10-CM) -  Pain in left wrist S52.552D (ICD-10-CM) - Other extraarticular fracture of lower end of left radius, subsequent encounter for closed fracture with routine healing M18.12 (ICD-10-CM) - 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   Pt accompanied by: self  PERTINENT HISTORY: tripped 07/2023 with fx of L radius, casted x5-6wks per pt report, OA first carpometacarpal joint L  PMH:  hx of CTS and arthritis  PRECAUTIONS: None  WEIGHT BEARING RESTRICTIONS: No  PAIN:  Are you having pain? Yes: NPRS scale: 0-4/10 Pain location: L wrist/thumb CMC Pain description: dull Aggravating factors: picking up heavier things (pot) Relieving factors: rest  FALLS: Has patient fallen in last 6 months? Yes. Number of falls tripped over husbands walker with injury  LIVING ENVIRONMENT: Lives with: lives with their family and lives with their spouse   PLOF: Independent  PATIENT GOALS: make sure that I'm doing the right thing  NEXT MD VISIT: prn  OBJECTIVE:  Note: Objective measures were completed at Evaluation unless otherwise noted.  HAND DOMINANCE: Right  ADLs: Overall ADLs: Pt goes to [facility] exercise class 2-4x wk and  uses weights typically (has not been using weights with L hand currently). Pt reports pain with picking up heavier pots, bags.    FUNCTIONAL OUTCOME MEASURES: Quick Dash: 25%  UPPER EXTREMITY ROM:   L thumb CMC subluxation  Active ROM Right eval Left eval  Shoulder flexion    Shoulder abduction    Shoulder adduction    Shoulder extension    Shoulder internal rotation    Shoulder external rotation    Elbow flexion    Elbow extension    Wrist flexion  75  Wrist  extension  60  Wrist ulnar deviation  35 (noted ulnar drift at rest)  Wrist radial deviation  20  Wrist pronation    Wrist supination    (Blank rows = not tested)    UPPER EXTREMITY MMT:   NT   HAND FUNCTION: Grip strength: Right: 29 lbs; Left: 21 lbs  COORDINATION: WFL  SENSATION: Pt reports tingling/numbness and pain at night at times, hx of CTS  EDEMA: none noted  COGNITION: Overall cognitive status: Within functional limits for tasks assessed  OBSERVATIONS:  motivated to improve and return to fitness classes   TREATMENT DATE:    12/26/23:  Reviewed HEP and recommended pt continue with 1lb wt and do 2 sets of wrist exercises 2x/day, educated pt in joint protection techniques/energy conservation/AE and adaptive strategies to decr/prevent pain. Checked pt's current splint (pre-fab forearm-based thumb spica) and recommended pt to continue to wear at night prn, but recommended soft pre-fab comfort cool CMC splint for daytime use and improved thumb positioning (shown picture, wrote down description, and where to purchase).     12/12/23:    evaluation completed and education provided--see education section.                                                                                                                          PATIENT EDUCATION: Education details: Reviewed HEP and recommended pt continue with 1lb wt and do 2 sets of wrist exercises 2x/day, educated pt in joint protection techniques/energy conservation/AE and adaptive strategies to decr/prevent pain.  Checked pt's current splint (pre-fab forearm-based thumb spica) and recommended pt to continue to wear at night prn, but recommended soft pre-fab comfort cool CMC splint for daytime use (and during exercise class) for improved thumb positioning (shown picture, wrote down description, and where to purchase).     Person educated: Patient Education method: Explanation, Demonstration, Tactile cues, Verbal cues, and  Handouts Education comprehension: verbalized understanding, returned demonstration, and verbal cues required  HOME EXERCISE PROGRAM: 12/12/23:  Initial HEP--see pt instructions. 12/26/23:  Joint protection techniques/energy conservation/AE and adaptive strategies, recommended soft pre-fab CMC splint for daytime wear  GOALS: Goals reviewed with patient? Yes   LONG TERM GOALS: Target date: 01/18/24  Pt will be independent with HEP. Goal status: INITIAL  2.  Pt will verbalize understanding of activity modifications to decr pain and for joint protection during ADLs/IADLs (and during exercise classes). Goal status:  INITIAL  3.  Pt will improve L hand functional use and pain as shown by improving score on Quick Dash to 15% or less. Baseline: 25% Goal status: INITIAL  4.  Pt will improve L grip strength by 4lbs. Baseline: 21lbs Goal status: INITIAL   ASSESSMENT:  CLINICAL IMPRESSION: Pt is progressing towards goals.  She verbalizes understanding of initial HEP, joint protection techniques and activity modification/AE.   PERFORMANCE DEFICITS: in functional skills including ADLs, IADLs, ROM, strength, pain, endurance, decreased knowledge of precautions, decreased knowledge of use of DME, and UE functional use, and psychosocial skills including habits and routines and behaviors.   IMPAIRMENTS: are limiting patient from ADLs, IADLs, and leisure.   COMORBIDITIES: may have co-morbidities  that affects occupational performance. Patient will benefit from skilled OT to address above impairments and improve overall function.  MODIFICATION OR ASSISTANCE TO COMPLETE EVALUATION: No modification of tasks or assist necessary to complete an evaluation.  OT OCCUPATIONAL PROFILE AND HISTORY: Problem focused assessment: Including review of records relating to presenting problem.  CLINICAL DECISION MAKING: Moderate - several treatment options, min-mod task modification necessary  REHAB POTENTIAL:  Good  EVALUATION COMPLEXITY: Low      PLAN:  OT FREQUENCY: 1x/week  OT DURATION: 4 weeks + eval  PLANNED INTERVENTIONS: 97535 self care/ADL training, 82956 therapeutic exercise, 97530 therapeutic activity, 97112 neuromuscular re-education, 97140 manual therapy, 97035 ultrasound, 97018 paraffin, 21308 moist heat, 97010 cryotherapy, 97760 Orthotics management and training, passive range of motion, patient/family education, and DME and/or AE instructions  RECOMMENDED OTHER SERVICES: none at this time  CONSULTED AND AGREED WITH PLAN OF CARE: Patient  PLAN FOR NEXT SESSION:  progress HEP as able (?add yellow putty, incr to 2lb wt), continue with activity modification prn.     Tracey Stewart, OTR/L 12/26/2023, 11:59 AM

## 2023-12-26 ENCOUNTER — Ambulatory Visit: Attending: Hand Surgery | Admitting: Occupational Therapy

## 2023-12-26 ENCOUNTER — Encounter: Payer: Self-pay | Admitting: Occupational Therapy

## 2023-12-26 DIAGNOSIS — M25632 Stiffness of left wrist, not elsewhere classified: Secondary | ICD-10-CM | POA: Diagnosis not present

## 2023-12-26 DIAGNOSIS — M25532 Pain in left wrist: Secondary | ICD-10-CM | POA: Diagnosis not present

## 2023-12-26 DIAGNOSIS — M6281 Muscle weakness (generalized): Secondary | ICD-10-CM | POA: Diagnosis not present

## 2023-12-26 NOTE — Patient Instructions (Signed)
Joint Protection Techniques  Avoid Damaging Postures:  Try to avoid staying in one position for prolonged periods of time, as it can result in stiffness, pain, and lead to deformity.  Avoid positions of flexion for long periods of time.  The position of your joints is important during activity and rest.  Sitting:  make sure you have good support (chair with back and arm rests if possible) and your feet supported are on the floor  Laying down:  Lay flat on your back with your hips and knees extended and a small pillow under your head.  Your shoulders should be flat on the bed and palms and wrists flat on bed when possible.  (If on your side, try to only have wrist, fingers relaxed and with only slight bend, and only slight bend in elbow and knees)  Avoid Deforming Forces:  Prolonged powerful flexion of fingers example: holding a phone for extended time (try holding with flat hand and using the other hand to control phone, use speakerphone or headset/earphones) keep your fingers extended underneath an object that you are carrying use a flat hand when cleaning and use "scrubbing sponge/brush" to do more of the work for you or let cleaning solution soak so that you don't have to scrub as hard Use grippers, devices for opening containers/bottles, shelf liner, electric can/jar openers, scissors/spring-loaded scissors to assist in opening containers, jars, bottles, and packages Hold items with fingers around outside of cup vs. rim so that fingers stay open  Prolonged power flexion of the wrist:   holding onto grocery bags or purse (try to place them on shoulder or forearm so that stronger joints can do the work) Management consultant by pulling it back and forth across faucet  or press sponge with flat hand to remove water  Body weight:  extra body weight causes a greater strain on your joints  Weight of objects Example:  weight of coat on the fingers when holding and removing coat from a  hanger (try placing coat on counter to remove hanger) Example:  carrying pots/pans/plates (use both hands) Use small cart to carry items Slide items across countertop rather than lifting  When it is time to replace items, look for and replace with lighter weight alternatives  Use good body mechanics:  lift by bending at knees, get close to object, hug it close to your body when possible Use spray hose on sink to fill pots to avoid lifting  Direction of Pull on Joints Example:  pulling a heavy shopping cart with your fingers (instead, push with your whole body weight and allow momentum to assist you)  Ulnar deviation (holding/moving wrist toward little finger) Example:  carrying a shopping bag or holding a phone in front of you (see above for alternatives) Wipe toward thumb instead (away from little finger)   Conserve Energy:  Organize: Place frequently used items or heavier items between shoulder and waist height to improve positioning Place items where they are used  Keep frequently used items on countertop  Position yourself to sit when able and to limit bending, twisting, squatting Use long handled items for cleaning  Planning:   Balance things you have to do with things that you enjoy and rest Spread out tasks that are more demanding throughout the week/day Simplify tasks Air dry dishes, one pot meals, etc. Pacing: Rushing decreases the likelihood that you will use proper techniques/position Work at a moderate pace.  Avoid "overdoing it"  Joint Protection (Wringing)    Avoid wringing towels by twisting. Solution: Loop towel around sink faucet as if braiding and pull gently, or let drip-dry.   Joint Protection (Weight Bearing)    Avoid leaning on knuckles. Solution: Open fingers and use pad of hand when needed. Put extra cushions or folded blanket on seats to avoid using hands for pushing up to stand.   Joint Protection (Use Large Joints)    Avoid  placing pressure on fingertips. Solution: Transfer work to other parts of body which are not affected or which have greater strength. Using body weight to push heavy doors open is an example.  Copyright  VHI. All rights reserved.  Joint Protection (Ulnar Deviation)    Avoid positions that cause fingers to lean sideways toward little finger. Solution: Use devices like jar-openers to assist in activities.   Joint Protection (Lifting)    Avoid picking up heavy items with one hand. Solution: Use both hands, and slide item whenever possible.   Joint Protection (Pinch)    Avoid tight pinch, such as when holding a pen. Solution: Use a thick pen with a felt tip to reduce pressure on fingers.  Joint Protection (Grip)    Avoid: grasping thin utensils for prolonged periods. Solution: Hold thick-handled tools in dagger fashion when-ever possible for performing tasks such as stirring or scrub-bing. Relax fingers every 10 minutes during activity.  Copyright  VHI. All rights reserved.  Joint Protection (Carrying)    Avoid carrying items with weight on fingers. Solution: Use a shoulder bag or a back pack.

## 2023-12-29 ENCOUNTER — Encounter: Payer: Self-pay | Admitting: Cardiovascular Disease

## 2023-12-29 ENCOUNTER — Ambulatory Visit: Payer: Medicare HMO | Attending: Cardiovascular Disease | Admitting: Cardiovascular Disease

## 2023-12-29 VITALS — BP 106/64 | HR 59 | Ht 62.0 in | Wt 118.0 lb

## 2023-12-29 DIAGNOSIS — I10 Essential (primary) hypertension: Secondary | ICD-10-CM | POA: Diagnosis not present

## 2023-12-29 DIAGNOSIS — Z720 Tobacco use: Secondary | ICD-10-CM | POA: Diagnosis not present

## 2023-12-29 DIAGNOSIS — I2510 Atherosclerotic heart disease of native coronary artery without angina pectoris: Secondary | ICD-10-CM | POA: Diagnosis not present

## 2023-12-29 DIAGNOSIS — E7800 Pure hypercholesterolemia, unspecified: Secondary | ICD-10-CM | POA: Diagnosis not present

## 2023-12-29 NOTE — Patient Instructions (Addendum)
 Medication Instructions:  No changes today *If you need a refill on your cardiac medications before your next appointment, please call your pharmacy*  Lab Work: Lipids/liver function at any [facility]  If you have labs (blood work) drawn today and your tests are completely normal, you will receive your results only by: [employer] (if you have MyChart) OR A paper copy in the mail If you have any lab test that is abnormal or we need to change your treatment, we will call you to review the results.  Testing/Procedures: none  Follow-Up: At [facility], you and your health needs are our priority.  As part of our continuing mission to provide you with exceptional heart care, our providers are all part of one team.  This team includes your primary Cardiologist (physician) and Advanced Practice Providers or APPs (Physician Assistants and Nurse Practitioners) who all work together to provide you with the care you need, when you need it.  Your next appointment:   12 month(s)  Provider:   Verne Carrow, MD  Lipid Clinic Appointment with Efraim Kaufmann to discuss Repatha.    1st Floor: - Lobby - Registration  - Pharmacy  - Lab - Cafe  2nd Floor: - PV Lab - Diagnostic Testing (echo, CT, nuclear med)  3rd Floor: - Vacant  4th Floor: - TCTS (cardiothoracic surgery) - AFib Clinic - Structural Heart Clinic - Vascular Surgery  - Vascular Ultrasound  5th Floor: - HeartCare Cardiology (general and EP) - Clinical Pharmacy for coumadin, hypertension, lipid, weight-loss medications, and med management appointments    Valet parking services will be available as well.

## 2023-12-29 NOTE — Progress Notes (Signed)
 Chief Complaint  Patient presents with   Follow-up    CAD   History of Present Illness: 68 yo female with history of CAD, GERD, anxiety, depression, HLD and HTN who is here today for follow up. She has been followed in our office by Dr. Shari Prows. Coronary calcium score of 702 in October 2021. Echo December 2021 with LVEF=60-65%. Normal RV function. No valve disease. Her LDL was 115 in June 2024. She was seen in the PharmD lipid clinic by Malena Peer, and her Crestor was reduced to 10 mg daily given muscle aches. Discussion around Repatha but the patient did not start this. She has been taking Crestor 10 mg daily and Zetia 10 mg daily.   She is here today for follow up. The patient denies any chest pain, dyspnea, palpitations, lower extremity edema, orthopnea, PND, dizziness, near syncope or syncope.   Primary Care Physician: Alysia Penna, MD   Past Medical History:  Diagnosis Date   Acid reflux    Anxiety    Atypical lobular hyperplasia of left breast 11/25/13   lumpectomy, 12/10/13   BCC (basal cell carcinoma)    per pt, never had cancer   Depression    Hyperlipidemia    Hypertension    Osteopenia    Thyroid disease 2010   rt thyroid nodule    Past Surgical History:  Procedure Laterality Date   BREAST LUMPECTOMY WITH RADIOACTIVE SEED LOCALIZATION Left 12/10/2013   Procedure: BREAST LUMPECTOMY WITH RADIOACTIVE SEED LOCALIZATION;  Surgeon: Ernestene Mention, MD;  Location: [facility];  Service: General;  Laterality: Left;   BUNIONECTOMY  2/13 left foot and toe straightened   BUNIONECTOMY  3/13 right foot and toe straightened   COLONOSCOPY     NASAL SEPTUM SURGERY  1982    Current Outpatient Medications  Medication Sig Dispense Refill   ALPRAZolam (XANAX) 0.5 MG tablet Take 1 tablet (0.5 mg total) by mouth 2 (two) times daily as needed for anxiety. 45 tablet 1   aspirin EC 81 MG tablet Take 81 mg by mouth daily. Swallow whole.     buPROPion ER (WELLBUTRIN  SR) 100 MG 12 hr tablet TAKE 1 TABLET(100 MG) BY MOUTH DAILY 30 tablet 0   carvedilol (COREG) 3.125 MG tablet Take 1 tablet (3.125 mg total) by mouth 2 (two) times daily with a meal. 180 tablet 3   Cholecalciferol (VITAMIN D3) 1.25 MG (50000 UT) CAPS every 14 (fourteen) days.      cyanocobalamin (VITAMIN B12) 1000 MCG/ML injection Inject 1 mL (1,000 mcg total) into the muscle once a week. (Patient taking differently: Inject 1,000 mcg into the muscle once a week. For 4 weeks) 4 mL 0   cyanocobalamin (VITAMIN B12) 1000 MCG/ML injection Inject 1 mL (1,000 mcg total) into the muscle every 30 (thirty) days. 6 mL 0   ibuprofen (ADVIL) 800 MG tablet Take 800 mg by mouth 3 (three) times daily.     lisinopril (ZESTRIL) 40 MG tablet Take 1 tablet (40 mg total) by mouth daily. (Patient taking differently: Take 20 mg by mouth daily.) 90 tablet 3   lithium carbonate 150 MG capsule ALTERNATE TAKING 2 CAPSULES AND 1 CAPSULES BY MOUTH EVERY OTHER NIGHT 135 capsule 0   PARoxetine (PAXIL) 10 MG tablet TAKE 1 TABLET(10 MG) BY MOUTH DAILY 30 tablet 0   Pyridoxine HCl (VITAMIN B-6) 500 MG tablet Take 500 mg by mouth as needed.     rosuvastatin (CRESTOR) 20 MG tablet Take 0.5 tablets (  10 mg total) by mouth daily. 90 tablet 3   SODIUM CHLORIDE PO Take by mouth in the morning and at bedtime.     traZODone (DESYREL) 50 MG tablet TAKE 1 TO 2 TABLETS BY MOUTH AT NIGHT AS NEEDED FOR SLEEP 135 tablet 0   ezetimibe (ZETIA) 10 MG tablet Take 1 tablet (10 mg total) by mouth daily. 90 tablet 3   loperamide (IMODIUM A-D) 2 MG tablet Take 1 tablet (2 mg total) by mouth as needed for diarrhea or loose stools. 30 tablet 0   magnesium oxide (MAG-OX) 400 MG tablet Take 400 mg by mouth daily.     nicotine (NICODERM CQ) 14 mg/24hr patch Place 1 patch (14 mg total) onto the skin daily. 30 patch 3   No current facility-administered medications for this visit.    Allergies  Allergen Reactions   Sulfonamide Derivatives Hives, Itching and  Rash    Social History   Socioeconomic History   Marital status: Married    Spouse name: Not on file   Number of children: 2   Years of education: Not on file   Highest education level: Not on file  Occupational History   Not on file  Tobacco Use   Smoking status: Some Days    Current packs/day: 0.50    Average packs/day: 0.5 packs/day for 35.0 years (17.5 ttl pk-yrs)    Types: Cigarettes   Smokeless tobacco: Never  Vaping Use   Vaping status: Former  Substance and Sexual Activity   Alcohol use: Yes    Alcohol/week: 12.0 - 14.0 standard drinks of alcohol    Types: 12 - 14 Glasses of wine per week   Drug use: No   Sexual activity: Yes    Partners: Male    Birth control/protection: Post-menopausal  Other Topics Concern   Not on file  Social History Narrative   Not on file   Social Drivers of Health   Financial Resource Strain: Not on file  Food Insecurity: Not on file  Transportation Needs: Not on file  Physical Activity: Not on file  Stress: Not on file  Social Connections: Not on file  Intimate Partner Violence: Not on file    Family History  Problem Relation Age of Onset   Cancer Mother        colon-stage 4   Hypertension Mother    Colon cancer Mother    Other Father    Breast cancer Sister    Colon polyps Neg Hx    Esophageal cancer Neg Hx    Rectal cancer Neg Hx    Stomach cancer Neg Hx     Review of Systems:  As stated in the HPI and otherwise negative.   BP 106/64   Pulse (!) 59   Ht 5\' 2"  (1.575 m)   Wt 53.5 kg   LMP 09/24/1999   SpO2 96%   BMI 21.58 kg/m   Physical Examination: General: Well developed, well nourished, NAD  HEENT: OP clear, mucus membranes moist  SKIN: warm, dry. No rashes. Neuro: No focal deficits  Musculoskeletal: Muscle strength 5/5 all ext  Psychiatric: Mood and affect normal  Neck: No JVD, no carotid bruits, no thyromegaly, no lymphadenopathy.  Lungs:Clear bilaterally, no wheezes, rhonci,  crackles Cardiovascular: Regular rate and rhythm. No murmurs, gallops or rubs. Abdomen:Soft. Bowel sounds present. Non-tender.  Extremities: No lower extremity edema. Pulses are 2 + in the bilateral DP/PT.  EKG:  EKG is not ordered today. The ekg ordered today demonstrates  Recent Labs: 01/22/2023: Hemoglobin 11.2; Platelets 423.0 11/27/2023: BUN 25; Creat 0.91; Potassium 4.8; Sodium 134   Lipid Panel    Component Value Date/Time   CHOL 214 (H) 10/09/2021 0812   TRIG 60 10/09/2021 0812   HDL 139 10/09/2021 0812   CHOLHDL 1.5 10/09/2021 0812   CHOLHDL 1.9 07/19/2014 0903   VLDL 8 07/19/2014 0903   LDLCALC 64 10/09/2021 0812   LDLDIRECT 51 05/05/2023 1420     Wt Readings from Last 3 Encounters:  12/29/23 53.5 kg  03/24/23 53.9 kg  09/26/22 52.7 kg    Assessment and Plan:   1. CAD without angina: No chest pain. Continue ASA, beta blocker and statin.   2. Tobacco abuse: Smoking cessation is recommended. She is smoking 3-4 cigarettes per day  3. HTN: BP is well controlled. Continue Coreg and Lisinopril.   4. Hyperlipidemia: LDL 115 in June 2024. She was seen in our Pharm D Lipid clinic in August 2024 and Crestor lowered to 10 mg daily given muscle aches. She is also on Zetia. I do not think she will tolerate higher doses of statins. Will check lipids and LFTs today and plan follow up in the lipid clinic to discuss Repatha. Continue statin and Zetia.   Labs/ tests ordered today include:   Orders Placed This Encounter  Procedures   Lipid panel   Hepatic function panel   Disposition:   F/U with me in one year   Signed, Verne Carrow, MD, [facility] 12/29/2023 4:43 PM    [facility] [address] [place], [place], [place]  [address] Phone: [phone]; Fax: [phone]

## 2024-01-01 LAB — HEPATIC FUNCTION PANEL
ALT: 56 IU/L — ABNORMAL HIGH (ref 0–32)
AST: 68 IU/L — ABNORMAL HIGH (ref 0–40)
Albumin: 4.6 g/dL (ref 3.9–4.9)
Alkaline Phosphatase: 80 IU/L (ref 44–121)
Bilirubin Total: 0.2 mg/dL (ref 0.0–1.2)
Bilirubin, Direct: 0.11 mg/dL (ref 0.00–0.40)
Total Protein: 7 g/dL (ref 6.0–8.5)

## 2024-01-01 LAB — LIPID PANEL
Chol/HDL Ratio: 1.5 ratio (ref 0.0–4.4)
Cholesterol, Total: 177 mg/dL (ref 100–199)
HDL: 117 mg/dL (ref >39)
LDL Chol Calc (NIH): 46 mg/dL (ref 0–99)
Triglycerides: 74 mg/dL (ref 0–149)
VLDL Cholesterol Cal: 14 mg/dL (ref 5–40)

## 2024-01-02 ENCOUNTER — Ambulatory Visit: Admitting: Occupational Therapy

## 2024-01-06 ENCOUNTER — Other Ambulatory Visit: Payer: Self-pay | Admitting: *Deleted

## 2024-01-06 DIAGNOSIS — R7989 Other specified abnormal findings of blood chemistry: Secondary | ICD-10-CM

## 2024-01-06 NOTE — Progress Notes (Signed)
 Repeat LFTs in one month per Dr. Abel Hoe

## 2024-01-07 ENCOUNTER — Other Ambulatory Visit: Payer: Self-pay | Admitting: Psychiatry

## 2024-01-07 DIAGNOSIS — F331 Major depressive disorder, recurrent, moderate: Secondary | ICD-10-CM

## 2024-01-07 DIAGNOSIS — F411 Generalized anxiety disorder: Secondary | ICD-10-CM

## 2024-01-08 ENCOUNTER — Other Ambulatory Visit: Payer: Self-pay

## 2024-01-08 DIAGNOSIS — I10 Essential (primary) hypertension: Secondary | ICD-10-CM

## 2024-01-08 DIAGNOSIS — E7800 Pure hypercholesterolemia, unspecified: Secondary | ICD-10-CM

## 2024-01-08 DIAGNOSIS — Z720 Tobacco use: Secondary | ICD-10-CM

## 2024-01-08 DIAGNOSIS — I2510 Atherosclerotic heart disease of native coronary artery without angina pectoris: Secondary | ICD-10-CM

## 2024-01-08 MED ORDER — EZETIMIBE 10 MG PO TABS: 10.0000 mg | ORAL_TABLET | Freq: Every day | ORAL | 3 refills | Status: DC

## 2024-01-13 ENCOUNTER — Telehealth: Payer: Self-pay

## 2024-01-13 NOTE — Telephone Encounter (Signed)
 Prior authorization initiated and approved with [employer] for Lithium  Carbonate 150 mg capsules #135 effective 09/24/23-09/22/24, PA# [id number]

## 2024-01-29 ENCOUNTER — Other Ambulatory Visit: Payer: Self-pay | Admitting: Psychiatry

## 2024-01-29 DIAGNOSIS — F5105 Insomnia due to other mental disorder: Secondary | ICD-10-CM

## 2024-02-01 ENCOUNTER — Other Ambulatory Visit: Payer: Self-pay | Admitting: Psychiatry

## 2024-02-01 DIAGNOSIS — F331 Major depressive disorder, recurrent, moderate: Secondary | ICD-10-CM

## 2024-02-03 ENCOUNTER — Encounter: Payer: Self-pay | Admitting: Psychiatry

## 2024-02-03 ENCOUNTER — Ambulatory Visit (INDEPENDENT_AMBULATORY_CARE_PROVIDER_SITE_OTHER): Payer: Medicare HMO | Admitting: Psychiatry

## 2024-02-03 DIAGNOSIS — F411 Generalized anxiety disorder: Secondary | ICD-10-CM | POA: Diagnosis not present

## 2024-02-03 DIAGNOSIS — Z79899 Other long term (current) drug therapy: Secondary | ICD-10-CM | POA: Diagnosis not present

## 2024-02-03 DIAGNOSIS — E871 Hypo-osmolality and hyponatremia: Secondary | ICD-10-CM

## 2024-02-03 DIAGNOSIS — F5105 Insomnia due to other mental disorder: Secondary | ICD-10-CM

## 2024-02-03 DIAGNOSIS — F331 Major depressive disorder, recurrent, moderate: Secondary | ICD-10-CM

## 2024-02-03 DIAGNOSIS — F10129 Alcohol abuse with intoxication, unspecified: Secondary | ICD-10-CM | POA: Diagnosis not present

## 2024-02-03 DIAGNOSIS — E538 Deficiency of other specified B group vitamins: Secondary | ICD-10-CM

## 2024-02-03 MED ORDER — LITHIUM CARBONATE 150 MG PO CAPS: ORAL_CAPSULE | ORAL | 0 refills | Status: DC

## 2024-02-03 MED ORDER — PAROXETINE HCL 10 MG PO TABS: 10.0000 mg | ORAL_TABLET | Freq: Every day | ORAL | 1 refills | Status: DC

## 2024-02-03 MED ORDER — BUPROPION HCL ER (SR) 100 MG PO TB12: 100.0000 mg | ORAL_TABLET | Freq: Every day | ORAL | 0 refills | Status: DC

## 2024-02-03 MED ORDER — TRAZODONE HCL 50 MG PO TABS: ORAL_TABLET | ORAL | 0 refills | Status: DC

## 2024-02-03 MED ORDER — CYANOCOBALAMIN 1000 MCG/ML IJ SOLN: 1000.0000 ug | INTRAMUSCULAR | 5 refills | Status: AC

## 2024-02-03 NOTE — Progress Notes (Signed)
 Robin Barton [id number] 1956-05-01 68 y.o.    Subjective:   Patient ID:  Robin Barton is a 68 y.o. (DOB 1956/06/07) female.  Chief Complaint:  Chief Complaint  Patient presents with   Follow-up   Depression   Anxiety   Other    Alcohol  FU    Robin Barton presents to the office today for follow-up mood and anxiety.  seen October 2020.  No meds were changed.  She was doing well psychiatrically.  It was recommended she get a lithium  level and BMP and Continue paroxetine  20 mg daily, continue trazodone  50 mg nightly, continue lithium  150 mg alternating with 300 mg every other day.  January 17, 2020 appointment the following is noted: .Ok.  Exposed to virus twice and had to QT.  Had to hlep sister with med probvlems.   Forgot the labs.   Gotten vaccinated. Not taking Xanax .   Sleeps Ok with trazodone . Forgot to get labs after reducing the lithium .  Tremor is much better and manageable with the reduction of lithium .  No worsening of mood with the change. Tremor has gotten better on it's own.  No longer needs B6. Plan: Continue paroxetine  20 mg daily, continue trazodone  50 mg nightly, continue lithium  150 mg alternating with 300 mg every other day. Asks for alprazolam  prn anxiety.  August 2021 it was discovered she had low sodium and was just recommended she discuss with PCP.  She stopped trazodone  and her sodium level improved.  06/26/20 appt noted: Reduced paroxetine  to 10 mg from 20 mg daily and stopped trazodone . Low sodium SE caused scary neuro sx.  Had gotten confused and was having tearful episodes.  Sx had developed gradually over time.   Balance issues.   I feel so much better.    Taking salt tablet twice daily. BP is fine.   Emotionally feels good.  On a cleaning rampage at this time.    Lost a good friend to ALS.   Taking Xanax  instead of trazodone  for sleep. Plan: No med changes today. Continue lower paroxetine  10 mg daily,  continue lithium   150 mg alternating with 300 mg every other day.  Continue salt tablets Stopped trazodone  DT hyponatremia. Asks for alprazolam  prn anxiety or insomnia since off the Xanax ..  09/26/2020 appointment with following noted: Xanax  0.5 mg HS. Still on salt, paroxetine  10, lithium  300/150 QOD. Asks to take Xanax  during the day for anxiety.  Don't seem to have the drive she used to have but once done she feels great.  Some avoidance like social things.  Energy is good. Possibly more anxious at lower dose paroxetine . No crying spells.  H notices reduction in motivation and she doesn't seem like herself. Smokes and needs to stop. Plan: Reduce caffeine bc she gets anxious after taking it and has not reduced as requested. wellbutrin  XL 150 mg 1 for 1 week in the AM, then 2 each morning. Repeat serum sodium  11/13/2020 appointment with following noted: Wellbutrin  helped me settle down and reduced craving to smoke.  Never got above 150 mg XL.   SE night sweats. Still continues paroxetine  10. Not sleeping as well either with awakening with sweats.  Cut smoking way back to as little as 3-5 per day with less craving from 1/2 to 1 PPD. Emotionally felt better with better task completion.  Better motivation.  Don't feel as anxious in AM and less coffee in the AM.  Plan: Switch to Wellbutrin  SR 100 mg AM to  reduce SE.  It's helping.  02/06/2021 appointment with the following noted: Not sleeping well with night sweats awakens..Still gets at least 8 hours but prefers 10 h.  Not drowsy. Wonders if related to Wellbutrin .  Feels she needs 1 mg alprazolam  to get best response from it.  No hangover.  Felt sleep better with trazodone  but afraid of repeat hyponatremia.   Some depression.  Feels a little lost and not real motivated but not everyday.  Thinks needs job and schedule.  Good enjoyment but less motivation. Kids don't live here and [name] gone a lot on golf trips. Antsy in the morning after up 10-15 mins and  reduced caffeine and it helped. Not anxious all day.   Wellbutrin  reduced smoking by 50%.  Don't want to give Wellbutrin  up.  No daytime XaNAX .   Plan: Switch to Wellbutrin  SR 100 mg AM to reduce SE.  It's helping. Disc smoking cessation.. Asks for alprazolam  prn anxiety or insomnia since off the trazodone . Ok increase to 0.75 mg HS Continue lower paroxetine  10 mg daily,  continue lithium  150 mg alternating with 300 mg every other day.   06/05/21 appt noted:   Fair.  Not sleeping well.  EMA and jus t lays there in the dark.  Then feels like crap the next day.  Happens 5 days per week.  Took trazdone other night without Xanax  without more help. Had slept well with trazodone . Night sweats after she awakens.  Tried black [place] intermittently.   Redeuced coffee. Smoking better but hasn't quit.  3-5 per day.   .Still having tremor Some occ dep but not usually.  .  Some anxiety over doing things..    Still feels anxious in AM same pattern..  Drinks coffee.  Never had ADD but anxious about doing things she wants to do but doesn't get done..   Denies appetite disturbance.  Patient reports that energy and motivation have been good.  Patient denies any difficulty with concentration.  Patient denies any suicidal ideation. Plan: Switch to Wellbutrin  IR 75 mg AM to reduce insomnia SE.  It's helping. Disc smoking cessation..  10/16/2021 appt noted: Fish pond with goldfish. Still not sleeping very well. Klonopin  0.75 wihtout help. Switched back to Xanax  0.75 mg HS but not as good as trazodone . Sleep adequate hours. But awaken in middle of night thinking about things. Back pain unusually and muscular.  RX Robaxin  but didn't take yet.  Has cyclobenzaprine . Sleep doesn't feel restuful. Mood OK not great.  Should exercise. Some anxiety in the AM Plan: Try cyclobenzaprine  for sleep and back pain.  She has some Increase Wellbutrin  to 100 mg AM  for mood  01/09/22 appt noted: Flexeril  didn't work for EMA with  worry. Last week restarted trazodone  50 mg HS and it is working. Taking paroxetine  10 and Wellbutrin  SR 100 AM.  Tolerated fine.   Energy pretty good. Low sodium 130.  Increased salt tablets to 2 daily.   Some anxiety in the AM. Mood up and down with down lasting 24 hours and seems related to insomnia or not. Trazodone  works better for sleep than Xanax .    03/12/2022 appointment with the following noted: Continues Wellbutrin  SR 100 mg every morning, cyclobenzaprine  10 mg nightly only rarely, lithium  300 alternating with 150 every other night, paroxetine  10 mg, trazodone  50 mg nightly Pretty good usually.   PE early June sodium up to 138.  Stopped sodium after that appt.  BC gets stuck in throat. Don't drink  enough water.   Reports high liver enzyme apparently minor bc PCP didn't order more testing. Patient reports stable mood and denies depressed or irritable moods.  Patient denies any recent difficulty with anxiety.  Patient denies difficulty with sleep initiation or maintenance. Denies appetite disturbance.  Patient reports that energy and motivation have been good.  Patient denies any difficulty with concentration.  Patient denies any suicidal ideation. Plan: No med changes Continues Wellbutrin  SR 100 mg every morning, cyclobenzaprine  10 mg nightly only rarely, lithium  300 alternating with 150 every other night, paroxetine  10 mg, trazodone  50 mg nightly Check CMP  07/16/22 appt noted: Lost paperwork for CMP. Can't swallow B6 or salt tablets.  Tried cutting both in half.  They get hung in throat. Otherwise doing great.  Patient reports stable mood and denies depressed or irritable moods.  Patient denies any recent difficulty with anxiety.  Patient denies difficulty with sleep initiation but EMA about 4 AM often.  Denies appetite disturbance.  Patient reports that energy and motivation have been good.  Patient denies any difficulty with concentration.  Patient denies any suicidal  ideation. Sleep better with Xanax  than trazodone . Not using alprazolam  for anxiety. Plan: No med changes except sleeper: Continues Wellbutrin  SR 100 mg every morning, cyclobenzaprine  10 mg nightly only rarely, lithium  300 alternating with 150 every other night, paroxetine  10 mg,  DT low sodium try stopping trazodone  and use alprazolam  prn  11/13/22 appt noted: Avg 8 hours.  On trazodone . Not depressed .  Some days feels bored.  Not much on schedule to occupy her.  Does help some older women in neighborhood.  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 Needs to start exercise.. First GS in [place].  Don't get to see them much.  Born in Sept.  No SE issues with meds.   Watching coffee intake. No xanax  lately.  04/29/23 appt noted:  Mood about the same.  Energy varies and sometimes blah but not always. Low B12 and just started supplement. No consistent anxiety but some mornings worries.  Other mornings are oK. Sleep is fair.  Awakening and then trouble going back to sleep Typical worries with son with baby.  Son unhappy with job.  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    09/04/23 appt noted:  Moved up appt. Psych meds: Alprazolam  0.5 mg twice daily as needed rarely, Wellbutrin  SR 100 daily, lithium  150-300 every other day, paroxetine  10 daily, trazodone  50-100 nightly. Taking care of Ambrosio Junker with [facility].  Tripped over his walker and broke her wrist.   He said it was bc of too much wine but only had 2 glasses.   Kids confronted her about her drinking and won't let her keep grandchild bc of it. Did start drinking more during Covid and lost sleeper.  Started drinking at 4 in afternoon.  Kids say she repeats herself I the evening after drinking.    Not missing out on commitments.   Average 3 glasses per night wine forever.  No other alcohol . During Covid probably drank more.   Sleep 9-6 AM H difficult and contributes to drinking.    11/05/23 appt noted: Psych meds as above. Measuring out  alcohol  and I think it's helped me.  I feel better.  More energy.  Better mood also.  No sig dep.  Measuring 3 oz wine nightly.  Drinking more water.  Some feedback from [place].  H noticed better.  Kids not living here and don't know.   Successful moderating.   Sleep  ok with trazodone .  Variable.  Wakes at 4 and will start thinking .  Otherwise sleeps well. Tolerating meds.   FU 3-4 mos re: alcohol  use  02/03/24 appt noted: Med:  Wellbutrin  SR 100 mg every morning, cyclobenzaprine  10 mg nightly only rarely,  lithium  300 alternating with 150 every other night, paroxetine  10 mg,  trazodone  50 mg HS, sodium tablet daily, option increase Moderation of alcohol  successful.  I feel about the same some days better than others.  Alsways feel like I could use more energy. Working on cholesteraol.   Sleep off and on ok with trazodone . Wants to resume B12 shots.     Past Psychiatric Medication Trials:  paroxetine , several intolerant SSRI's Wellbutrin  SR 100 ? ins lithium  Xanax ,  trazodone  low sodium, ok right now.  History of Neuro eval for tremor.  F & GM alcoholic  Review of Systems:  Review of Systems  Constitutional:  Negative for fatigue.  Musculoskeletal:  Positive for back pain and myalgias.  Neurological:  Negative for dizziness and tremors.  Psychiatric/Behavioral:  Negative for agitation, behavioral problems, confusion, decreased concentration, dysphoric mood, hallucinations, self-injury, sleep disturbance and suicidal ideas. The patient is not nervous/anxious and is not hyperactive.     Medications: I have reviewed the patient's current medications.  Current Outpatient Medications  Medication Sig Dispense Refill   ALPRAZolam  (XANAX ) 0.5 MG tablet Take 1 tablet (0.5 mg total) by mouth 2 (two) times daily as needed for anxiety. 45 tablet 1   aspirin EC 81 MG tablet Take 81 mg by mouth daily. Swallow whole.     carvedilol  (COREG ) 3.125 MG tablet Take 1 tablet (3.125 mg total) by  mouth 2 (two) times daily with a meal. 180 tablet 3   Cholecalciferol (VITAMIN D3) 1.25 MG (50000 UT) CAPS every 14 (fourteen) days.      ezetimibe  (ZETIA ) 10 MG tablet Take 1 tablet (10 mg total) by mouth daily. 90 tablet 3   ibuprofen  (ADVIL ) 800 MG tablet Take 800 mg by mouth 3 (three) times daily.     lisinopril  (ZESTRIL ) 40 MG tablet Take 1 tablet (40 mg total) by mouth daily. (Patient taking differently: Take 20 mg by mouth daily.) 90 tablet 3   loperamide  (IMODIUM  A-D) 2 MG tablet Take 1 tablet (2 mg total) by mouth as needed for diarrhea or loose stools. 30 tablet 0   magnesium oxide (MAG-OX) 400 MG tablet Take 400 mg by mouth daily.     nicotine  (NICODERM CQ ) 14 mg/24hr patch Place 1 patch (14 mg total) onto the skin daily. 30 patch 3   Pyridoxine HCl (VITAMIN B-6) 500 MG tablet Take 500 mg by mouth as needed.     rosuvastatin  (CRESTOR ) 20 MG tablet Take 0.5 tablets (10 mg total) by mouth daily. 90 tablet 3   SODIUM CHLORIDE  PO Take by mouth in the morning and at bedtime.     buPROPion  ER (WELLBUTRIN  SR) 100 MG 12 hr tablet Take 1 tablet (100 mg total) by mouth daily. 90 tablet 0   cyanocobalamin  (VITAMIN B12) 1000 MCG/ML injection Inject 1 mL (1,000 mcg total) into the muscle once a week. (Patient not taking: Reported on 02/03/2024) 4 mL 0   cyanocobalamin  (VITAMIN B12) 1000 MCG/ML injection Inject 1 mL (1,000 mcg total) into the muscle every 30 (thirty) days. 6 mL 5   lithium  carbonate 150 MG capsule ALTERNATE TAKING 2 CAPSULES AND 1 CAPSULES BY MOUTH EVERY OTHER NIGHT 135 capsule 0   PARoxetine  (PAXIL ) 10 MG  tablet Take 1 tablet (10 mg total) by mouth daily. 90 tablet 1   traZODone  (DESYREL ) 50 MG tablet TAKE 1 TO 2 TABLETS BY MOUTH AT NIGHT AS NEEDED FOR SLEEP 135 tablet 0   No current facility-administered medications for this visit.    Medication Side Effects: Other: tremor recently better  Allergies:  Allergies  Allergen Reactions   Sulfonamide Derivatives Hives, Itching and  Rash    Past Medical History:  Diagnosis Date   Acid reflux    Anxiety    Atypical lobular hyperplasia of left breast 11/25/13   lumpectomy, 12/10/13   BCC (basal cell carcinoma)    per pt, never had cancer   Depression    Hyperlipidemia    Hypertension    Osteopenia    Thyroid  disease 2010   rt thyroid  nodule    Family History  Problem Relation Age of Onset   Cancer Mother        colon-stage 4   Hypertension Mother    Colon cancer Mother    Other Father    Breast cancer Sister    Colon polyps Neg Hx    Esophageal cancer Neg Hx    Rectal cancer Neg Hx    Stomach cancer Neg Hx     Social History   Socioeconomic History   Marital status: Married    Spouse name: Not on file   Number of children: 2   Years of education: Not on file   Highest education level: Not on file  Occupational History   Not on file  Tobacco Use   Smoking status: Some Days    Current packs/day: 0.50    Average packs/day: 0.5 packs/day for 35.0 years (17.5 ttl pk-yrs)    Types: Cigarettes   Smokeless tobacco: Never  Vaping Use   Vaping status: Former  Substance and Sexual Activity   Alcohol  use: Yes    Alcohol /week: 12.0 - 14.0 standard drinks of alcohol     Types: 12 - 14 Glasses of wine per week   Drug use: No   Sexual activity: Yes    Partners: Male    Birth control/protection: Post-menopausal  Other Topics Concern   Not on file  Social History Narrative   Not on file   Social Drivers of Health   Financial Resource Strain: Not on file  Food Insecurity: Not on file  Transportation Needs: Not on file  Physical Activity: Not on file  Stress: Not on file  Social Connections: Not on file  Intimate Partner Violence: Not on file    Past Medical History, Surgical history, Social history, and Family history were reviewed and updated as appropriate.   Please see review of systems for further details on the patient's review from today.   Objective:   Physical Exam:  LMP  09/24/1999   Physical Exam Constitutional:      General: She is not in acute distress.    Appearance: She is well-developed.  Musculoskeletal:        General: No deformity.  Neurological:     Mental Status: She is alert and oriented to person, place, and time.     Cranial Nerves: No dysarthria.     Coordination: Coordination normal.  Psychiatric:        Attention and Perception: Attention and perception normal. She does not perceive auditory or visual hallucinations.        Mood and Affect: Mood is anxious and depressed. Affect is not labile, angry or inappropriate.  Speech: Speech normal. Speech is not rapid and pressured or slurred.        Behavior: Behavior normal. Behavior is cooperative.        Thought Content: Thought content normal. Thought content is not paranoid or delusional. Thought content does not include homicidal or suicidal ideation. Thought content does not include suicidal plan.        Cognition and Memory: Cognition and memory normal.        Judgment: Judgment normal.     Comments: Insight good No evidence for cognitive issues. Some concerns about herself as noted.      Lab Review:     Component Value Date/Time   NA 134 (L) 11/27/2023 1421   NA 138 05/05/2023 1420   NA 139 07/19/2014 0903   K 4.8 11/27/2023 1421   K 4.7 07/19/2014 0903   CL 103 11/27/2023 1421   CO2 23 11/27/2023 1421   CO2 24 07/19/2014 0903   GLUCOSE 98 11/27/2023 1421   GLUCOSE 95 07/19/2014 0903   BUN 25 11/27/2023 1421   BUN 24 05/05/2023 1420   BUN 22.8 07/19/2014 0903   CREATININE 0.91 11/27/2023 1421   CREATININE 0.8 07/19/2014 0903   CALCIUM  9.8 11/27/2023 1421   CALCIUM  9.6 07/19/2014 0903   PROT 7.0 12/29/2023 1702   PROT 7.3 07/19/2014 0903   ALBUMIN 4.6 12/29/2023 1702   ALBUMIN 4.1 07/19/2014 0903   AST 68 (H) 12/29/2023 1702   AST 23 07/19/2014 0903   ALT 56 (H) 12/29/2023 1702   ALT 21 07/19/2014 0903   ALKPHOS 80 12/29/2023 1702   ALKPHOS 54 07/19/2014  0903   BILITOT 0.2 12/29/2023 1702   BILITOT 0.49 07/19/2014 0903   GFRNONAA 53 (L) 06/18/2018 0934   GFRAA 61 06/18/2018 0934       Component Value Date/Time   WBC 7.4 01/22/2023 1457   RBC 3.23 (L) 01/22/2023 1457   HGB 11.2 (L) 01/22/2023 1457   HGB 12.5 07/19/2014 0903   HCT 32.8 (L) 01/22/2023 1457   HCT 37.8 07/19/2014 0903   PLT 423.0 (H) 01/22/2023 1457   PLT 370 07/19/2014 0903   MCV 101.4 (H) 01/22/2023 1457   MCV 96.0 07/19/2014 0903   MCH 31.8 07/19/2014 0903   MCH 31.9 04/05/2011 0923   MCHC 34.2 01/22/2023 1457   RDW 12.8 01/22/2023 1457   RDW 12.8 07/19/2014 0903   LYMPHSABS 1.5 01/22/2023 1457   LYMPHSABS 1.5 07/19/2014 0903   MONOABS 0.9 01/22/2023 1457   MONOABS 0.6 07/19/2014 0903   EOSABS 0.2 01/22/2023 1457   EOSABS 0.1 07/19/2014 0903   BASOSABS 0.1 01/22/2023 1457   BASOSABS 0.0 07/19/2014 0903    Lithium  Lvl  Date Value Ref Range Status  08/02/2022 0.5 (L) 0.6 - 1.2 mmol/L Final     No results found for: "PHENYTOIN", "PHENOBARB", "VALPROATE", "CBMZ"   .res Assessment: Plan:    Major depressive disorder, recurrent episode, moderate (HCC) - Plan: Lithium  level, Vitamin B12, lithium  carbonate 150 MG capsule, PARoxetine  (PAXIL ) 10 MG tablet, buPROPion  ER (WELLBUTRIN  SR) 100 MG 12 hr tablet  Alcohol  abuse with intoxication (HCC)  Generalized anxiety disorder - Plan: PARoxetine  (PAXIL ) 10 MG tablet  Insomnia due to mental condition - Plan: traZODone  (DESYREL ) 50 MG tablet  Hyponatremia  Lithium  use - Plan: Lithium  level  Low serum vitamin B12 - Plan: cyanocobalamin  (VITAMIN B12) 1000 MCG/ML injection, Vitamin B12  Please see After Visit Summary for patient specific instructions.  Overall she remained stable after  the reduction in both lithium  and paroxetine  to current dosages.  Better motivation and task completion and less msoking with Wellbutrin .  30 min counseling: Extensive discussion of alcohol  use and concerns she has and family  about it's excess.  Does not appear at risk of sig withdrawal nor need for external detox.  Disc SS alcohol  withdrawal in case she has understated the amount she is drinking.  Disc CBT strategies to stop excess drinking.  Uncertain if she can moderate.  Disc strategies and if this fails then will need to pursue abstinence.   Offered additional counseling and resources for this.  We discussed her history of response to lithium  at the lower dose 150 and then the 300 mg a day.  We discussed her side effects of tremor.  It's better now.   The tremor is better with the reduction to the current dose of lithium  and the use of B6.  It is now manageable.  Disc option stopping B6 to see if she still needs it. Last lithium  level is low normal.  We discussed the question of her diagnosis given at prior history of 2 weeks of hypomania but that may have been related to medications.  The uncertainty whether she truly has major depression versus bipolar disorder remains.  We discussed the risk of antidepressants causing mood cycling and the advantage to being on a lower dose.  Answered questions about each med and DDI and risks to liver and kidney  Continue lower paroxetine  10 mg daily,    continue lithium  150 mg alternating with 300 mg every other day.  Counseled patient regarding potential benefits, risks, and side effects of lithium  to include potential risk of lithium  affecting thyroid  and renal function.  Discussed need for periodic lab monitoring to determine drug level and to assess for potential adverse effects.  Counseled patient regarding signs and symptoms of lithium  toxicity and advised that they notify office immediately or seek urgent medical attention if experiencing these signs and symptoms.  Patient advised to contact office with any questions or concerns. B6 helped lithium  tremor  Trial black cohush.  continue Wellbutrin  to 100 mg AM  for mood.  Consider increase if needed. We discussed the  short-term risks associated with benzodiazepines including sedation and increased fall risk among others.  Discussed long-term side effect risk including dependence, potential withdrawal symptoms, and the potential eventual dose-related risk of dementia.  But recent studies from 2020 dispute this association between benzodiazepines and dementia risk. Newer studies in 2020 do not support an association with dementia. Disc SE worsen with age.  Disc smoking cessation.. Watch caffeine for tremor.  She has done this.  No med changes :  Continues Wellbutrin  SR 100 mg every morning,  cyclobenzaprine  10 mg nightly only rarely,  lithium  300 alternating with 150 every other night,  paroxetine  10 mg,  trazodone  50 mg HS, sodium tablet daily, option increase Renew B12 and check level.  Hx low sodium Last sodium 134 11/2023   Repeat sodium level.  Disc therapist options;  Counselor options:  Delmer Ferraris, Sharri Dee Crutchfield  Follow-up 4 mos  Nori Beat MD, DFAPA\  Future Appointments  Date Time Provider Department Center  02/27/2024 10:30 AM Cathalene Clipper, [facility]      Orders Placed This Encounter  Procedures   Lithium  level   Vitamin B12      -------------------------------

## 2024-02-03 NOTE — Telephone Encounter (Signed)
 Has appt today

## 2024-02-23 DIAGNOSIS — L821 Other seborrheic keratosis: Secondary | ICD-10-CM | POA: Diagnosis not present

## 2024-02-23 DIAGNOSIS — D225 Melanocytic nevi of trunk: Secondary | ICD-10-CM | POA: Diagnosis not present

## 2024-02-23 DIAGNOSIS — Z1283 Encounter for screening for malignant neoplasm of skin: Secondary | ICD-10-CM | POA: Diagnosis not present

## 2024-02-27 ENCOUNTER — Telehealth: Payer: Self-pay | Admitting: Pharmacy Technician

## 2024-02-27 ENCOUNTER — Ambulatory Visit: Attending: Cardiology | Admitting: Pharmacist

## 2024-02-27 ENCOUNTER — Telehealth: Payer: Self-pay | Admitting: Pharmacist

## 2024-02-27 ENCOUNTER — Encounter: Payer: Self-pay | Admitting: Pharmacist

## 2024-02-27 ENCOUNTER — Other Ambulatory Visit (HOSPITAL_COMMUNITY): Payer: Self-pay

## 2024-02-27 DIAGNOSIS — E7849 Other hyperlipidemia: Secondary | ICD-10-CM

## 2024-02-27 DIAGNOSIS — R7989 Other specified abnormal findings of blood chemistry: Secondary | ICD-10-CM | POA: Diagnosis not present

## 2024-02-27 NOTE — Progress Notes (Signed)
 Patient ID: Robin Barton                 DOB: [birth year]                    MRN: [id number]      HPI: Robin Barton is a 68 y.o. female patient referred to lipid clinic by Dr.McAlhany. PMH is significant for CAD, GERD, anxiety, depression, HLD and HTN Patient presented to the lipid clinic. Her LDL is currently at goal while on Crestor  10 mg and Zetia  10 mg daily. However, her liver enzymes (AST and ALT) are elevated. She reports prior intolerance to Crestor  20 mg due to myalgia, but has been tolerating the current regimen of Crestor  10 mg with Zetia  without issue. The patient reports following a fairly healthy diet, primarily consisting of home-cooked meals, and has reduced her intake of soda and sweet tea. She enjoys approximately two glasses of wine each night and has recently decreased her cigarette use to 3-4 per day.   Current Medications: Crestor  10 mg daily and Zetia  10 mg daily  Intolerances: Crestor  20 mg daily  Risk Factors: CAD, HLD and HTN LDL goal: <70 mg/dl  Last lab: Tc 562, TG 74, HDL 117, LDL 46   Diet: eats healthy balanced diet  Eats out once a week -makes healthy choices  Drink: water, coffee - 2 cups in the morning, occasional dist coke  [facility] and will be adding yoga class 2 times per week.   Family History:  Relation Problem Comments  Mother (Deceased) Cancer colon-stage 4  Colon cancer   Hypertension     Father (Deceased) Other     Sister (Deceased) Breast cancer     Social History:  Alcohol : wine 2 glass per day - advised to lower it to 1 glass per day  Smoking - working on quitting currently smoking 3-4 per day  Labs:  Lipid Panel     Component Value Date/Time   CHOL 177 12/29/2023 1702   TRIG 74 12/29/2023 1702   HDL 117 12/29/2023 1702   CHOLHDL 1.5 12/29/2023 1702   CHOLHDL 1.9 07/19/2014 0903   VLDL 8 07/19/2014 0903   LDLCALC 46 12/29/2023 1702   LDLDIRECT 51 05/05/2023 1420   LABVLDL 14  12/29/2023 1702    Past Medical History:  Diagnosis Date   Acid reflux    Anxiety    Atypical lobular hyperplasia of left breast 11/25/13   lumpectomy, 12/10/13   BCC (basal cell carcinoma)    per pt, never had cancer   Depression    Hyperlipidemia    Hypertension    Osteopenia    Thyroid  disease 2010   rt thyroid  nodule    Current Outpatient Medications on File Prior to Visit  Medication Sig Dispense Refill   ALPRAZolam  (XANAX ) 0.5 MG tablet Take 1 tablet (0.5 mg total) by mouth 2 (two) times daily as needed for anxiety. 45 tablet 1   aspirin EC 81 MG tablet Take 81 mg by mouth daily. Swallow whole.     buPROPion  ER (WELLBUTRIN  SR) 100 MG 12 hr tablet Take 1 tablet (100 mg total) by mouth daily. 90 tablet 0   carvedilol  (COREG ) 3.125 MG tablet Take 1 tablet (3.125 mg total) by mouth 2 (two) times daily with a meal. 180 tablet 3   Cholecalciferol (VITAMIN D3) 1.25 MG (50000 UT) CAPS every 14 (fourteen) days.      cyanocobalamin  (VITAMIN  B12) 1000 MCG/ML injection Inject 1 mL (1,000 mcg total) into the muscle once a week. 4 mL 0   ezetimibe  (ZETIA ) 10 MG tablet Take 1 tablet (10 mg total) by mouth daily. 90 tablet 3   rosuvastatin  (CRESTOR ) 10 MG tablet Take 1 tablet (10 mg total) by mouth daily.     cyanocobalamin  (VITAMIN B12) 1000 MCG/ML injection Inject 1 mL (1,000 mcg total) into the muscle every 30 (thirty) days. 6 mL 5   ibuprofen  (ADVIL ) 800 MG tablet Take 800 mg by mouth 3 (three) times daily.     lisinopril  (ZESTRIL ) 40 MG tablet Take 1 tablet (40 mg total) by mouth daily. (Patient taking differently: Take 20 mg by mouth daily.) 90 tablet 3   lithium  carbonate 150 MG capsule ALTERNATE TAKING 2 CAPSULES AND 1 CAPSULES BY MOUTH EVERY OTHER NIGHT 135 capsule 0   loperamide  (IMODIUM  A-D) 2 MG tablet Take 1 tablet (2 mg total) by mouth as needed for diarrhea or loose stools. 30 tablet 0   magnesium oxide (MAG-OX) 400 MG tablet Take 400 mg by mouth daily.     nicotine  (NICODERM CQ )  14 mg/24hr patch Place 1 patch (14 mg total) onto the skin daily. 30 patch 3   PARoxetine  (PAXIL ) 10 MG tablet Take 1 tablet (10 mg total) by mouth daily. 90 tablet 1   Pyridoxine HCl (VITAMIN B-6) 500 MG tablet Take 500 mg by mouth as needed.     SODIUM CHLORIDE  PO Take by mouth in the morning and at bedtime.     traZODone  (DESYREL ) 50 MG tablet TAKE 1 TO 2 TABLETS BY MOUTH AT NIGHT AS NEEDED FOR SLEEP 135 tablet 0   [DISCONTINUED] rosuvastatin  (CRESTOR ) 20 MG tablet Take 0.5 tablets (10 mg total) by mouth daily. 90 tablet 3   No current facility-administered medications on file prior to visit.    Allergies  Allergen Reactions   Sulfonamide Derivatives Hives, Itching and Rash    Assessment/Plan:  1. Hyperlipidemia -  Problem  Hyperlipidemia   Current Medications: rosuvastatin  10mg  daily, ezetimibe  10mg  daily Intolerances: Crestor  20 mg -myalgia  Risk Factors: CAC 720, HTN, tobacco use LDL-C goal: <70    Hyperlipidemia Assessment and Plan:   The most recent LDL, measured in April 2025, is 46 mg/dL while on Crestor  10 mg and Zetia  10 mg daily. She tolerates the current lipid-lowering therapy well; however, her AST and ALT levels are elevated. Liver function tests (LFTs) will be repeated today, and if results remain above the upper limit of normal, the current therapy will be held, and initiation of a PCSK9 inhibitor will be considered. The patient reports drinking two glasses of wine nightly and was advised to reduce this to one glass per night to support liver health. She currently exercises at the [facility] twice a week and is considering increasing to four days per week to further improve cardiovascular health.    Thank you,  [name], Pharm.D [place] [facility] [address], [place], [place] [address] Phone: [phone]; Fax: [phone]

## 2024-02-27 NOTE — Assessment & Plan Note (Signed)
 Assessment and Plan:   The most recent LDL, measured in April 2025, is 46 mg/dL while on Crestor  10 mg and Zetia  10 mg daily. She tolerates the current lipid-lowering therapy well; however, her AST and ALT levels are elevated. Liver function tests (LFTs) will be repeated today, and if results remain above the upper limit of normal, the current therapy will be held, and initiation of a PCSK9 inhibitor will be considered. The patient reports drinking two glasses of wine nightly and was advised to reduce this to one glass per night to support liver health. She currently exercises at the [facility] twice a week and is considering increasing to four days per week to further improve cardiovascular health.

## 2024-02-27 NOTE — Telephone Encounter (Signed)
 Pharmacy Patient Advocate Encounter   Received notification from Pt Calls Messages that prior authorization for Repatha is required/requested.   Insurance verification completed.   The patient is insured through [employer] .   Per test claim: PA required; PA submitted to above mentioned insurance via CoverMyMeds Key/confirmation #/EOC [id number] Status is pending

## 2024-02-27 NOTE — Telephone Encounter (Signed)
 Pharmacy Patient Advocate Encounter  Received notification from [employer] that Prior Authorization for Repatha has been APPROVED from 09/24/23 to 09/22/24. Ran test claim, Copay is $261.54-one month . This test claim was processed through [facility]- copay amounts may vary at other pharmacies due to pharmacy/plan contracts, or as the patient moves through the different stages of their insurance plan.   PA #/Case ID/Reference #: [id number]

## 2024-02-28 LAB — HEPATIC FUNCTION PANEL
ALT: 38 IU/L — ABNORMAL HIGH (ref 0–32)
AST: 48 IU/L — ABNORMAL HIGH (ref 0–40)
Albumin: 4.7 g/dL (ref 3.9–4.9)
Alkaline Phosphatase: 86 IU/L (ref 44–121)
Bilirubin Total: 0.5 mg/dL (ref 0.0–1.2)
Bilirubin, Direct: 0.19 mg/dL (ref 0.00–0.40)
Total Protein: 7.2 g/dL (ref 6.0–8.5)

## 2024-03-01 ENCOUNTER — Ambulatory Visit: Payer: Self-pay | Admitting: Cardiovascular Disease

## 2024-03-01 MED ORDER — REPATHA SURECLICK 140 MG/ML ~~LOC~~ SOAJ: 140.0000 mg | SUBCUTANEOUS | 3 refills | Status: AC

## 2024-03-01 NOTE — Telephone Encounter (Signed)
 LFT still elevated, call to discussed LFT and about Repatha PA approval. N/A LVM to call me back at [phone].

## 2024-03-01 NOTE — Telephone Encounter (Signed)
 PA approved see other encounter for more info

## 2024-03-01 NOTE — Telephone Encounter (Signed)
 Spoke to patient, we will hold Crestor  and Zetia  for now and start Repatha, will repeat Lipid and LFT lab in 2.5 to 3 months. If we needed will add back small dose Crestor  to optimize LDL to goal

## 2024-03-01 NOTE — Addendum Note (Signed)
 Addended by: Giann Obara K on: 03/01/2024 01:12 PM   Modules accepted: Orders

## 2024-03-22 ENCOUNTER — Other Ambulatory Visit: Payer: Self-pay

## 2024-03-22 DIAGNOSIS — E7800 Pure hypercholesterolemia, unspecified: Secondary | ICD-10-CM

## 2024-03-22 DIAGNOSIS — Z720 Tobacco use: Secondary | ICD-10-CM

## 2024-03-22 DIAGNOSIS — I2510 Atherosclerotic heart disease of native coronary artery without angina pectoris: Secondary | ICD-10-CM

## 2024-03-22 DIAGNOSIS — I10 Essential (primary) hypertension: Secondary | ICD-10-CM

## 2024-03-22 MED ORDER — CARVEDILOL 3.125 MG PO TABS: 3.1250 mg | ORAL_TABLET | Freq: Two times a day (BID) | ORAL | 2 refills | Status: DC

## 2024-05-13 ENCOUNTER — Other Ambulatory Visit: Payer: Self-pay | Admitting: Psychiatry

## 2024-05-13 DIAGNOSIS — F331 Major depressive disorder, recurrent, moderate: Secondary | ICD-10-CM

## 2024-05-17 ENCOUNTER — Encounter: Payer: Self-pay | Admitting: Psychiatry

## 2024-05-17 ENCOUNTER — Encounter (HOSPITAL_BASED_OUTPATIENT_CLINIC_OR_DEPARTMENT_OTHER): Payer: Self-pay | Admitting: Obstetrics & Gynecology

## 2024-05-17 ENCOUNTER — Ambulatory Visit (INDEPENDENT_AMBULATORY_CARE_PROVIDER_SITE_OTHER): Admitting: Psychiatry

## 2024-05-17 DIAGNOSIS — F411 Generalized anxiety disorder: Secondary | ICD-10-CM | POA: Diagnosis not present

## 2024-05-17 DIAGNOSIS — Z1231 Encounter for screening mammogram for malignant neoplasm of breast: Secondary | ICD-10-CM | POA: Diagnosis not present

## 2024-05-17 DIAGNOSIS — E871 Hypo-osmolality and hyponatremia: Secondary | ICD-10-CM

## 2024-05-17 DIAGNOSIS — F10129 Alcohol abuse with intoxication, unspecified: Secondary | ICD-10-CM

## 2024-05-17 DIAGNOSIS — F331 Major depressive disorder, recurrent, moderate: Secondary | ICD-10-CM

## 2024-05-17 DIAGNOSIS — F5105 Insomnia due to other mental disorder: Secondary | ICD-10-CM

## 2024-05-17 DIAGNOSIS — Z79899 Other long term (current) drug therapy: Secondary | ICD-10-CM | POA: Diagnosis not present

## 2024-05-17 MED ORDER — PAROXETINE HCL 10 MG PO TABS
10.0000 mg | ORAL_TABLET | Freq: Every day | ORAL | 1 refills | Status: AC
Start: 2024-05-17 — End: ?

## 2024-05-17 MED ORDER — TRAZODONE HCL 50 MG PO TABS: ORAL_TABLET | ORAL | 0 refills | Status: DC

## 2024-05-17 MED ORDER — BUPROPION HCL ER (SR) 100 MG PO TB12
100.0000 mg | ORAL_TABLET | Freq: Every day | ORAL | 1 refills | Status: AC
Start: 2024-05-17 — End: ?

## 2024-05-17 MED ORDER — LITHIUM CARBONATE 150 MG PO CAPS: ORAL_CAPSULE | ORAL | 1 refills | Status: DC

## 2024-05-17 NOTE — Progress Notes (Signed)
 [name] [id number] Nov 12, 1955 68 y.o.    Subjective:   Patient ID:  Robin Barton is a 68 y.o. (DOB [birth year]) female.  Chief Complaint:  Chief Complaint  Patient presents with   Follow-up   Depression   Anxiety    SHERICKA JOHNSTONE presents to the office today for follow-up mood and anxiety.  seen October 2020.  No meds were changed.  She was doing well psychiatrically.  It was recommended she get a lithium  level and BMP and Continue paroxetine  20 mg daily, continue trazodone  50 mg nightly, continue lithium  150 mg alternating with 300 mg every other day.  January 17, 2020 appointment the following is noted: .Ok.  Exposed to virus twice and had to QT.  Had to hlep sister with med probvlems.   Forgot the labs.   Gotten vaccinated. Not taking Xanax .   Sleeps Ok with trazodone . Forgot to get labs after reducing the lithium .  Tremor is much better and manageable with the reduction of lithium .  No worsening of mood with the change. Tremor has gotten better on it's own.  No longer needs B6. Plan: Continue paroxetine  20 mg daily, continue trazodone  50 mg nightly, continue lithium  150 mg alternating with 300 mg every other day. Asks for alprazolam  prn anxiety.  August 2021 it was discovered she had low sodium and was just recommended she discuss with PCP.  She stopped trazodone  and her sodium level improved.  06/26/20 appt noted: Reduced paroxetine  to 10 mg from 20 mg daily and stopped trazodone . Low sodium SE caused scary neuro sx.  Had gotten confused and was having tearful episodes.  Sx had developed gradually over time.   Balance issues.   I feel so much better.    Taking salt tablet twice daily. BP is fine.   Emotionally feels good.  On a cleaning rampage at this time.    Lost a good friend to ALS.   Taking Xanax  instead of trazodone  for sleep. Plan: No med changes today. Continue lower paroxetine  10 mg daily,  continue lithium  150 mg alternating with  300 mg every other day.  Continue salt tablets Stopped trazodone  DT hyponatremia. Asks for alprazolam  prn anxiety or insomnia since off the Xanax ..  09/26/2020 appointment with following noted: Xanax  0.5 mg HS. Still on salt, paroxetine  10, lithium  300/150 QOD. Asks to take Xanax  during the day for anxiety.  Don't seem to have the drive she used to have but once done she feels great.  Some avoidance like social things.  Energy is good. Possibly more anxious at lower dose paroxetine . No crying spells.  H notices reduction in motivation and she doesn't seem like herself. Smokes and needs to stop. Plan: Reduce caffeine bc she gets anxious after taking it and has not reduced as requested. wellbutrin  XL 150 mg 1 for 1 week in the AM, then 2 each morning. Repeat serum sodium  11/13/2020 appointment with following noted: Wellbutrin  helped me settle down and reduced craving to smoke.  Never got above 150 mg XL.   SE night sweats. Still continues paroxetine  10. Not sleeping as well either with awakening with sweats.  Cut smoking way back to as little as 3-5 per day with less craving from 1/2 to 1 PPD. Emotionally felt better with better task completion.  Better motivation.  Don't feel as anxious in AM and less coffee in the AM.  Plan: Switch to Wellbutrin  SR 100 mg AM to reduce SE.  It's helping.  02/06/2021 appointment  with the following noted: Not sleeping well with night sweats awakens..Still gets at least 8 hours but prefers 10 h.  Not drowsy. Wonders if related to Wellbutrin .  Feels she needs 1 mg alprazolam  to get best response from it.  No hangover.  Felt sleep better with trazodone  but afraid of repeat hyponatremia.   Some depression.  Feels a little lost and not real motivated but not everyday.  Thinks needs job and schedule.  Good enjoyment but less motivation. Kids don't live here and VEAR Kuba gone a lot on golf trips. Antsy in the morning after up 10-15 mins and reduced caffeine and it  helped. Not anxious all day.   Wellbutrin  reduced smoking by 50%.  Don't want to give Wellbutrin  up.  No daytime XaNAX .   Plan: Switch to Wellbutrin  SR 100 mg AM to reduce SE.  It's helping. Disc smoking cessation.. Asks for alprazolam  prn anxiety or insomnia since off the trazodone . Ok increase to 0.75 mg HS Continue lower paroxetine  10 mg daily,  continue lithium  150 mg alternating with 300 mg every other day.   06/05/21 appt noted:   Fair.  Not sleeping well.  EMA and jus t lays there in the dark.  Then feels like crap the next day.  Happens 5 days per week.  Took trazdone other night without Xanax  without more help. Had slept well with trazodone . Night sweats after she awakens.  Tried black [place] intermittently.   Redeuced coffee. Smoking better but hasn't quit.  3-5 per day.   .Still having tremor Some occ dep but not usually.  .  Some anxiety over doing things..    Still feels anxious in AM same pattern..  Drinks coffee.  Never had ADD but anxious about doing things she wants to do but doesn't get done..   Denies appetite disturbance.  Patient reports that energy and motivation have been good.  Patient denies any difficulty with concentration.  Patient denies any suicidal ideation. Plan: Switch to Wellbutrin  IR 75 mg AM to reduce insomnia SE.  It's helping. Disc smoking cessation..  10/16/2021 appt noted: Fish pond with goldfish. Still not sleeping very well. Klonopin  0.75 wihtout help. Switched back to Xanax  0.75 mg HS but not as good as trazodone . Sleep adequate hours. But awaken in middle of night thinking about things. Back pain unusually and muscular.  RX Robaxin  but didn't take yet.  Has cyclobenzaprine . Sleep doesn't feel restuful. Mood OK not great.  Should exercise. Some anxiety in the AM Plan: Try cyclobenzaprine  for sleep and back pain.  She has some Increase Wellbutrin  to 100 mg AM  for mood  01/09/22 appt noted: Flexeril  didn't work for EMA with worry. Last week  restarted trazodone  50 mg HS and it is working. Taking paroxetine  10 and Wellbutrin  SR 100 AM.  Tolerated fine.   Energy pretty good. Low sodium 130.  Increased salt tablets to 2 daily.   Some anxiety in the AM. Mood up and down with down lasting 24 hours and seems related to insomnia or not. Trazodone  works better for sleep than Xanax .    03/12/2022 appointment with the following noted: Continues Wellbutrin  SR 100 mg every morning, cyclobenzaprine  10 mg nightly only rarely, lithium  300 alternating with 150 every other night, paroxetine  10 mg, trazodone  50 mg nightly Pretty good usually.   PE early June sodium up to 138.  Stopped sodium after that appt.  BC gets stuck in throat. Don't drink enough water.   Reports high liver enzyme  apparently minor bc PCP didn't order more testing. Patient reports stable mood and denies depressed or irritable moods.  Patient denies any recent difficulty with anxiety.  Patient denies difficulty with sleep initiation or maintenance. Denies appetite disturbance.  Patient reports that energy and motivation have been good.  Patient denies any difficulty with concentration.  Patient denies any suicidal ideation. Plan: No med changes Continues Wellbutrin  SR 100 mg every morning, cyclobenzaprine  10 mg nightly only rarely, lithium  300 alternating with 150 every other night, paroxetine  10 mg, trazodone  50 mg nightly Check CMP  07/16/22 appt noted: Lost paperwork for CMP. Can't swallow B6 or salt tablets.  Tried cutting both in half.  They get hung in throat. Otherwise doing great.  Patient reports stable mood and denies depressed or irritable moods.  Patient denies any recent difficulty with anxiety.  Patient denies difficulty with sleep initiation but EMA about 4 AM often.  Denies appetite disturbance.  Patient reports that energy and motivation have been good.  Patient denies any difficulty with concentration.  Patient denies any suicidal ideation. Sleep better with  Xanax  than trazodone . Not using alprazolam  for anxiety. Plan: No med changes except sleeper: Continues Wellbutrin  SR 100 mg every morning, cyclobenzaprine  10 mg nightly only rarely, lithium  300 alternating with 150 every other night, paroxetine  10 mg,  DT low sodium try stopping trazodone  and use alprazolam  prn  11/13/22 appt noted: Avg 8 hours.  On trazodone . Not depressed .  Some days feels bored.  Not much on schedule to occupy her.  Does help some older women in neighborhood.  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 Needs to start exercise.. First GS in [place].  Don't get to see them much.  Born in Sept.  No SE issues with meds.   Watching coffee intake. No xanax  lately.  04/29/23 appt noted:  Mood about the same.  Energy varies and sometimes blah but not always. Low B12 and just started supplement. No consistent anxiety but some mornings worries.  Other mornings are oK. Sleep is fair.  Awakening and then trouble going back to sleep Typical worries with son with baby.  Son unhappy with job.  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    09/04/23 appt noted:  Moved up appt. Psych meds: Alprazolam  0.5 mg twice daily as needed rarely, Wellbutrin  SR 100 daily, lithium  150-300 every other day, paroxetine  10 daily, trazodone  50-100 nightly. Taking care of Arley with [facility].  Tripped over his walker and broke her wrist.   He said it was bc of too much wine but only had 2 glasses.   Kids confronted her about her drinking and won't let her keep grandchild bc of it. Did start drinking more during Covid and lost sleeper.  Started drinking at 4 in afternoon.  Kids say she repeats herself I the evening after drinking.    Not missing out on commitments.   Average 3 glasses per night wine forever.  No other alcohol . During Covid probably drank more.   Sleep 9-6 AM H difficult and contributes to drinking.    11/05/23 appt noted: Psych meds as above. Measuring out alcohol  and I think it's helped me.   I feel better.  More energy.  Better mood also.  No sig dep.  Measuring 3 oz wine nightly.  Drinking more water.  Some feedback from [place].  H noticed better.  Kids not living here and don't know.   Successful moderating.   Sleep ok with trazodone .  Variable.  Wakes at  4 and will start thinking .  Otherwise sleeps well. Tolerating meds.   FU 3-4 mos re: alcohol  use  02/03/24 appt noted: Med:  Wellbutrin  SR 100 mg every morning, cyclobenzaprine  10 mg nightly only rarely,  lithium  300 alternating with 150 every other night, paroxetine  10 mg,  trazodone  50 mg HS, sodium tablet daily, option increase Moderation of alcohol  successful.  I feel about the same some days better than others.  Alsways feel like I could use more energy. Working on cholesteraol.   Sleep off and on ok with trazodone . Wants to resume B12 shots.    05/17/24 appt noted:  Med:  Wellbutrin  SR 100 mg every morning, cyclobenzaprine  10 mg nightly only rarely,  lithium  300 alternating with 150 every other night, paroxetine  10 mg,  trazodone  50 mg HS, sodium tablet daily, option increase Med changes.  Stopped statin Still having terrible leg px aching, throbbing.  Doesn't seem to be RLS.   Can go to sleep but awakens 4-5 AM bc can't get comfortable.  Off statin 5-6 weeks.   Ok mentally.  Satisfied with meds.   Got though summer without bad dep is good.    Past Psychiatric Medication Trials:  paroxetine , several intolerant SSRI's Wellbutrin  SR 100 ? ins lithium  Xanax ,  trazodone  low sodium, ok right now.  History of Neuro eval for tremor.  F & GM alcoholic  Review of Systems:  Review of Systems  Constitutional:  Negative for fatigue.  Cardiovascular:  Negative for palpitations.  Musculoskeletal:  Positive for back pain and myalgias.  Neurological:  Negative for dizziness and tremors.  Psychiatric/Behavioral:  Negative for agitation, behavioral problems, confusion, decreased concentration, dysphoric mood,  hallucinations, self-injury, sleep disturbance and suicidal ideas. The patient is not nervous/anxious and is not hyperactive.     Medications: I have reviewed the patient's current medications.  Current Outpatient Medications  Medication Sig Dispense Refill   ALPRAZolam  (XANAX ) 0.5 MG tablet Take 1 tablet (0.5 mg total) by mouth 2 (two) times daily as needed for anxiety. 45 tablet 1   aspirin EC 81 MG tablet Take 81 mg by mouth daily. Swallow whole.     carvedilol  (COREG ) 3.125 MG tablet Take 1 tablet (3.125 mg total) by mouth 2 (two) times daily with a meal. 180 tablet 2   Cholecalciferol (VITAMIN D3) 1.25 MG (50000 UT) CAPS every 14 (fourteen) days.      cyanocobalamin  (VITAMIN B12) 1000 MCG/ML injection Inject 1 mL (1,000 mcg total) into the muscle once a week. 4 mL 0   cyanocobalamin  (VITAMIN B12) 1000 MCG/ML injection Inject 1 mL (1,000 mcg total) into the muscle every 30 (thirty) days. 6 mL 5   Evolocumab  (REPATHA  SURECLICK) 140 MG/ML SOAJ Inject 140 mg into the skin every 14 (fourteen) days. 6 mL 3   ibuprofen  (ADVIL ) 800 MG tablet Take 800 mg by mouth 3 (three) times daily.     lisinopril  (ZESTRIL ) 40 MG tablet Take 1 tablet (40 mg total) by mouth daily. (Patient taking differently: Take 20 mg by mouth daily.) 90 tablet 3   loperamide  (IMODIUM  A-D) 2 MG tablet Take 1 tablet (2 mg total) by mouth as needed for diarrhea or loose stools. 30 tablet 0   magnesium oxide (MAG-OX) 400 MG tablet Take 400 mg by mouth daily.     nicotine  (NICODERM CQ ) 14 mg/24hr patch Place 1 patch (14 mg total) onto the skin daily. 30 patch 3   Pyridoxine HCl (VITAMIN B-6) 500 MG tablet Take 500 mg by  mouth as needed.     SODIUM CHLORIDE  PO Take by mouth in the morning and at bedtime.     buPROPion  ER (WELLBUTRIN  SR) 100 MG 12 hr tablet Take 1 tablet (100 mg total) by mouth daily. 90 tablet 1   lithium  carbonate 150 MG capsule ALTERNATE TAKING 2 CAPSULES AND 1 CAPSULES BY MOUTH EVERY OTHER NIGHT 135 capsule 1    PARoxetine  (PAXIL ) 10 MG tablet Take 1 tablet (10 mg total) by mouth daily. 90 tablet 1   traZODone  (DESYREL ) 50 MG tablet TAKE 1 TO 2 TABLETS BY MOUTH AT NIGHT AS NEEDED FOR SLEEP 135 tablet 0   No current facility-administered medications for this visit.    Medication Side Effects: Other: tremor recently better  Allergies:  Allergies  Allergen Reactions   Sulfonamide Derivatives Hives, Itching and Rash    Past Medical History:  Diagnosis Date   Acid reflux    Anxiety    Atypical lobular hyperplasia of left breast 11/25/13   lumpectomy, 12/10/13   BCC (basal cell carcinoma)    per pt, never had cancer   Depression    Hyperlipidemia    Hypertension    Osteopenia    Thyroid  disease 2010   rt thyroid  nodule    Family History  Problem Relation Age of Onset   Cancer Mother        colon-stage 4   Hypertension Mother    Colon cancer Mother    Other Father    Breast cancer Sister    Colon polyps Neg Hx    Esophageal cancer Neg Hx    Rectal cancer Neg Hx    Stomach cancer Neg Hx     Social History   Socioeconomic History   Marital status: Married    Spouse name: Not on file   Number of children: 2   Years of education: Not on file   Highest education level: Not on file  Occupational History   Not on file  Tobacco Use   Smoking status: Some Days    Current packs/day: 0.50    Average packs/day: 0.5 packs/day for 35.0 years (17.5 ttl pk-yrs)    Types: Cigarettes   Smokeless tobacco: Never  Vaping Use   Vaping status: Former  Substance and Sexual Activity   Alcohol  use: Yes    Alcohol /week: 12.0 - 14.0 standard drinks of alcohol     Types: 12 - 14 Glasses of wine per week   Drug use: No   Sexual activity: Yes    Partners: Male    Birth control/protection: Post-menopausal  Other Topics Concern   Not on file  Social History Narrative   Not on file   Social Drivers of Health   Financial Resource Strain: Not on file  Food Insecurity: Not on file   Transportation Needs: Not on file  Physical Activity: Not on file  Stress: Not on file  Social Connections: Not on file  Intimate Partner Violence: Not on file    Past Medical History, Surgical history, Social history, and Family history were reviewed and updated as appropriate.   Please see review of systems for further details on the patient's review from today.   Objective:   Physical Exam:  LMP 09/24/1999   Physical Exam Constitutional:      General: She is not in acute distress.    Appearance: She is well-developed.  Musculoskeletal:        General: No deformity.  Neurological:     Mental Status: She is  alert and oriented to person, place, and time.     Cranial Nerves: No dysarthria.     Coordination: Coordination normal.  Psychiatric:        Attention and Perception: Attention and perception normal. She does not perceive auditory or visual hallucinations.        Mood and Affect: Mood is anxious and depressed. Affect is not labile, flat, angry or inappropriate.        Speech: Speech normal. Speech is not rapid and pressured or slurred.        Behavior: Behavior normal. Behavior is cooperative.        Thought Content: Thought content normal. Thought content is not paranoid or delusional. Thought content does not include homicidal or suicidal ideation. Thought content does not include suicidal plan.        Cognition and Memory: Cognition and memory normal.        Judgment: Judgment normal.     Comments: Insight good No evidence for cognitive issues. Some concerns about herself as noted.      Lab Review:     Component Value Date/Time   NA 134 (L) 11/27/2023 1421   NA 138 05/05/2023 1420   NA 139 07/19/2014 0903   K 4.8 11/27/2023 1421   K 4.7 07/19/2014 0903   CL 103 11/27/2023 1421   CO2 23 11/27/2023 1421   CO2 24 07/19/2014 0903   GLUCOSE 98 11/27/2023 1421   GLUCOSE 95 07/19/2014 0903   BUN 25 11/27/2023 1421   BUN 24 05/05/2023 1420   BUN 22.8  07/19/2014 0903   CREATININE 0.91 11/27/2023 1421   CREATININE 0.8 07/19/2014 0903   CALCIUM  9.8 11/27/2023 1421   CALCIUM  9.6 07/19/2014 0903   PROT 7.2 02/27/2024 1117   PROT 7.3 07/19/2014 0903   ALBUMIN 4.7 02/27/2024 1117   ALBUMIN 4.1 07/19/2014 0903   AST 48 (H) 02/27/2024 1117   AST 23 07/19/2014 0903   ALT 38 (H) 02/27/2024 1117   ALT 21 07/19/2014 0903   ALKPHOS 86 02/27/2024 1117   ALKPHOS 54 07/19/2014 0903   BILITOT 0.5 02/27/2024 1117   BILITOT 0.49 07/19/2014 0903   GFRNONAA 53 (L) 06/18/2018 0934   GFRAA 61 06/18/2018 0934       Component Value Date/Time   WBC 7.4 01/22/2023 1457   RBC 3.23 (L) 01/22/2023 1457   HGB 11.2 (L) 01/22/2023 1457   HGB 12.5 07/19/2014 0903   HCT 32.8 (L) 01/22/2023 1457   HCT 37.8 07/19/2014 0903   PLT 423.0 (H) 01/22/2023 1457   PLT 370 07/19/2014 0903   MCV 101.4 (H) 01/22/2023 1457   MCV 96.0 07/19/2014 0903   MCH 31.8 07/19/2014 0903   MCH 31.9 04/05/2011 0923   MCHC 34.2 01/22/2023 1457   RDW 12.8 01/22/2023 1457   RDW 12.8 07/19/2014 0903   LYMPHSABS 1.5 01/22/2023 1457   LYMPHSABS 1.5 07/19/2014 0903   MONOABS 0.9 01/22/2023 1457   MONOABS 0.6 07/19/2014 0903   EOSABS 0.2 01/22/2023 1457   EOSABS 0.1 07/19/2014 0903   BASOSABS 0.1 01/22/2023 1457   BASOSABS 0.0 07/19/2014 0903    Lithium  Lvl  Date Value Ref Range Status  08/02/2022 0.5 (L) 0.6 - 1.2 mmol/L Final     No results found for: PHENYTOIN, PHENOBARB, VALPROATE, CBMZ   .res Assessment: Plan:    Major depressive disorder, recurrent episode, moderate (HCC) - Plan: buPROPion  ER (WELLBUTRIN  SR) 100 MG 12 hr tablet, lithium  carbonate 150 MG capsule, PARoxetine  (PAXIL )  10 MG tablet, Basic metabolic panel  Alcohol  abuse with intoxication (HCC)  Generalized anxiety disorder - Plan: PARoxetine  (PAXIL ) 10 MG tablet  Insomnia due to mental condition - Plan: traZODone  (DESYREL ) 50 MG tablet  Hyponatremia  Lithium  use - Plan: Basic metabolic  panel  Please see After Visit Summary for patient specific instructions.  Overall she remained stable after the reduction in both lithium  and paroxetine  to current dosages.  Better motivation and task completion and less msoking with Wellbutrin .  30 min counseling: Extensive discussion of alcohol  use and concerns she has and family about it's excess.  Does not appear at risk of sig withdrawal nor need for external detox.  Disc SS alcohol  withdrawal in case she has understated the amount she is drinking.  Disc CBT strategies to stop excess drinking.  Uncertain if she can moderate.  Disc strategies and if this fails then will need to pursue abstinence.   Offered additional counseling and resources for this.  We discussed her history of response to lithium  at the lower dose 150 and then the 300 mg a day.  We discussed her side effects of tremor.  It's better now.   The tremor is better with the reduction to the current dose of lithium  and the use of B6.  It is now manageable.  Disc option stopping B6 to see if she still needs it. Last lithium  level is low normal.  We discussed the question of her diagnosis given at prior history of 2 weeks of hypomania but that may have been related to medications.  The uncertainty whether she truly has major depression versus bipolar disorder remains.  We discussed the risk of antidepressants causing mood cycling and the advantage to being on a lower dose.  Answered questions about each med and DDI and risks to liver and kidney  Continue lower paroxetine  10 mg daily,    continue lithium  150 mg alternating with 300 mg every other day.  Counseled patient regarding potential benefits, risks, and side effects of lithium  to include potential risk of lithium  affecting thyroid  and renal function.  Discussed need for periodic lab monitoring to determine drug level and to assess for potential adverse effects.  Counseled patient regarding signs and symptoms of lithium  toxicity  and advised that they notify office immediately or seek urgent medical attention if experiencing these signs and symptoms.  Patient advised to contact office with any questions or concerns. B6 helped lithium  tremor  Trial black cohush.  continue Wellbutrin  to 100 mg AM  for mood.  Consider increase if needed. We discussed the short-term risks associated with benzodiazepines including sedation and increased fall risk among others.  Discussed long-term side effect risk including dependence, potential withdrawal symptoms, and the potential eventual dose-related risk of dementia.  But recent studies from 2020 dispute this association between benzodiazepines and dementia risk. Newer studies in 2020 do not support an association with dementia. Disc SE worsen with age.  Disc smoking cessation.. Watch caffeine for tremor.  She has done this.  No med changes :  Continues Wellbutrin  SR 100 mg every morning,  cyclobenzaprine  10 mg nightly only rarely,  lithium  300 alternating with 150 every other night,  paroxetine  10 mg,  trazodone  50 mg HS, sodium tablet daily, option increase Renew B12 and check level.  Hx low sodium Last sodium 134 11/2023   Repeat sodium level.  Disc therapist options;  Counselor options:  Deane Sprang, Marko Crutchfield  Follow-up 4 mos  Lorene Macintosh MD, DFAPA\  No future appointments.     Orders Placed This Encounter  Procedures   Basic metabolic panel      -------------------------------

## 2024-05-18 ENCOUNTER — Ambulatory Visit: Payer: Self-pay | Admitting: Psychiatry

## 2024-05-18 LAB — VITAMIN B12: Vitamin B-12: 473 pg/mL (ref 200–1100)

## 2024-05-18 LAB — BASIC METABOLIC PANEL WITH GFR
BUN/Creatinine Ratio: 24 (calc) — ABNORMAL HIGH (ref 6–22)
BUN: 29 mg/dL — ABNORMAL HIGH (ref 7–25)
CO2: 21 mmol/L (ref 20–32)
Calcium: 9.6 mg/dL (ref 8.6–10.4)
Chloride: 104 mmol/L (ref 98–110)
Creat: 1.19 mg/dL — ABNORMAL HIGH (ref 0.60–1.00)
Glucose, Bld: 82 mg/dL (ref 65–99)
Potassium: 4.7 mmol/L (ref 3.5–5.3)
Sodium: 136 mmol/L (ref 135–146)
eGFR: 49 mL/min/1.73m2 — ABNORMAL LOW (ref >=60)

## 2024-05-18 LAB — LITHIUM LEVEL: Lithium Lvl: 0.5 mmol/L — ABNORMAL LOW (ref 0.6–1.2)

## 2024-05-27 ENCOUNTER — Encounter (HOSPITAL_BASED_OUTPATIENT_CLINIC_OR_DEPARTMENT_OTHER): Payer: Self-pay | Admitting: Obstetrics & Gynecology

## 2024-05-27 DIAGNOSIS — E7849 Other hyperlipidemia: Secondary | ICD-10-CM | POA: Diagnosis not present

## 2024-05-27 DIAGNOSIS — R928 Other abnormal and inconclusive findings on diagnostic imaging of breast: Secondary | ICD-10-CM | POA: Diagnosis not present

## 2024-05-27 LAB — LIPID PANEL

## 2024-05-28 ENCOUNTER — Ambulatory Visit: Payer: Self-pay | Admitting: Pharmacist

## 2024-05-28 LAB — HEPATIC FUNCTION PANEL
ALT: 32 IU/L (ref 0–32)
AST: 40 IU/L (ref 0–40)
Albumin: 4.7 g/dL (ref 3.9–4.9)
Alkaline Phosphatase: 69 IU/L (ref 44–121)
Bilirubin Total: 0.6 mg/dL (ref 0.0–1.2)
Bilirubin, Direct: 0.25 mg/dL (ref 0.00–0.40)
Total Protein: 7.2 g/dL (ref 6.0–8.5)

## 2024-05-28 LAB — LIPID PANEL
Cholesterol, Total: 218 mg/dL — AB (ref 100–199)
HDL: 138 mg/dL (ref >39)
LDL CALC COMMENT:: 1.6 ratio (ref 0.0–4.4)
LDL Chol Calc (NIH): 71 mg/dL (ref 0–99)
Triglycerides: 47 mg/dL (ref 0–149)
VLDL Cholesterol Cal: 9 mg/dL (ref 5–40)

## 2024-05-28 NOTE — Telephone Encounter (Signed)
 Noted after sending MyChart Pt is not on Praluent but on Repatha . Call and inform to continue on Repatha  140 mg every 14 days.

## 2024-06-11 DIAGNOSIS — E785 Hyperlipidemia, unspecified: Secondary | ICD-10-CM | POA: Diagnosis not present

## 2024-06-11 DIAGNOSIS — N1831 Chronic kidney disease, stage 3a: Secondary | ICD-10-CM | POA: Diagnosis not present

## 2024-06-11 DIAGNOSIS — E875 Hyperkalemia: Secondary | ICD-10-CM | POA: Diagnosis not present

## 2024-06-11 DIAGNOSIS — I129 Hypertensive chronic kidney disease with stage 1 through stage 4 chronic kidney disease, or unspecified chronic kidney disease: Secondary | ICD-10-CM | POA: Diagnosis not present

## 2024-06-11 DIAGNOSIS — E041 Nontoxic single thyroid nodule: Secondary | ICD-10-CM | POA: Diagnosis not present

## 2024-06-11 DIAGNOSIS — E559 Vitamin D deficiency, unspecified: Secondary | ICD-10-CM | POA: Diagnosis not present

## 2024-06-18 DIAGNOSIS — D369 Benign neoplasm, unspecified site: Secondary | ICD-10-CM | POA: Diagnosis not present

## 2024-06-18 DIAGNOSIS — M8580 Other specified disorders of bone density and structure, unspecified site: Secondary | ICD-10-CM | POA: Diagnosis not present

## 2024-06-18 DIAGNOSIS — F17200 Nicotine dependence, unspecified, uncomplicated: Secondary | ICD-10-CM | POA: Diagnosis not present

## 2024-06-18 DIAGNOSIS — F330 Major depressive disorder, recurrent, mild: Secondary | ICD-10-CM | POA: Diagnosis not present

## 2024-06-18 DIAGNOSIS — D0502 Lobular carcinoma in situ of left breast: Secondary | ICD-10-CM | POA: Diagnosis not present

## 2024-06-18 DIAGNOSIS — I10 Essential (primary) hypertension: Secondary | ICD-10-CM | POA: Diagnosis not present

## 2024-06-18 DIAGNOSIS — Z0000 Encounter for general adult medical examination without abnormal findings: Secondary | ICD-10-CM | POA: Diagnosis not present

## 2024-06-18 DIAGNOSIS — E785 Hyperlipidemia, unspecified: Secondary | ICD-10-CM | POA: Diagnosis not present

## 2024-06-18 DIAGNOSIS — Z1339 Encounter for screening examination for other mental health and behavioral disorders: Secondary | ICD-10-CM | POA: Diagnosis not present

## 2024-06-18 DIAGNOSIS — Z800 Family history of malignant neoplasm of digestive organs: Secondary | ICD-10-CM | POA: Diagnosis not present

## 2024-06-18 DIAGNOSIS — N1831 Chronic kidney disease, stage 3a: Secondary | ICD-10-CM | POA: Diagnosis not present

## 2024-06-18 DIAGNOSIS — I129 Hypertensive chronic kidney disease with stage 1 through stage 4 chronic kidney disease, or unspecified chronic kidney disease: Secondary | ICD-10-CM | POA: Diagnosis not present

## 2024-07-31 ENCOUNTER — Other Ambulatory Visit: Payer: Self-pay | Admitting: Pharmacist

## 2024-10-02 ENCOUNTER — Other Ambulatory Visit: Payer: Self-pay | Admitting: Psychiatry

## 2024-10-02 DIAGNOSIS — F5105 Insomnia due to other mental disorder: Secondary | ICD-10-CM

## 2024-10-02 DIAGNOSIS — F331 Major depressive disorder, recurrent, moderate: Secondary | ICD-10-CM

## 2024-10-07 ENCOUNTER — Other Ambulatory Visit: Payer: Self-pay | Admitting: Cardiovascular Disease

## 2024-10-07 DIAGNOSIS — Z720 Tobacco use: Secondary | ICD-10-CM

## 2024-10-07 DIAGNOSIS — E7800 Pure hypercholesterolemia, unspecified: Secondary | ICD-10-CM

## 2024-10-07 DIAGNOSIS — I2510 Atherosclerotic heart disease of native coronary artery without angina pectoris: Secondary | ICD-10-CM

## 2024-10-07 DIAGNOSIS — I10 Essential (primary) hypertension: Secondary | ICD-10-CM

## 2024-10-08 NOTE — Telephone Encounter (Signed)
 Creatine outside of Normal Range  In accordance with refill protocols, please review and address the following requirements before this medication refill can be authorized:  Labs

## 2024-11-16 ENCOUNTER — Ambulatory Visit (INDEPENDENT_AMBULATORY_CARE_PROVIDER_SITE_OTHER): Admitting: Psychiatry
